# Patient Record
Sex: Female | Born: 1950
Health system: Southern US, Community
[De-identification: ages and names within clinical notes are randomized; demographics above are authoritative.]

## PROBLEM LIST (undated history)

## (undated) DIAGNOSIS — J309 Allergic rhinitis, unspecified: Secondary | ICD-10-CM

## (undated) DIAGNOSIS — J9600 Acute respiratory failure, unspecified whether with hypoxia or hypercapnia: Secondary | ICD-10-CM

## (undated) DIAGNOSIS — M545 Low back pain, unspecified: Secondary | ICD-10-CM

## (undated) DIAGNOSIS — C50919 Malignant neoplasm of unspecified site of unspecified female breast: Secondary | ICD-10-CM

## (undated) DIAGNOSIS — J189 Pneumonia, unspecified organism: Secondary | ICD-10-CM

## (undated) DIAGNOSIS — L409 Psoriasis, unspecified: Secondary | ICD-10-CM

## (undated) DIAGNOSIS — Z720 Tobacco use: Secondary | ICD-10-CM

## (undated) DIAGNOSIS — D649 Anemia, unspecified: Secondary | ICD-10-CM

## (undated) DIAGNOSIS — R600 Localized edema: Secondary | ICD-10-CM

## (undated) DIAGNOSIS — J449 Chronic obstructive pulmonary disease, unspecified: Secondary | ICD-10-CM

## (undated) DIAGNOSIS — H3530 Unspecified macular degeneration: Secondary | ICD-10-CM

## (undated) DIAGNOSIS — M1990 Unspecified osteoarthritis, unspecified site: Secondary | ICD-10-CM

## (undated) DIAGNOSIS — G8929 Other chronic pain: Secondary | ICD-10-CM

## (undated) DIAGNOSIS — R635 Abnormal weight gain: Secondary | ICD-10-CM

## (undated) DIAGNOSIS — I10 Essential (primary) hypertension: Secondary | ICD-10-CM

## (undated) DIAGNOSIS — R5383 Other fatigue: Secondary | ICD-10-CM

## (undated) DIAGNOSIS — I82409 Acute embolism and thrombosis of unspecified deep veins of unspecified lower extremity: Secondary | ICD-10-CM

## (undated) DIAGNOSIS — E8809 Other disorders of plasma-protein metabolism, not elsewhere classified: Secondary | ICD-10-CM

## (undated) HISTORY — PX: APPENDECTOMY: SHX54

## (undated) HISTORY — PX: TUBAL LIGATION: SHX77

## (undated) HISTORY — PX: JOINT REPLACEMENT: SHX530

## (undated) HISTORY — PX: KNEE ARTHROSCOPY: SHX127

## (undated) HISTORY — DX: Psoriasis, unspecified: L40.9

## (undated) HISTORY — PX: TONSILLECTOMY: SUR1361

## (undated) HISTORY — DX: Unspecified macular degeneration: H35.30

---

## 1993-09-13 HISTORY — PX: TOTAL KNEE ARTHROPLASTY: SHX125

## 1995-09-14 DIAGNOSIS — C50919 Malignant neoplasm of unspecified site of unspecified female breast: Secondary | ICD-10-CM

## 1995-09-14 HISTORY — DX: Malignant neoplasm of unspecified site of unspecified female breast: C50.919

## 1995-09-14 HISTORY — PX: BREAST LUMPECTOMY: SHX2

## 1998-07-07 ENCOUNTER — Encounter
Admission: RE | Admit: 1998-07-07 | Discharge: 1998-10-05 | Payer: Self-pay | Admitting: Physical Medicine & Rehabilitation

## 1998-08-13 ENCOUNTER — Encounter: Payer: Self-pay | Admitting: Family Medicine

## 1998-08-13 ENCOUNTER — Ambulatory Visit (HOSPITAL_COMMUNITY): Admission: RE | Admit: 1998-08-13 | Discharge: 1998-08-13 | Payer: Self-pay | Admitting: Family Medicine

## 1998-08-25 ENCOUNTER — Encounter
Admission: RE | Admit: 1998-08-25 | Discharge: 1998-11-23 | Payer: Self-pay | Admitting: Physical Medicine & Rehabilitation

## 1998-12-09 ENCOUNTER — Ambulatory Visit (HOSPITAL_COMMUNITY): Admission: RE | Admit: 1998-12-09 | Discharge: 1998-12-09 | Payer: Self-pay | Admitting: *Deleted

## 1998-12-09 ENCOUNTER — Encounter: Payer: Self-pay | Admitting: *Deleted

## 1999-08-07 ENCOUNTER — Ambulatory Visit (HOSPITAL_COMMUNITY): Admission: RE | Admit: 1999-08-07 | Discharge: 1999-08-07 | Payer: Self-pay | Admitting: *Deleted

## 1999-08-07 ENCOUNTER — Encounter: Payer: Self-pay | Admitting: *Deleted

## 2000-02-02 ENCOUNTER — Emergency Department (HOSPITAL_COMMUNITY): Admission: EM | Admit: 2000-02-02 | Discharge: 2000-02-03 | Payer: Self-pay | Admitting: *Deleted

## 2000-02-03 ENCOUNTER — Encounter: Payer: Self-pay | Admitting: Emergency Medicine

## 2000-02-24 ENCOUNTER — Ambulatory Visit (HOSPITAL_COMMUNITY): Admission: RE | Admit: 2000-02-24 | Discharge: 2000-02-24 | Payer: Self-pay | Admitting: *Deleted

## 2000-02-24 ENCOUNTER — Encounter: Payer: Self-pay | Admitting: *Deleted

## 2000-08-25 ENCOUNTER — Other Ambulatory Visit: Admission: RE | Admit: 2000-08-25 | Discharge: 2000-08-25 | Payer: Self-pay | Admitting: *Deleted

## 2001-03-31 ENCOUNTER — Encounter: Payer: Self-pay | Admitting: *Deleted

## 2001-03-31 ENCOUNTER — Ambulatory Visit (HOSPITAL_COMMUNITY): Admission: RE | Admit: 2001-03-31 | Discharge: 2001-03-31 | Payer: Self-pay | Admitting: *Deleted

## 2001-12-05 ENCOUNTER — Other Ambulatory Visit: Admission: RE | Admit: 2001-12-05 | Discharge: 2001-12-05 | Payer: Self-pay | Admitting: *Deleted

## 2002-01-23 ENCOUNTER — Ambulatory Visit (HOSPITAL_COMMUNITY): Admission: RE | Admit: 2002-01-23 | Discharge: 2002-01-23 | Payer: Self-pay | Admitting: Cardiology

## 2002-01-23 ENCOUNTER — Encounter: Payer: Self-pay | Admitting: Cardiology

## 2002-08-17 ENCOUNTER — Ambulatory Visit (HOSPITAL_COMMUNITY): Admission: RE | Admit: 2002-08-17 | Discharge: 2002-08-17 | Payer: Self-pay | Admitting: *Deleted

## 2002-08-17 ENCOUNTER — Encounter: Payer: Self-pay | Admitting: *Deleted

## 2002-11-26 ENCOUNTER — Encounter: Admission: RE | Admit: 2002-11-26 | Discharge: 2003-02-24 | Payer: Self-pay | Admitting: *Deleted

## 2002-11-29 ENCOUNTER — Encounter: Admission: RE | Admit: 2002-11-29 | Discharge: 2002-11-29 | Payer: Self-pay | Admitting: *Deleted

## 2002-12-04 ENCOUNTER — Encounter: Admission: RE | Admit: 2002-12-04 | Discharge: 2002-12-04 | Payer: Self-pay | Admitting: *Deleted

## 2003-01-12 HISTORY — PX: ROUX-EN-Y GASTRIC BYPASS: SHX1104

## 2003-01-30 ENCOUNTER — Ambulatory Visit: Admission: RE | Admit: 2003-01-30 | Discharge: 2003-01-30 | Payer: Self-pay | Admitting: *Deleted

## 2003-02-04 ENCOUNTER — Ambulatory Visit (HOSPITAL_COMMUNITY): Admission: RE | Admit: 2003-02-04 | Discharge: 2003-02-04 | Payer: Self-pay | Admitting: *Deleted

## 2003-02-05 ENCOUNTER — Inpatient Hospital Stay (HOSPITAL_COMMUNITY): Admission: RE | Admit: 2003-02-05 | Discharge: 2003-02-07 | Payer: Self-pay | Admitting: *Deleted

## 2003-02-19 ENCOUNTER — Ambulatory Visit (HOSPITAL_COMMUNITY): Admission: RE | Admit: 2003-02-19 | Discharge: 2003-02-19 | Payer: Self-pay | Admitting: *Deleted

## 2003-02-25 ENCOUNTER — Ambulatory Visit (HOSPITAL_COMMUNITY): Admission: RE | Admit: 2003-02-25 | Discharge: 2003-02-25 | Payer: Self-pay | Admitting: *Deleted

## 2003-05-28 ENCOUNTER — Encounter: Admission: RE | Admit: 2003-05-28 | Discharge: 2003-08-26 | Payer: Self-pay | Admitting: *Deleted

## 2003-07-15 ENCOUNTER — Other Ambulatory Visit: Admission: RE | Admit: 2003-07-15 | Discharge: 2003-07-15 | Payer: Self-pay | Admitting: *Deleted

## 2003-08-21 ENCOUNTER — Ambulatory Visit (HOSPITAL_COMMUNITY): Admission: RE | Admit: 2003-08-21 | Discharge: 2003-08-21 | Payer: Self-pay | Admitting: *Deleted

## 2004-05-04 ENCOUNTER — Ambulatory Visit (HOSPITAL_COMMUNITY): Admission: RE | Admit: 2004-05-04 | Discharge: 2004-05-04 | Payer: Self-pay | Admitting: Orthopaedic Surgery

## 2004-08-27 ENCOUNTER — Inpatient Hospital Stay (HOSPITAL_COMMUNITY): Admission: RE | Admit: 2004-08-27 | Discharge: 2004-08-31 | Payer: Self-pay | Admitting: Orthopaedic Surgery

## 2005-03-30 ENCOUNTER — Other Ambulatory Visit: Admission: RE | Admit: 2005-03-30 | Discharge: 2005-03-30 | Payer: Self-pay | Admitting: *Deleted

## 2005-06-17 ENCOUNTER — Ambulatory Visit (HOSPITAL_COMMUNITY): Admission: RE | Admit: 2005-06-17 | Discharge: 2005-06-17 | Payer: Self-pay | Admitting: *Deleted

## 2005-06-28 ENCOUNTER — Encounter: Admission: RE | Admit: 2005-06-28 | Discharge: 2005-06-28 | Payer: Self-pay | Admitting: *Deleted

## 2006-08-01 ENCOUNTER — Ambulatory Visit (HOSPITAL_COMMUNITY): Admission: RE | Admit: 2006-08-01 | Discharge: 2006-08-01 | Payer: Self-pay | Admitting: *Deleted

## 2006-10-21 ENCOUNTER — Emergency Department (HOSPITAL_COMMUNITY): Admission: EM | Admit: 2006-10-21 | Discharge: 2006-10-21 | Payer: Self-pay | Admitting: Emergency Medicine

## 2007-08-25 ENCOUNTER — Ambulatory Visit (HOSPITAL_COMMUNITY): Admission: RE | Admit: 2007-08-25 | Discharge: 2007-08-25 | Payer: Self-pay | Admitting: *Deleted

## 2008-05-07 ENCOUNTER — Other Ambulatory Visit: Admission: RE | Admit: 2008-05-07 | Discharge: 2008-05-07 | Payer: Self-pay | Admitting: Gynecology

## 2008-09-23 ENCOUNTER — Ambulatory Visit (HOSPITAL_COMMUNITY): Admission: RE | Admit: 2008-09-23 | Discharge: 2008-09-23 | Payer: Self-pay | Admitting: Gynecology

## 2008-12-06 ENCOUNTER — Ambulatory Visit: Payer: Self-pay | Admitting: Family Medicine

## 2008-12-06 DIAGNOSIS — B070 Plantar wart: Secondary | ICD-10-CM | POA: Insufficient documentation

## 2008-12-06 DIAGNOSIS — Z853 Personal history of malignant neoplasm of breast: Secondary | ICD-10-CM | POA: Insufficient documentation

## 2008-12-06 DIAGNOSIS — L408 Other psoriasis: Secondary | ICD-10-CM | POA: Insufficient documentation

## 2009-08-14 ENCOUNTER — Ambulatory Visit: Payer: Self-pay | Admitting: Family Medicine

## 2009-08-14 DIAGNOSIS — F17200 Nicotine dependence, unspecified, uncomplicated: Secondary | ICD-10-CM | POA: Insufficient documentation

## 2009-08-14 DIAGNOSIS — I10 Essential (primary) hypertension: Secondary | ICD-10-CM | POA: Insufficient documentation

## 2009-08-14 DIAGNOSIS — R635 Abnormal weight gain: Secondary | ICD-10-CM

## 2009-08-14 DIAGNOSIS — J309 Allergic rhinitis, unspecified: Secondary | ICD-10-CM | POA: Insufficient documentation

## 2009-08-14 HISTORY — DX: Abnormal weight gain: R63.5

## 2009-08-14 LAB — CONVERTED CEMR LAB
BUN: 13 mg/dL (ref 6–23)
CO2: 27 meq/L (ref 19–32)
Calcium: 8.9 mg/dL (ref 8.4–10.5)
Chloride: 103 meq/L (ref 96–112)
Cholesterol: 204 mg/dL — ABNORMAL HIGH (ref 0–200)
Creatinine, Ser: 0.66 mg/dL (ref 0.40–1.20)
Glucose, Bld: 86 mg/dL (ref 70–99)
HCT: 41.4 % (ref 36.0–46.0)
HDL: 55 mg/dL (ref >39)
Hemoglobin: 13 g/dL (ref 12.0–15.0)
LDL Cholesterol: 123 mg/dL — ABNORMAL HIGH (ref 0–99)
MCHC: 31.4 g/dL (ref 30.0–36.0)
MCV: 90 fL (ref 78.0–100.0)
Platelets: 241 10*3/uL (ref 150–400)
Potassium: 4.2 meq/L (ref 3.5–5.3)
RBC: 4.6 M/uL (ref 3.87–5.11)
RDW: 14.6 % (ref 11.5–15.5)
Sodium: 142 meq/L (ref 135–145)
TSH: 2.427 microintl units/mL (ref 0.350–4.500)
Total CHOL/HDL Ratio: 3.7
Triglycerides: 128 mg/dL (ref <150)
VLDL: 26 mg/dL (ref 0–40)
WBC: 8 10*3/uL (ref 4.0–10.5)

## 2009-08-15 ENCOUNTER — Encounter: Payer: Self-pay | Admitting: Family Medicine

## 2010-10-15 NOTE — Assessment & Plan Note (Signed)
Summary: new pt/AC   Vital Signs:  Patient profile:   60 year old female Height:      65 inches Weight:      225.8 pounds BMI:     37.71 Pulse rate:   86 / minute Pulse rhythm:   regular BP sitting:   154 / 90  (right arm)  Vitals Entered By: Modesta Messing LPN (December 06, 2008 4:30 PM) Is Patient Diabetic? No Pain Assessment Patient in pain? no        History of Present Illness: Patient new to our office, comes in with complaint of two to three months of Right heel pain.  Initially wondered if she had stepped on something, does not recall ever having noticing foreign body.  No recollection of stepping on glass.  Never suppurated, never developed erythema or warmth.  Does cause pain to weight-bear on the R heel, affects her gait and is causing her to develop some R knee pain when she walks.   Also with painless knot on the L palm, does not limit her use of L hand. She is Right-handed.  No pain associated with this.  Suffered a hand injury last year, unsure if this finding is associated with that injury.   Other history obtained by RN at intake is noted and reviewed, verified with patient.   Social Hx; Works at Technical brewer at [employer].   Habits & Providers     Tobacco Status: current     Cigarette Packs/Day: 0.5  Social History:    Packs/Day:  0.5  Physical Exam  General:  Well-developed,well-nourished,in no acute distress; alert,appropriate and cooperative throughout examination Extremities:  Feet with bilateral dorsalis pedis pulses, sensation grossly intact.  Anterior aspect of right heel with 1-2cm diameter area of thickened calloused skin with keratotic center that is tender to palpation.  No erythema, no visible foreign body. Full ROM active and passive of ankle, full inversion and eversion.    Left hand: palmar fascia along base of ring finger with BB-sized nontender indurated area, no erythema.  Does not move with digit flexion/extension. Brisk cap refill  and normal sensation in all digits.  Nonblanching patchy erythema over MCP/PIP joints of both hands.   Impression & Recommendations:  Problem # 1:  PLANTAR WART, RIGHT (AOZ-308.65) Patient with hyperkeratotic lesion on R heel that appears consistent with a plantar wart.  There is nothing by history of physical findings that make me think this is a foreign body.  I have applied cryo to the area for a total of about 2 minutes today; she is to file the area nightly and apply salicylic acid 17% to the lesion nightly, then cover up with tape. May use a heel cup and cushioned comfortable shoes in the meantime.  I suppose that an x-ray to look for radioopaque foreign body could be considered if no improvement with this treatment plan.   Complete Medication List: 1)  Vitamin C Cr 1000 Mg Cr-tabs (Ascorbic acid) .Marland Kitchen.. 1 tablet by mouth once daily 2)  Calcium 600 600 Mg Tabs (Calcium carbonate) .Marland Kitchen.. 1 tablet by mouth once daily 3)  Salicylic Acid 17 % Soln (Salicylic acid) .... Sig: apply to affected area of right heel once daily at bedtime; cover with tape and then remove in the morning disp: one bottle Prescriptions: SALICYLIC ACID 17 % SOLN (SALICYLIC ACID) SIG: Apply to affected area of right heel once daily at bedtime; cover with tape and then remove in the morning DISP: one bottle  #  1 x 1   Entered and Authorized by:   Paula Compton MD   Signed by:   Paula Compton MD on 12/06/2008   Method used:   Electronically to        [facility]* (retail)       [address]       [address] Shipping/mailing       [place], [place]  [address]       Ph: [phone]       Fax: [phone]   RxID:   [id number]

## 2010-10-15 NOTE — Assessment & Plan Note (Signed)
Summary: new pt/AC  Nurse Visit     Past History:  Past Medical History:    Blood clot - left leg 1997    Breast cancer, hx of-1997  Past Surgical History:    Lumpectomy- left breast - 1997    Appendectomy-1964    Gastric Bypass - 2004    Total knee replacement- left knee 2005    Tonsillectomy-1955    Tubal ligation-1982   Family History:    Family History Lung cancer- mother age 62    Family History of Prostate CA 1st degree relative- father age 5  Social History:    Pt is divorced, lives with her mother and brother.  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  Pt has 2 dogs 3 cats, enjoys swimming and reading.  Smokes cigarettes, alcohol 1 time or less per month, and no illicit drugs.    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 Use:  yes    Smoking Status:  current    Sex Orientation:  Heterosexual    HIV Risk:  no risk noted    Hepatitis Risk:  no risk noted    STD Risk:  no risk noted    Drug Use:  never    Sun Exposure-Excessive:  infrequent    Does Patient Exercise:  yes   Habits & Providers     Alcohol drinks/day: 0     Tobacco Status: current     Year Started: 1968     Does Patient Exercise: yes     Type of exercise: walk, swim, exericse machines     Times/week: <3     STD Risk: no risk noted     Drug Use: never     Seat Belt Use: yes     Sun Exposure: infrequent      Orders Added: 1)  No Charge Patient Arrived (NCPA0) [NCPA0]    ]

## 2010-10-15 NOTE — Letter (Signed)
Summary: Generic Letter  [facility]  [address]   [place], [place] [address]   Phone: [phone]  Fax: [phone]    08/14/2009  [name] [address] [place], [place]  [address]  To Whom It May Concern,  [name] was seen in the office today for medical reasons.  She does not pose an infectious risk to co-workers or family members, and she may continue to work without limitations.          Sincerely,   Paula Compton MD

## 2010-10-15 NOTE — Assessment & Plan Note (Signed)
Summary: sinus/cough,df   Vital Signs:  Patient profile:   60 year old female Height:      65 inches Weight:      232 pounds BMI:     38.75 Temp:     98 degrees F oral Pulse rate:   85 / minute BP sitting:   156 / 95  Vitals Entered By: Tessie Fass CMA (August 14, 2009 8:43 AM)  Serial Vital Signs/Assessments:  Time      Position  BP       Pulse  Resp  Temp     By                     144/82                         Paula Compton MD  CC: sinus drainage and cough x 2months Is Patient Diabetic? No Pain Assessment Patient in pain? no        CC:  sinus drainage and cough x 2months.  History of Present Illness: Patient with postnasal drip and cough since clearing a URI in end-Sept (2 months ago).  No fevers or chills, does feel some nasal congestion.  Does not feel ill.  Coworkers are concerned because they complain she is always coughing.  She denies any hoarseness, denies seeing blood in nasal secretions or with any sputum. OTC DayQuil and NyQuil not helpful.  Has not taken any antihistamines. Warm room temp makes worse, cold temp makes better.   She has psoriasis, which is usually worse this time of year. Actually has gotten better since her cat died 4 weeks ago (was 48 yrs old!)  Still smoking about 1/3 to 1/2 packs cigarettes daily.  Biggest impetus to quit would be the cost; admits that she finds smoking a good stress reliever for her.    Concerned about her recent weight gain of 8 lbs since last visit.  Remarks that she has been under a lot of stress, ate well at Thanksgiving last week.    Screening: Last PAP and mammo done about 10 months ago by Dr Ala Dach.  She has plans to be seen in his practice at the 1-yr anniversary of last visit.  Both studies are normal per her account.  Has never had colon cancer screening.  Discussed pros and cons of colonoscopy every 10 yrs versus annual stool cards, with high false-positive rate.  She would prefer to have the stool cards now,  understands that any positive would trigger recommendation to have colonoscopy.   Remarks that her plantar wart from this Spring completely resolved with the tape.   Habits & Providers  Alcohol-Tobacco-Diet     Tobacco Status: current     Cigarette Packs/Day: 0.5  Current Medications (verified): 1)  Vitamin C Cr 1000 Mg Cr-Tabs (Ascorbic Acid) .Marland Kitchen.. 1 Tablet By Mouth Once Daily 2)  Calcium 600 600 Mg Tabs (Calcium Carbonate) .Marland Kitchen.. 1 Tablet By Mouth Once Daily 3)  Flonase 50 Mcg/act Susp (Fluticasone Propionate) .... 2 Sprays/nostril Once Daily Disp 1 Unit 4)  Zyrtec Allergy 10 Mg Tabs (Cetirizine Hcl) .... Sig Take 1 Tab By Mouth Once Daily Generic Okay  Allergies (verified): No Known Drug Allergies  Physical Exam  General:  well appearing, no apparent distress Eyes:  Mild conjunctival injection bilat (L worse than R). Ears:  TMS clear bilaterally.  Nose:  Thin watery nasal discharge. No maxillary or frontal sinus tenderness.  Mouth:  Clear oropharynx, no exudates.  Neck:  No anterior cervical adenopathy.  Lungs:  Clear lung fields bilaterally.  Heart:  Regular S1S2, no extra sounds. Questionable 1/6 early systolic M heard at R base. Extremities:  Erythematous psoriatic plaques over dorsum of both hands, without scales.    Impression & Recommendations:  Problem # 1:  ALLERGIC RHINITIS CAUSE UNSPECIFIED (ICD-477.9) Patient with one yearly episode of what appears to be allergic rhinitis. Nothing by hx or exam to  suggest infectious sinusitis.  Will treat with nasal steroid, nonsedating antihistamine. She may stop and start these at will.  If no improvement, to contact our office.  Smoking cessation recommended regarding this problem.  Note for work at patient;s request. Her updated medication list for this problem includes:    Flonase 50 Mcg/act Susp (Fluticasone propionate) .Marland Kitchen... 2 sprays/nostril once daily disp 1 unit    Zyrtec Allergy 10 Mg Tabs (Cetirizine hcl) ..... Sig take 1  tab by mouth once daily generic okay  Orders: [facility]- Est  Level 4 ([address])  Problem # 2:  Preventive Health Care (ICD-V70.0) stool cards annually. Patient given option between annual stool cards with their high false-positive rate, versus colonoscopy.  No family hx of colorectal cancer. Negative GI ROS (no diarrhea, stool changes).   PAP and mammography done by Gyn Dr. Ala Dach, done last about 10 months ago, due to go again soon. I asked her to request copies of those studies to be sent to our office.   Problem # 3:  ELEVATED BP READING WITHOUT DX HYPERTENSION (ICD-796.2) Patient without prior diagnosis of hypertension.   Has had some mild weight gain (7 lb since last visit).  Discussed this in context of weight gain, chronic low back pain.  To follow BP at next visit. Orders: Basic Met-[facility] [phone]) Lipid-[facility] ([id number]) CBC-[facility] ([id number]) [facility]- Est  Level 4 ([address])  Problem # 4:  ABNORMAL WEIGHT GAIN (ICD-783.1) Labs for screening as indicated.  Discussed weight loss.  Orders: Basic Met-[facility] [id number]) Lipid-[facility] [id number]) CBC-[facility] ([address]) TSH-[facility] [phone]) [facility]- Est  Level 4 ([address])  Problem # 5:  TOBACCO ABUSE (ICD-305.1) Patient currently smokes about 5-10 cigarettes daily.  Appears to be pre-contemplative regarding quitting ("I like smoking; it helps me relax").  Major downside for her is the increasing cost of cigarettes.  Discussed impact on her rhinitis, as risk factor for other chronic illness.  Offered free smoking cessation program here in [facility].  She is aware of it.  Will readdress.  Complete Medication List: 1)  Vitamin C Cr 1000 Mg Cr-tabs (Ascorbic acid) .Marland Kitchen.. 1 tablet by mouth once daily 2)  Calcium 600 600 Mg Tabs (Calcium carbonate) .Marland Kitchen.. 1 tablet by mouth once daily 3)  Flonase 50 Mcg/act Susp (Fluticasone propionate) .... 2 sprays/nostril once daily disp 1 unit 4)  Zyrtec Allergy 10 Mg Tabs (Cetirizine hcl) .... Sig take 1 tab by mouth once daily generic okay  Prescriptions: ZYRTEC ALLERGY 10 MG TABS (CETIRIZINE HCL) SIG Take 1 tab by mouth once daily Generic okay  #30 x 12   Entered and Authorized by:   Paula Compton MD   Signed by:   Paula Compton MD on 08/14/2009   Method used:   Electronically to        [facility]* (retail)       [address]       [address] Shipping/mailing       [place], [place]  [address]  Ph: [phone]       Fax: [phone]   RxID:   [id number] FLONASE 50 MCG/ACT SUSP (FLUTICASONE PROPIONATE) 2 sprays/nostril once daily DISP 1 unit  #1 x 6   Entered and Authorized by:   Paula Compton MD   Signed by:   Paula Compton MD on 08/14/2009   Method used:   Electronically to        [facility]* (retail)       [address]       [address] Shipping/mailing       [place], [place]  [address]       Ph: [phone]       Fax: [phone]   RxID:   [id number]

## 2010-10-15 NOTE — Letter (Signed)
Summary: Generic Letter  [facility]  [address]   [place], [place] [address]   Phone: [phone]  Fax: [phone]    08/15/2009  SHOSHANA JOHAL [address] [place], [place]  [address]  Dear Ms. Atkin,   It was a pleasure to see you yesterday in the office.  I have the pleasure of informing you that your laboratory tests done yesterday are all within normal limits (thyroid testing, blood count, kidney function, and cholesterol panel).  I am enclosing copies of the results with this letter.   As for the cholesterol panel, your "good" HDL cholesterol is high (55); this is GOOD.  The triglycerides and LDL ("bad") cholesterol are both within acceptable limits for patients with your profile (an LDL less than 130 is acceptable, as you do not have a diagnosis of high blood pressure or diabetes).  For weight loss, I recommend increasing the amount of fiber in your diet, decreasing fats and free sugars (sweets, sodas), and maintaining an active lifestyle (recommendation of 150 minutes of physical activity per week, or 30 minutes five times a week).    Sincerely,   Paula Compton MD  Appended Document: Generic Letter mailed.

## 2010-10-29 ENCOUNTER — Encounter: Payer: Self-pay | Admitting: *Deleted

## 2011-01-04 ENCOUNTER — Encounter: Payer: Self-pay | Admitting: Family Medicine

## 2011-01-04 ENCOUNTER — Ambulatory Visit (INDEPENDENT_AMBULATORY_CARE_PROVIDER_SITE_OTHER): Payer: PRIVATE HEALTH INSURANCE | Admitting: Family Medicine

## 2011-01-04 VITALS — BP 171/79 | HR 94 | Temp 98.1°F | Ht 65.0 in | Wt 271.0 lb

## 2011-01-04 DIAGNOSIS — F17200 Nicotine dependence, unspecified, uncomplicated: Secondary | ICD-10-CM

## 2011-01-04 DIAGNOSIS — M1990 Unspecified osteoarthritis, unspecified site: Secondary | ICD-10-CM

## 2011-01-04 DIAGNOSIS — R030 Elevated blood-pressure reading, without diagnosis of hypertension: Secondary | ICD-10-CM

## 2011-01-04 DIAGNOSIS — R635 Abnormal weight gain: Secondary | ICD-10-CM

## 2011-01-04 LAB — COMPREHENSIVE METABOLIC PANEL
ALT: 9 U/L (ref 0–35)
AST: 15 U/L (ref 0–37)
Albumin: 3.6 g/dL (ref 3.5–5.2)
Alkaline Phosphatase: 109 U/L (ref 39–117)
BUN: 13 mg/dL (ref 6–23)
CO2: 33 mEq/L — ABNORMAL HIGH (ref 19–32)
Calcium: 8.2 mg/dL — ABNORMAL LOW (ref 8.4–10.5)
Chloride: 101 mEq/L (ref 96–112)
Creat: 0.49 mg/dL (ref 0.40–1.20)
Glucose, Bld: 86 mg/dL (ref 70–99)
Potassium: 3.8 mEq/L (ref 3.5–5.3)
Sodium: 142 mEq/L (ref 135–145)
Total Bilirubin: 0.6 mg/dL (ref 0.3–1.2)
Total Protein: 6.1 g/dL (ref 6.0–8.3)

## 2011-01-04 LAB — LIPID PANEL
Cholesterol: 144 mg/dL (ref 0–200)
HDL: 31 mg/dL — ABNORMAL LOW (ref >39)
LDL Cholesterol: 96 mg/dL (ref 0–99)
Total CHOL/HDL Ratio: 4.6 Ratio
Triglycerides: 87 mg/dL (ref <150)
VLDL: 17 mg/dL (ref 0–40)

## 2011-01-04 LAB — TSH: TSH: 3.086 u[IU]/mL (ref 0.350–4.500)

## 2011-01-04 MED ORDER — HYDROCHLOROTHIAZIDE 12.5 MG PO TABS: 12.5000 mg | ORAL_TABLET | Freq: Every day | ORAL | Status: DC

## 2011-01-04 NOTE — Assessment & Plan Note (Signed)
Patient comes across as this is a new problem. However, it was listed as a problem with her visit in 2010, she has gastric bypass surgery in 2004.  Seems to lack insight into her poor health.

## 2011-01-04 NOTE — Assessment & Plan Note (Signed)
Begin HCTZ at 12.5 mg daily, follow up with primary MD

## 2011-01-04 NOTE — Assessment & Plan Note (Signed)
Counseled to quit, not pre-contemplative regarding such.

## 2011-01-04 NOTE — Progress Notes (Signed)
  Subjective:    Patient ID: [name], female    DOB: [birth year], 60 y.o.   MRN: [id number]  HPIPatient describes abnormal weight gain, this was also a complaint one year ago.  PMH significant for gastric bypass surgery in 2004 for weight loss.  She denies eating bad food but does endorse eating a lot of food.  She was established as a new patient with Dr. Mauricio Po two years ago, was only seen twice in that year.  When asked why she does not come in, she stated I was fine and did not needs anything.   She has been checking her blood pressure and it has been elevated in the 170 range,  On routine eye exam [facility]) two years ago found to have macular degeneration on the right, at her last visit she was started on Rifampin 300 mg daily (apparenlty a new treatment).  Today she has a script from her opthalmologic to have liver function tests checked.  She is limited in her mobility because of back pain and osteoarthritis of her knees. She had a TKR in the late 90s on the left.  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   Review of Systems  Constitutional: Positive for fatigue and unexpected weight change.  HENT:       Sound of a pulse in ears  Respiratory: Positive for shortness of breath.        With exertion   Cardiovascular: Positive for palpitations and leg swelling. Negative for chest pain.  Genitourinary:       Stress incontinence       Objective:   Physical Exam  Constitutional:       Alert, obese          Assessment & Plan:

## 2011-01-04 NOTE — Assessment & Plan Note (Signed)
Right TKR in late 90's , back DJD per her history, walking with a cane, in very poor physical condition.  Has significant psoriasis.  It will take some time and regular visits to sort this out.

## 2011-01-04 NOTE — Patient Instructions (Signed)
Make apt with Dr. Mauricio Po in the next week, she is assigned to him and has not been in for 2 years I would like to write down everything you eat and drink Begin the low dose water pill, HCTZ for BP control Drink a lot of fresh water Please consider stop smoking

## 2011-01-05 ENCOUNTER — Encounter: Payer: Self-pay | Admitting: Family Medicine

## 2011-01-12 ENCOUNTER — Ambulatory Visit
Admission: RE | Admit: 2011-01-12 | Discharge: 2011-01-12 | Disposition: A | Discharge status: Home / Self Care | Payer: PRIVATE HEALTH INSURANCE | Source: Ambulatory Visit | Attending: Family Medicine | Admitting: Family Medicine

## 2011-01-12 ENCOUNTER — Ambulatory Visit (INDEPENDENT_AMBULATORY_CARE_PROVIDER_SITE_OTHER): Payer: PRIVATE HEALTH INSURANCE | Admitting: Family Medicine

## 2011-01-12 ENCOUNTER — Encounter: Payer: Self-pay | Admitting: Family Medicine

## 2011-01-12 VITALS — BP 145/73 | HR 106 | Temp 98.3°F | Ht 65.0 in | Wt 263.0 lb

## 2011-01-12 DIAGNOSIS — R0602 Shortness of breath: Secondary | ICD-10-CM | POA: Insufficient documentation

## 2011-01-12 DIAGNOSIS — R0600 Dyspnea, unspecified: Secondary | ICD-10-CM

## 2011-01-12 DIAGNOSIS — R0989 Other specified symptoms and signs involving the circulatory and respiratory systems: Secondary | ICD-10-CM

## 2011-01-12 MED ORDER — FUROSEMIDE 40 MG PO TABS: 40.0000 mg | ORAL_TABLET | Freq: Every day | ORAL | Status: DC

## 2011-01-12 NOTE — Patient Instructions (Addendum)
It was a pleasure to see you today.   I am ordering a chest xray and an echocardiogram (sonogram of your heart) to assess your lungs and your heart function.   Your lab work from last week does not explain your tiredness or cough/shortness of breath.   I would like to see you back in the coming 7 to 10 days.  I sent a new prescription for Lasix 40mg  one time daily, to your pharmacy.  Please stop the HCTZ for now until we see each other next week.   Daily weights at the same time each day!

## 2011-01-12 NOTE — Assessment & Plan Note (Addendum)
Patient with hypertension and smoking history (current smoker), presents with months' of dyspnea, cough with scant sputum, weight gain and LE edema.  Concern for possible CHF with pulmonary edema.  For CXR, ECHO. To start on Lasix 40mg  daily today, for daily weights. For ECG at presentation in the coming 7 to 10 days, also recheck BMet in light of diuresis. Patient is up to date for PAP and bimanual exam; would consider other causes of edema and possible ascites in 60yoF if initial w/u does not reveal CHF.  Discussed smoking cessation and asked patient to consider what she finds enjoyable about smoking.  She is amenable to considering this.

## 2011-01-12 NOTE — Progress Notes (Signed)
  Subjective:    Patient ID: [name], female    DOB: 1951-05-20, 60 y.o.   MRN: [id number]  HPI Patient with history of hypertension, here for complaint of worsening dyspnea and fatigue over the past 4 to 6 months.  Describes onset as coinciding with respiratory illness (had a cold at the same time as granddaughter in Nov 2011), the cough has persisted with scant nonbloody sputum since then.  Does not feel that the cough is from her upper airway.  Has had worsening dyspnea.  Tries to limit salt intake.  Has felt very stressed recently.  Describes feeling tiredness in chest when she exerts herself, does not describe as tightness or pain.  No fevers or chills. Clothes do not fit well over the past several months of weight gain.  Feels she has diuresed a bit since starting on HCTZ 12.5mg  daily last visit with Luretha Murphy.  Labs ordered at that visit are reviewed today with the patient.   PMHx patient s/p surgery and XRT for breast cancer in 1997; has regular mammograms which have been unremarkable per her report. Also with psoriasis in hands and legs.  Relatively quiescent at this time.   Family Hx: Mother living with OA, a lung CA survivor.  No family history of MI or ovarian/cervical cancer.  Has several long-lived relatives (grandfather lived past 100).    Review of Systems  See HPI.      Objective:   Physical Exam  Constitutional:       Alert, speaking in complete sentences.  Appears in mild distress with walking/changing positions in room.   Eyes:       No conjunctival pallor.  PERRL. Clear sclerae  Neck: Neck supple. No JVD present. No thyromegaly present.  Cardiovascular: Normal rate, regular rhythm and normal heart sounds.  Exam reveals no friction rub.   No murmur heard. Pulmonary/Chest: Effort normal. She has rales.       Bibasilar crackles heard on exam; no wheezing or stridor.   Abdominal:       Abdomen obese; no organomegaly or fluid wave.   Musculoskeletal:       2+  bilateral pitting edema which is symmetric.  No open lesions on skin of lower extremities.   Lymphadenopathy:    She has no cervical adenopathy.  Skin:       Psoriatic plaques over knuckles of both hands.  No active synovitis noted.  Full ROM actively in hands, handgrip.           Assessment & Plan:

## 2011-01-13 ENCOUNTER — Telehealth: Payer: Self-pay | Admitting: Family Medicine

## 2011-01-13 NOTE — Telephone Encounter (Signed)
Pt checking status of appt for ECHO

## 2011-01-13 NOTE — Telephone Encounter (Signed)
Called patient to report normal chest x-ray findings.  Left voice message.

## 2011-01-13 NOTE — Telephone Encounter (Signed)
Called patient and informed of appointment for 2D Echo at [facility] on 01/26/11 at 1:00. Patient states she would like an earlier appointment before her follow up visit with Dr Mauricio Po. I explained that was the next available appointment they had but I would try and reschedule and give her a call back this evening.Busick, Rodena Medin

## 2011-01-15 NOTE — Telephone Encounter (Signed)
Left message for patient to return call. Please inform patient that her appointment for the 2 D echo has been changed to 01/20/11 at 9 am.[name], [name]

## 2011-01-20 ENCOUNTER — Encounter: Payer: Self-pay | Admitting: Family Medicine

## 2011-01-20 ENCOUNTER — Ambulatory Visit (HOSPITAL_COMMUNITY)
Admission: RE | Admit: 2011-01-20 | Discharge: 2011-01-20 | Disposition: A | Discharge status: Home / Self Care | Payer: PRIVATE HEALTH INSURANCE | Source: Ambulatory Visit | Attending: Family Medicine | Admitting: Family Medicine

## 2011-01-20 ENCOUNTER — Ambulatory Visit: Payer: PRIVATE HEALTH INSURANCE | Admitting: Family Medicine

## 2011-01-20 ENCOUNTER — Other Ambulatory Visit: Payer: Self-pay

## 2011-01-20 ENCOUNTER — Ambulatory Visit (HOSPITAL_COMMUNITY): Payer: No Typology Code available for payment source | Attending: Family Medicine

## 2011-01-20 ENCOUNTER — Ambulatory Visit (INDEPENDENT_AMBULATORY_CARE_PROVIDER_SITE_OTHER): Payer: PRIVATE HEALTH INSURANCE | Admitting: Family Medicine

## 2011-01-20 VITALS — BP 124/77 | HR 101 | Ht 65.0 in | Wt 252.0 lb

## 2011-01-20 DIAGNOSIS — F17200 Nicotine dependence, unspecified, uncomplicated: Secondary | ICD-10-CM | POA: Insufficient documentation

## 2011-01-20 DIAGNOSIS — I10 Essential (primary) hypertension: Secondary | ICD-10-CM | POA: Insufficient documentation

## 2011-01-20 DIAGNOSIS — I369 Nonrheumatic tricuspid valve disorder, unspecified: Secondary | ICD-10-CM

## 2011-01-20 DIAGNOSIS — R0600 Dyspnea, unspecified: Secondary | ICD-10-CM

## 2011-01-20 DIAGNOSIS — R011 Cardiac murmur, unspecified: Secondary | ICD-10-CM | POA: Insufficient documentation

## 2011-01-20 DIAGNOSIS — R0989 Other specified symptoms and signs involving the circulatory and respiratory systems: Secondary | ICD-10-CM | POA: Insufficient documentation

## 2011-01-20 DIAGNOSIS — I079 Rheumatic tricuspid valve disease, unspecified: Secondary | ICD-10-CM | POA: Insufficient documentation

## 2011-01-20 LAB — BASIC METABOLIC PANEL
BUN: 18 mg/dL (ref 6–23)
CO2: 31 mEq/L (ref 19–32)
Calcium: 8.1 mg/dL — ABNORMAL LOW (ref 8.4–10.5)
Chloride: 101 mEq/L (ref 96–112)
Creat: 0.64 mg/dL (ref 0.40–1.20)
Glucose, Bld: 122 mg/dL — ABNORMAL HIGH (ref 70–99)
Potassium: 4.5 mEq/L (ref 3.5–5.3)
Sodium: 144 mEq/L (ref 135–145)

## 2011-01-20 MED ORDER — FUROSEMIDE 40 MG PO TABS: 20.0000 mg | ORAL_TABLET | Freq: Every day | ORAL | Status: DC

## 2011-01-20 NOTE — Patient Instructions (Signed)
It was a pleasure to see you today.  I am glad you are feeling so much better!  Keep up with the bathroom scale at home.   As we discussed, I recommend scaling back to lasix 20mg  (ie, 1/2 tablet of your 40mg  tabs) one time daily for now.  I am checking your labs today, and I will call you [phone] when I get the labs and today's ECHO results back.   I recommend that you take a baby aspirin (81mg ) one time daily to help prevent heart attack and stroke.   I recommend a low-salt diet (less than 2g of added salt a day).  Remember that frozen foods and restaurant-prepared foods contain more salt than home-cooked meals.   I would like to see you back in the coming 2 months, but will be in touch once all other labs and ECHO are back.   2 Gram Low Sodium Diet A 2 gram sodium diet restricts the amount of sodium in the diet to no more than 2 grams (g) or 2000 milligrams (mg) daily. Limiting the amount of sodium is often used to help lower blood pressure. It is important if you have heart, liver, or kidney problems. Many foods contain sodium for flavor and sometimes as a preservative. When the amount of sodium in a diet needs to be low, it is important to know what to look for when choosing foods and drinks. The following includes some information and guidelines to help make it easier for you to adapt to a low sodium diet. QUICK TIPS  Do not add salt to food.   Avoid convenience items and fast food.   Choose unsalted snack foods.   Buy lower sodium products, often labeled as "lower sodium" or "no salt added."   Check food labels to learn how much sodium is in 1 serving.   When eating at a restaurant, ask that your food be prepared with less salt or none, if possible.  READING FOOD LABELS FOR SODIUM INFORMATION The nutrition facts label is a good place to find how much sodium is in foods. Look for products with no more than 500 to 600 mg of sodium per meal and no more than 150 mg per  serving. Remember that 2 g = 2000 mg. The food label may also list foods as:  Sodium-free: Less than 5 mg in a serving.   Very low sodium: 35 mg or less in a serving.   Low-sodium: 140 mg or less in a serving.   Light in sodium: 50% less sodium in a serving. For example, if a food that usually has 300 mg of sodium is changed to become light in sodium, it will have 150 mg of sodium.   Reduced sodium: 25% less sodium in a serving. For example, if a food that usually has 400 mg of sodium is changed to reduced sodium, it will have 300 mg of sodium.  CHOOSING FOODS AVOID  CHOOSE   Grains:  Salted crackers and snack items. Some cereals, including instant hot cereals. Bread stuffing and biscuit mixes. Seasoned rice or pasta mixes.  Grains:  Unsalted snack items. Low-sodium cereals, oats, puffed wheat and rice, shredded wheat. English muffins and bread. Pasta.   Meats:  Salted, canned, smoked, spiced, or pickled meats, including fish and poultry. Bacon, ham, sausage, cold cuts, hot dogs, anchovies.  Meats:  Low-sodium canned tuna and salmon. Fresh or frozen meat, poultry, and fish.   Dairy:  Processed cheese and spreads. Cottage cheese.  Buttermilk and condensed milk. Regular cheese.  Dairy:  Milk. Low-sodium cottage cheese. Yogurt. Sour cream. Low-sodium cheese.   Fruits and Vegetables:  Regular canned vegetables. Regular canned tomato sauce and paste. Frozen vegetables in sauces. Olives. Rosita Fire. Relishes. Sauerkraut.  Fruits and Vegetables:  Low-sodium canned vegetables. Low-sodium tomato sauce and paste. Fresh and frozen vegetables. Fresh and frozen fruit.   Condiments:  Canned and packaged gravies. Worcestershire sauce. Tartar sauce. Barbecue sauce. Soy sauce. Steak sauce. Ketchup. Onion, garlic, and table salt. Meat flavorings and tenderizers.  Condiments:  Fresh and dried herbs and spices. Low-sodium varieties of mustard and ketchup. Lemon juice. Tabasco  sauce. Horseradish.   SAMPLE 2 GRAM SODIUM MEAL PLAN Meal  Foods  Sodium (mg)   Breakfast  1 cup low-fat milk  2 slices whole-wheat toast 1 tablespoon heart-healthy margarine 1 hard-boiled egg 1 small orange  143  [phone] 0   Lunch  1 cup raw carrots   cup hummus 1 cup low-fat milk  cup red grapes 1 whole-wheat pita bread  76  298 143 2 356   Dinner  1 cup whole-wheat pasta  1 cup low-sodium tomato sauce 3 ounces lean ground beef 1 small side salad (1 cup raw spinach leaves,  cup cucumber,  cup yellow bell pepper) with 1 teaspoon olive oil and 1 teaspoon red wine vinegar  2  73 57 25   Snack  1 container low-fat vanilla yogurt  3 graham cracker squares  107  127   Nutrient Analysis Calories: 2033 Protein: 77 g Carbohydrate: 282 g Fat: 72 g Sodium: 1971 mg Document Released: 08/30/2005 Document Re-Released: 02/17/2010 [facility] Patient Information 2011 [place], [place].

## 2011-01-20 NOTE — Assessment & Plan Note (Signed)
Discussed smoking cessation briefly.  She appears pre contemplative.

## 2011-01-20 NOTE — Assessment & Plan Note (Signed)
Patient with clinical improvement with lasix, also with notable (11 lb) weight loss since taking the daily lasix.  My exam reveals no rales today, which were clearly present on my previous exam.  Reviewed CXR and ECG findings today, will recheck BMet today to evaluate renal function and lytes in light of the lasix.  To reduce lasix dose to 20mg  daily.  Ms. Deleo is concerned about the possibility of taking multiple medications.  I have told her that while my clinical suspicion is that she may have some mild heart failure, I want to be careful not to put the cart before the horse.  Therefore, we will await results of the ECHO which she had performed a few hours ago, and at that time I will call her to discuss results by phone.  Her biggest risk factors for CHF are her smoking and hypertension, also overweight/obesity.  Appears precontemplative on the smoking cessation. She denies any family history of heart disease, seems doubtful of CHF as explanation.  Will discuss need for ACEI, B blocker, and possibly K sparing diuretic after results of ECHO are back.  Her cell (only) phone is [phone].

## 2011-01-20 NOTE — Progress Notes (Signed)
  Subjective:    Patient ID: [name], female    DOB: 1951-06-06, 60 y.o.   MRN: [id number]  HPI Ms. Marcantonio comes in today for follow up.  She notes marked improvements in her dyspnea and lower extremity edema since taking Lasix 40mg  one time daily.  "I can actually see my ankle bones!".  No cough or shortness of breath, no chest tightness or pain.  Went for her ECHO this morning, no results available at this time.   She continues to smoke, a pack of cigarettes last her a day to a day-and-a-half.  Reports she used to smoke in excess of a pack a day.   She states she had spirometry done in 2000 when she worked for [employer], concern for exposure to harmful fumes there.  Was told in 2000 that "lungs were better than many people who never smoked!".   Family Hx: No family history of heart disease.    Review of Systems  Denies fevers or chills, no chest pressure or pain, no cough.  Improved energy since last visit.  Reduced edema.  Lost around 20 lbs on her home scale. Reports chronic and worsening low back pain, bilateral knee pain.  Considering applying for disability.     Objective:   Physical Exam Well appearing, alert and pleasant. No apparent distress.  COR RRR, no extra sounds.  PULM Clear bilaterally, no rales or wheezes.  LE: trace ankle edema, marked reduction when compared to last visit's exam.   CXR from Jan 12, 2011 viewed in room with patient; mildly enlarged cardiac silhouette. Question mild fibrotic changes per radiologist reading.       Assessment & Plan:

## 2011-01-21 ENCOUNTER — Telehealth: Payer: Self-pay | Admitting: Family Medicine

## 2011-01-21 NOTE — Telephone Encounter (Signed)
Patient called back to discuss ECHO and lab results.  I informed her of the ECHO and BMet results, and the recommendation for SPirometry testing to look for explanation for her dyspnea.  She is not interested in Spirometry because of cost concerns.  In discussing the cause of her edema, we reviewed her April labs as well, with normal transaminases, albumin and protein, as well as normal Alkaline phosphatase.  Will plan to see her back in about 4-6 weeks and continue with lasix 20mg  daily, daily weight checks at home.  To consider gyn history and menstrual history,

## 2011-01-21 NOTE — Telephone Encounter (Signed)
Called patient to report satisfactory lab results and normal systolic function on ECHO from 5/08.  Left this information on her cell phone voice recorder.  I would like her to schedule for Spirometry with Pharmacy clinic.   Admin Team, please schedule for Spirometry with [facility].  Thanks.

## 2011-01-26 ENCOUNTER — Ambulatory Visit (HOSPITAL_COMMUNITY): Payer: PRIVATE HEALTH INSURANCE

## 2011-01-29 NOTE — Cardiovascular Report (Signed)
NAME:  [name], [name]                          ACCOUNT NO.:  [id number]   MEDICAL RECORD NO.:  [id number]                   PATIENT TYPE:  OIB   LOCATION:  2852                                 FACILITY:  [facility]   PHYSICIAN:  Balinda Quails, M.D.                 DATE OF BIRTH:  Nov 02, 1950   DATE OF PROCEDURE:  02/24/2003  DATE OF DISCHARGE:                              CARDIAC CATHETERIZATION   PHYSICIAN:  Balinda Quails, M.D.   DIAGNOSIS:  History of venous thromboembolism.   PROCEDURES:  1. Inferior vena cavagram.  2. Retrieval of Optease inferior vena cava filter.   ACCESS:  Right common femoral vein, 8 French sheath.   CONTRAST:  70 mL Visipaque.   COMPLICATIONS:  None.   CLINICAL NOTE:  [name] is a 60 year old female with a history of morbid  obesity and venous thromboembolic disease.  She had placement of an Optease  inferior vena cava filter prior to undergoing bariatric surgery.  She has  completed her bariatric surgery and is now ambulatory.  She is brought back  to the catheterization lab at this time for elective removal of her  retrievable filter.   PROCEDURE NOTE:  Patient brought to the catheterization lab in stable  condition.  Placed in the supine position.  Administered 2 mg of Versed  intravenously.  Skin and subcutaneous tissue in the right groin were prepped  and draped and anesthetized with 1% Xylocaine.  A needle was easily  introduced into the right common femoral vein.  A 0.035 J-wire passed  through the needle into the inferior vena cava.  A 5 French sheath was then  advanced over the guidewire.  The dilator removed and the sheath flushed  with heparin-saline solution.  A pigtail catheter was then advanced over the  guidewire to the inferior vena cava.   An inferior vena cavagram was obtained with injection of 40 mL of contrast  solution at 20 mL/sec.  This outlined the position of the retrievable filter  with no evidence of retained clot.   There was no clot evident in the  inferior vena cava.   The pigtail catheter was then removed over the guidewire.  The 5 French  sheath removed and a long 8 [place] sheath advanced over the guidewire.  The  8 French positioned at the inferior margin of the retrievable filter.  The  guidewire was then removed.  A snare then advanced through the sheath.  The  snare was circled on the filter and brought down to engage the hook.  The  sheath was advanced over the filter.  The filter was then removed through  the sheath.   The 8 French sheath was then drawn back into the terminal vena cava.  A  repeat vena cavagram was obtained.  This revealed no evidence of retained  clot or injury to the cava.   The patient tolerated  the procedure well.  The right femoral venous sheath  was removed.  No apparent complications.  The patient was transferred to the  holding area in stable condition.   FINAL IMPRESSION:  1. Successful removal of Optease inferior vena cava filter.  2. No evidence of inferior vena cava injury or thrombus.                                               Balinda Quails, M.D.    PGH/MEDQ  D:  02/25/2003  T:  02/25/2003  Job:  [id number]   cc:   Vikki Ports, M.D.  [address], [address]  [place]  [place] [address]  Fax: [phone]

## 2011-01-29 NOTE — H&P (Signed)
NAME:  [name], [name]              ACCOUNT NO.:  [id number]   MEDICAL RECORD NO.:  [id number]          PATIENT TYPE:  INP   LOCATION:  NA                           FACILITY:  [facility]   PHYSICIAN:  Claude Manges. Whitfield, M.D.DATE OF BIRTH:  Apr 04, 1951   DATE OF ADMISSION:  08/27/2004  DATE OF DISCHARGE:                                HISTORY & PHYSICAL   CHIEF COMPLAINT:  Left knee pain on and off for the last 20 years.   HISTORY OF PRESENT ILLNESS:  This 60 year old white female presented to Dr.  Claude Manges. Whitfield with an intermittent history of knee pain over the last  20 years.  It came on gradually and has been getting progressively worse.  She has had 2 knee arthroscopies in the past, 1 in 1987 and 1 in 1997.  Now  for the last year or so, she has been having gradually increasing left knee  pain.   At this point, the pain is a constant dull-to-very-sharp sensation diffuse  about the knee, but especially over the superolateral aspect of the knee.  The pain also radiates all the way proximally into her hip and then distally  down to her foot.  The pain increases if she sits for a long period of time,  walks for a long period of time or has to do stairs.  Pain decreases with  rest, elevation and heat.  She has been taking Naprosyn as needed for pain  and that provides some relief, in addition to some Vicodin p.r.n.  The knee  does grind at times and locks.  It is always swollen.  It occasionally keeps  her up at night and at one point, she was able to pop the knee in order to  get some relief of the pain; she cannot do that anymore.  She is currently  wearing an open patellar brace to help with the pain.  She has received  cortisone injections in the past and that only helped for about a month.  She has not tired viscous supplementation and she is not ambulating with any  assistive devices.   ALLERGIES:  No known drug allergies.   CURRENT MEDICATIONS:  1.  Multivitamin 1 tablet  p.o. q.a.m.  2.  Vitamin B12 1 tablet p.o. q.a.m.  3.  Vitamin C 1 tablet p.o. q.a.m.   PAST MEDICAL HISTORY:  1.  History of breast cancer, left breast, status post lumpectomy with lymph      node dissection and radiation.  2.  Psoriasis on her left hand and elbows.   She denies any history of diabetes mellitus, hypertension, thyroid disease,  hiatal hernia, reflux, peptic ulcer disease, heart disease, asthma or any  other chronic medical conditions other than noted previously.   PAST SURGICAL HISTORY:  1.  Tonsillectomy in 1954.  2.  Appendectomy in 1964.  3.  Removal of ovarian cyst and adhesions in 1981.  4.  Left knee arthroscopy in 1987.  5.  Left breast lumpectomy and lymph node dissection in 1997.  6.  Left knee arthroscopy, 1997.  7.  Gastric bypass, 2004.  She denies any complications from the above-mentioned procedures.   SOCIAL HISTORY:  She has a 37-pack-year history of cigarette smoking.  She  has currently been smoking only a half a pack a day for the last year and  she is planning on quitting after her surgery.  She does not drink any  alcohol nor use any drugs.  She is divorced and has 3 children.  She lives  by herself in a 1-story house with 2 steps into the main entrance.  Her  medical doctor is Dr. Viviann Spare R. Fore, her OB/GYN.  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   FAMILY HISTORY:  Her mother is alive at age 33 with hypertension and a  history of lung cancer.  Her father is alive at age 23, but she does not  know anything about his medical history.  She has 1 brother, age 20, who is  alive and well.  Her daughters are age 27, 66 and 3, and they are all alive  and well.   REVIEW OF SYSTEMS:  She does complain of occasional sinus congestion.  After  her gastric bypass, she has lost 70 pounds in the last year.  She does have  partial plate dentures on her upper jaw line and she does wear glasses.  She  has a  living will and her power of attorney is Benita Stabile and Phillips Hay.  All other systems are negative and noncontributory.   PHYSICAL EXAM:  GENERAL:  Well-developed, well-nourished, mildly overweight  white female in no acute distress.  Talks easily with the examiner.  Mood  and affect are appropriate.  Walks without a limp.  Height 5 feet 5 inches,  weight 185 pounds, BMI is 30.  VITAL SIGNS:  Temperature 97.4 degrees Fahrenheit, pulse 64, respirations  14; BP initially was 170/84 in the right arm and then taken at the end of  the exam, was 142/80.  HEENT:  Normocephalic, atraumatic, without frontal or maxillary sinus  tenderness to palpation.  Conjunctivae are pink.  Sclerae are anicteric.  PERRLA.  EOMs intact.  No visible external ear deformities.  Hearing grossly  intact.  Tympanic membranes are pearly gray bilaterally with good light  reflex.  Nose and nasal septum midline.  Nasal mucosa pink and moist without  exudates or polyps noted.  Buccal mucosa pink and moist.  Good dentition.  Pharynx without erythema or exudates.  Tongue and uvula midline.  Tongue  without fasciculations and uvula rises equally with phonation.  NECK:  No visible masses or lesions noted.  Trachea midline.  No palpable  lymphadenopathy nor thyromegaly.  Carotids +2 bilaterally without bruits.  Full range of motion and nontender to palpation along the cervical spine.  CARDIOVASCULAR:  Heart rate and rhythm regular.  S1 and S2 present without  rubs, clicks or murmurs noted.  RESPIRATORY:  Respirations even and unlabored.  Clear to auscultation  bilaterally without rales or wheezes noted.  ABDOMEN:  Rounded abdominal contour with well-healed laparoscopy portal  sites.  Bowel sounds present x4 quadrants.  Soft and nontender to palpation  without hepatosplenomegaly nor CVA tenderness.  Femoral pulses +2  bilaterally. BACK:  Nontender to palpation along the vertebral column.  BREASTS/GU/RECTAL/PELVIC:   These exams deferred at this time.  MUSCULOSKELETAL:  She does have a well-healed left axilla incision line.  No  erythema or ecchymosis.  She has red psoriatic plaque noted on the dorsum of  her  left hand that goes a little bit to the base of the thumb and then  across the MCP joints of the index, long and ring finger.  No other  deformities noted over upper extremity with full range of motion of these  extremities without pain.  Radial pulses +2 bilaterally.  She has full range  of motion of both hips, ankles and toes bilaterally.  DP and PT pulses are  +2.  She does have mild +2 pitting edema of both lower extremities.  No calf  pain with palpitation and negative Homan's sign bilaterally.  Right knee:  Skin is intact without erythema or ecchymosis.  She has full extension and  flexion to 120 degrees with minimal crepitus.  No pain with palpitation  along the joint line.  Stable to varus and valgus stress, and negative  anterior drawer.  Left knee:  Skin is intact without erythema or ecchymosis.  She has full extension, but flexion only to 75 degrees with a moderate  amount of crepitus.  She is acutely tender to palpation along the lateral  joint line, none medially.  Stable and varus and valgus stress, negative  anterior drawer.  NEUROLOGIC:  Alert and oriented x3.  Cranial nerves II-XII are grossly  intact.  Strength is 5/5, bilateral left upper extremities.  Rapid  alternating movements intact.  Deep tendon reflexes are 2+ in bilateral  upper and lower extremities.  Sensation intact to light touch.   RADIOLOGIC FINDINGS:  X-rays taken of her left knee in August of 2005 show  bone-on-bone contact in the medial compartment of the left knee.  There is  some spurring laterally.   IMPRESSION:  1.  End-stage osteoarthritis, medial compartment of left knee.  2.  History of breast cancer, left breast, with lymph node dissection.  3.  Psoriasis.   PLAN:  Ms. Sarchet will be admitted to  [facility] on August 27, 2004, where she will undergo a left total knee arthroplasty by Dr. Claude Manges.  Whitfield.  She is not to have any blood pressures or sticks on her left  arm.  She will undergo all the routine preoperative laboratory tests and  studies prior to this procedure.  If she has any medical issues while she is  hospitalized, we will contact one of the hospitalists.      Sherrilyn Rist   KED/MEDQ  D:  08/19/2004  T:  08/19/2004  Job:  [id number]

## 2011-01-29 NOTE — Cardiovascular Report (Signed)
[name], [name]                          ACCOUNT NO.:  [id number]   MEDICAL RECORD NO.:  [id number]                   PATIENT TYPE:  OIB   LOCATION:  2899                                 FACILITY:  [facility]   PHYSICIAN:  Balinda Quails, M.D.                 DATE OF BIRTH:  1950/12/26   DATE OF PROCEDURE:  02/04/2003  DATE OF DISCHARGE:                              CARDIAC CATHETERIZATION   PHYSICIAN:  Balinda Quails, M.D.   DIAGNOSES:  1. Morbid obesity.  2. History of deep venous thrombosis.   PROCEDURE:  1. Inferior vena cavagram.  2. Deployment of Optease retrievable inferior vena cava filter.   ACCESS:  Right common femoral vein 6-French sheath.   CONTRAST:  40 ml of Visipaque.   COMPLICATIONS:  None apparent.   CLINICAL NOTE:  The patient is a 60 year old female with a history of morbid  obesity scheduled to undergo bariatric surgery.  She has a history of deep  venous thrombosis.  She was referred for preoperative evaluation and  placement of a retrievable inferior vena cava filter.   The patient was seen in the office in consultation and it was recommended  she undergo temporary placement of an inferior vena cava filter for  prophylaxis against pulmonary embolus in the perioperative period.   The patient consented and the procedure is scheduled for today.   PROCEDURE NOTE:  The patient brought to the catheterization lab in stable  condition.  Placed in the supine position.   Right groin prepped and draped in the sterile fashion.  Administered 4 mg of  Nubain, 2 mg Versed intravenously.  Skin and subcutaneous tissue of the  right groin instilled with 1% Xylocaine.  A needle was easily induced into  the right common femoral vein.  A 0.035 J-wire passed through the needle  into the inferior vena cava under fluoroscopy.  A long 6-French delivery  sheath was then advanced over the guidewire.  Power injector attached to the  dilator and an inferior vena cavagram  carried out.  This clearly identified  the origin of the left renal vein.   The dilator was then removed and an Optease retrievable inferior vena cava  filter was advanced through the sheath and positioned in the immediate  infrarenal position in the inferior vena cava.  This was deployed  appropriately  Sheath then removed.  There were no apparent complications.  The patient transferred to holding area in stable condition.    FINAL IMPRESSION:  1. Successful deployment of Optease retrievable inferior vena cava filter.  2. The patient scheduled for retrieval in three weeks.                                               Balinda Quails, M.D.  PGH/MEDQ  D:  02/04/2003  T:  02/04/2003  Job:  [id number]   cc:   Vikki Ports, M.D.  [address], [address]  [place]  [place] [address]  Fax: [phone]   [facility]

## 2011-01-29 NOTE — Op Note (Signed)
[name], [name]                          ACCOUNT NO.:  [id number]   MEDICAL RECORD NO.:  [id number]                   PATIENT TYPE:  INP   LOCATION:  0481                                 FACILITY:  [facility]   PHYSICIAN:  Vikki Ports, M.D.         DATE OF BIRTH:  [birth year]   DATE OF PROCEDURE:  02/05/2003  DATE OF DISCHARGE:                                 OPERATIVE REPORT   PREOPERATIVE DIAGNOSES:  1. Morbid obesity.  2. End-stage osteoarthritis of bilateral knees.   POSTOPERATIVE DIAGNOSES:  1. Morbid obesity.  2. End-stage osteoarthritis of bilateral knees.   PROCEDURE:  Laparoscopic Roux-en-Y gastric bypass, antecolic, antegastric.   SURGEON:  Vikki Ports, M.D.   ASSISTANT:  Sharlet Salina T. Hoxworth, M.D.   ANESTHESIA:  General.   DESCRIPTION OF PROCEDURE:  The patient was taken to the operating room and  placed in supine position.  After adequate anesthesia was induced using  endotracheal tube, the abdomen was prepped and draped in normal sterile  fashion.  Using a 12 mm left upper quadrant subcostal incision, I dissected  down to the anterior fascia.  This was grasped with a tracheal hook, and the  Veress needle was introduced into the abdomen.  Pneumoperitoneum was  obtained.  Using the 0 degree scope through a 12 mm Optiview port, access  was obtained through the same incision.  Under direct visualization two 12  mm ports were placed in the right upper quadrant.  An additional 5 mm port  was placed in the left lateral position, and an additional port just left of  the midline supraumbilical.  This gave a total of four 12 mm ports and a 5  mm port.  There were a number of adhesions to the anterior abdominal wall  with the omentum.  These were taken down with the Harmonic scalpel.  There  were also dense adhesions involving the inferior omentum to the small bowel.  This was also taken down using blunt and sharp dissection using the Harmonic  scalpel.  After this had been completely mobilized, the transverse colon was  retracted superiorly and the ligament of Treitz was identified.  Counting 40  cm distal to the ligament of Treitz, the small bowel was divided using a 60  mm Autosuture linear cutter device.  The mesentery was divided approximately  an additional 2 cm using the Harmonic scalpel.  This gave [place] good  mobilization of the Roux limb.  Counting an additional 100 cm distal to this  same section, a jejunojejunostomy was performed using a 60 mm Autosuture GIA  stapling device.  The defect was closed with running 2-0 Vicryl, taking  great care not to narrow the distal lumen.  Tisseel was placed over the  anastomosis.  The Roux limb was then easily movable up to the proximal  stomach.  A Nathanson retractor was placed through the subxiphoid region and  the liver was retracted anteriorly.  The angle of His was dissected using  the Harmonic scalpel until the lesser sac could be visualized.  The lesser  omentum was then opened between the first and second vessels on the lesser  curve, and again the lesser sac was opened.  Using a transverse 60 mm linear  cutter, the stomach was divided.  Again all tubes were cleared of the  stomach and two additional firings of the 60 mm Autosuture linear cutter  were performed.  The third and final transection was accomplished with an  Ewald tube in place.  With the pouch completely divided, the Roux limb was  mobilized and a posterior running Vicryl in a seromuscular fashion  approximated the stomach pouch to the Roux limb.  The proximal gastric  staple line was covered with Tisseel.  Enterotomy was made in the stomach  pouch as well as the Roux limb.  The mesentery was identified and laid  nicely.  The mesenteric defect inferiorly was closed with a running 2-0 silk  suture.  The gastrojejunostomy was accomplished again with a 60 mm linear  cutter to about 30 mm.  The defect was closed again  with running 2-0 Vicryl  sutures and tying in the middle.  An anterior seromuscular layer was then  accomplished over the Ewald tube at the anastomosis.  The upper abdomen was  then irrigated and the Roux limb was clamped.  Upper endoscopy was then  performed by Dr. Glenna Fellows, showing good filling of the gastric  pouch and free flow into the Roux limb.  There was no identification of any  leak.  The Nathanson retractor was then removed.  The anastomosis was sealed  with Tisseel, the pneumoperitoneum was released, all trocars were removed,  skin incisions were closed with staples.  The patient tolerated the  procedure well.  Also of note, a Blake drain was placed near the  gastrojejunostomy and brought out through the left 5 mm incision.  Sterile  dressings were applied.  The patient tolerated the procedure well and went  to PACU in good condition.                                               Vikki Ports, M.D.    KRH/MEDQ  D:  02/05/2003  T:  02/05/2003  Job:  [id number]   cc:   Claude Manges. Cleophas Dunker, M.D.  [address] [place]  [place] [address]  Fax: [phone]   Almedia Balls. Fore, M.D.  [phone] [address] [place]  [place] [address]  Fax: [phone]

## 2011-01-29 NOTE — Op Note (Signed)
[name], [name] NO.:  [id number]   MEDICAL RECORD NO.:  [id number]          PATIENT TYPE:  INP   LOCATION:  2899                         FACILITY:  [facility]   PHYSICIAN:  Claude Manges. Whitfield, M.D.DATE OF BIRTH:  07/22/1951   DATE OF PROCEDURE:  08/27/2004  DATE OF DISCHARGE:                                 OPERATIVE REPORT   PREOPERATIVE DIAGNOSIS:  End stage osteoarthritis of the left knee.   POSTOPERATIVE DIAGNOSIS:  End stage osteoarthritis of the left knee.   OPERATION PERFORMED:  Left total knee replacement.   SURGEON:  Claude Manges. Cleophas Dunker, M.D.   ASSISTANT:  Richardean Canal, P.A.   ANESTHESIA:  General orotracheal anesthesia with supplemental femoral nerve  block.   COMPLICATIONS:  None.   COMPONENTS:  Depuy LCS complete standard femoral component, a #3 keeled  tibial tray with a 10 mm bridging bearing and a metal backed rotating  tripegged patella.  All were secured with polymethyl methacrylate.   DESCRIPTION OF PROCEDURE:  With the patient comfortable on the operating  table and under general orotracheal anesthesia, tourniquet was applied to  the left lower extremity.  The nursing staff then inserted a Foley catheter.  The left lower extremity was then prepped with Betadine scrub and then  DuraPrep from the tourniquet to the midfoot.  Sterile draping was performed.  With the extremity still elevated, it was Esmarch exsanguinated with a  proximal tourniquet at 350 mmHg.  Midline longitudinal incision was made  centered about the patella and via sharp dissection carried down to  subcutaneous tissue, first layer of capsule was incised in the midline.  A  medial parapatellar incision was made in the capsule with the Bovie.  There  was a clear yellow joint effusion approximately 15 mL.   The patella was then everted 180 degrees laterally.  The knee was flexed to  90 degrees.  There was mild to moderate synovitis.  There were large  osteophytes on the  medial more so than the lateral condyle.  The complete  absence of articular cartilage on the medial femoral condyle and tibial  plateau.  There was a fixed varus position, so I performed a medial release.  There were large osteophytes about the patella which were also released.  Preoperatively, we had measured a standard femoral component which we  confirmed intraoperatively.  We also confirmed a #3 tibial tray.  Initial  tibial cut was made transversely with the external jig.  Subsequent femoral  cuts were then made with the femoral jigs.  A 10 mm flexion extension gap  were perfectly symmetrical.  MCL and LCL remained intact throughout the  procedure.  The ACL and PCL and both menisci were sacrificed.  The patient  had had a subtotal medial meniscectomy preoperatively.  A 4 degree distal  femoral valgus cut was utilized and a 7 degree angle of declination in the  tibia.  The final tibial cuts were made to taper the femur.  The lamina  spreaders were inserted to remove remnants of medial and lateral menisci and  any osteophytes along the posterior femoral condyle  medially and laterally.  Osteophytes were also removed from the medial tibial plateau.   The tibial trial tray was then impacted with a keel, and a 10 mm bridging  bearing was applied followed by the trial femur.  Construct had full  extension and excellent flexion with no malrotation of the tibial  components.  There was no opening with either varus or valgus stress.  Patella was then prepared by removing 10 mm of bone leaving a 14 mm  thickness, tripegged jig was then applied and the trial patella was applied.  Through a full range of motion it was perfectly stable.   The trial components were then removed.  The joint was then copiously  irrigated with jet saline.  The final components were then secured with  polymethyl methacrylate.  Initially, the tibia was applied.  Extraneous  methacrylate was removed.  10 mm bridging  bearing was inserted followed by  the cemented femur.  Again, extraneous methacrylate was removed.  Patella  was applied with methacrylate and a patellar clamp.  After complete  maturation, the joint was then inspected and any hardened extraneous  _________ removed with an osteotome.  Joint was again copiously irrigated  with jet saline.   Tourniquet was then deflated.  Gross bleeders were Bovie coagulated.  We had  nice dry field without need for a Hemovac.  Again, components appeared to be  in excellent position and it was without instability.  The deep capsule was  closed with interrupted #1 Ethibond, superficial capsule closed with a  running 0 Vicryl, subcu with a 2-0 Vicryl, skin closed with skin clips.  Sterile bulky dressing was applied followed by Ace bandage.  A femoral nerve  block was to be performed by Dr. Katrinka Blazing for postoperative pain control.  The  patient tolerated the procedure without complications.      Cindee Lame   PWW/MEDQ  D:  08/27/2004  T:  08/27/2004  Job:  [id number]

## 2011-01-29 NOTE — Op Note (Signed)
   NAME:  DAINE, CROKER NO.:  [id number]   MEDICAL RECORD NO.:  [id number]                   PATIENT TYPE:  INP   LOCATION:  0005                                 FACILITY:  [facility]   PHYSICIAN:  Sharlet Salina T. Hoxworth, M.D.          DATE OF BIRTH:  September 05, 1951   DATE OF PROCEDURE:  02/05/2003  DATE OF DISCHARGE:                                 OPERATIVE REPORT   PREOPERATIVE DIAGNOSIS:  Status post laparoscopy Roux-en-Y gastric bypass.   POSTOPERATIVE DIAGNOSIS:  Status post laparoscopy Roux-en-Y gastric bypass.   PROCEDURE:  Upper endoscopy.   BRIEF HISTORY:  Upper endoscopy is performed at the end of Roux-en-Y gastric  bypass by Vikki Ports, M.D., to check for patency and leaks and  bleeding.   DESCRIPTION OF PROCEDURE:  Endoscope was passed into the esophagus and  esophagus was normal to the EG junction at 40 cm.  The gastric pouch was  entered and was distended with air to check for leaks.  The pouch measured  approximately 5 cm.  There was no bleeding from the staple lines.  The  anastomosis was visualized and appeared patent, and the scope was able to be  passed through the anastomosis into the jejunal limb.  Following this the  air was suctioned and the scope withdrawn.                                               Lorne Skeens. Hoxworth, M.D.    Tory Emerald  D:  02/05/2003  T:  02/05/2003  Job:  [id number]

## 2011-01-29 NOTE — Discharge Summary (Signed)
[name], [name] NO.:  [id number]   MEDICAL RECORD NO.:  [id number]          PATIENT TYPE:  INP   LOCATION:  5025                         FACILITY:  [facility]   PHYSICIAN:  [name], M.D.DATE OF BIRTH:  August 08, 1951   DATE OF ADMISSION:  08/27/2004  DATE OF DISCHARGE:  08/31/2004                                 DISCHARGE SUMMARY   ADMISSION DIAGNOSES:  1.  End stage osteoarthritis left knee.  2.  History of breast cancer.   DISCHARGE DIAGNOSES:  1.  Left total knee arthroplasty.  2.  History of breast cancer.   HISTORY OF PRESENT ILLNESS:  The patient is a 60 year old white female who  presents for about a 20 year history of progressively worsening left knee  pain.  Is now a constant, achy sharp pain over the superior lateral aspect  of the joint with radiation up and down the entire leg.  It worsens with any  sitting, transitioning from the sitting to standing position, up and down  stairs.  She does have popping, grinding, locking and swelling.  She does  have night pain.   ALLERGIES:  No known drug allergies.   CURRENT MEDICATIONS:  1.  Multivitamins.  2.  Vitamin B12.  3.  Vitamin C.   SURGICAL PROCEDURE:  On August 27, 2004, the patient was taken to the OR  by [name], assisted by [name], P.A.-C.  Under general  anesthesia, the patient underwent a left total knee arthroplasty with the  following components:  A size 3 keyhole tray, a size standard left femoral  component, standard patella, a size 10 mm polyethylene bearing.  Components  were implanted with polymethyl methacrylate.  The patient tolerated the  procedure well.  There were no complications.  The patient was transferred  to the recovery room and then to the orthopedic floor for routine total knee  protocol.   CONSULTATIONS:  Following are routine consults:  Physical therapy,  occupational therapy, case management.   Powers COURSE:  On August 27, 2004, the patient was admitted to [facility] under the care of [name].  The patient was taken  to the OR where a left total knee arthroplasty was performed.  The patient  tolerated the procedure well, and there were no complications.  The patient  was transferred to the recovery room and then to the orthopedic floor for  routine total knee protocol with her knee in a CPM.  Routine Coumadin and  Heparin DVT prophylaxis and a PCA.   On postop day #1, the patient had significant amount of calf pain.  Doppler  was performed, and this was negative for any signs of DVT or superficial  thrombus.  The patient's vital signs remained stable.  She remained  afebrile.  The patient had no other medical issues during her  hospitalization.  She was able to transition over from IV pain medications  to p.o. pain medications well.  Her wound remained benign for any signs of  infection.  Her leg remained neuromotor vascularly intact.  The patient  worked  very well with physical therapy.  Whereon postop day #4, the patient  was felt to be orthopedically and medically stable and ready for discharge  home.  So, she was discharged home for routine outpatient total knee  protocol with physical therapy and R. N. for Coumadin blood draws.   LABORATORY DATA:  Routine chemistries on December 17:  Sodium 137, potassium  3.4, drop in potassium and sodium were felt to be due to her hydration and  were allowed to normalize without any further treatment.  Glucose 105, BUN  7, creatinine 0.7.   Routine CBC on December 18:  WBC 8.1, hemoglobin 10.5, hematocrit 30.9,  platelets 149,000.  INR on December 18 was 1.5.  Chest x-ray on admission  was no active lung disease.   DISCHARGE MEDICATIONS:  1.  Colace 100 mg p.o. b.i.d.  2.  Trinsicon one tablet p.o. t.i.d.  3.  Laxative enema of choice p.r.n.  4.  Coumadin 5 mg p.o. daily.  5.  Tylenol 650 mg p.o. q.6h. p.r.n.  6.  Robaxin 500 mg p.o.  q.8h. p.r.n.  7.  Percocet 1-2 tablets q.4-6h. p.r.n.   DISCHARGE INSTRUCTIONS:  1.  Coumadin 5 mg takes one and one half tablet 7.5 mg daily until changed      by [name] pharmacy.  2.  Percocet 1-2 tablets q.4-6h. p.r.n. pain.  3.  Activity:  As tolerated with limited at 50% weightbearing on left leg      with the use of a walker.  4.  Diet:  No restrictions.  5.  Wound Care:  The patient is to keep wound clean and dry.  6.  Follow up:  The patient is to call [phone] for a follow up appointment      with [name] in the office in 10 days.   CONDITION ON DISCHARGE:  Improved and Tammy.      Robe   RWK/MEDQ  D:  08/31/2004  T:  09/01/2004  Job:  [id number]

## 2011-10-07 ENCOUNTER — Ambulatory Visit: Payer: PRIVATE HEALTH INSURANCE | Admitting: Emergency Medicine

## 2011-10-19 ENCOUNTER — Ambulatory Visit: Payer: PRIVATE HEALTH INSURANCE | Admitting: Family Medicine

## 2011-10-19 ENCOUNTER — Ambulatory Visit (INDEPENDENT_AMBULATORY_CARE_PROVIDER_SITE_OTHER): Payer: PRIVATE HEALTH INSURANCE | Admitting: Family Medicine

## 2011-10-19 ENCOUNTER — Encounter: Payer: Self-pay | Admitting: Family Medicine

## 2011-10-19 VITALS — BP 150/83 | HR 102 | Temp 98.7°F | Ht 65.0 in | Wt 287.9 lb

## 2011-10-19 DIAGNOSIS — R600 Localized edema: Secondary | ICD-10-CM

## 2011-10-19 DIAGNOSIS — I10 Essential (primary) hypertension: Secondary | ICD-10-CM

## 2011-10-19 DIAGNOSIS — R0600 Dyspnea, unspecified: Secondary | ICD-10-CM

## 2011-10-19 DIAGNOSIS — R0989 Other specified symptoms and signs involving the circulatory and respiratory systems: Secondary | ICD-10-CM

## 2011-10-19 DIAGNOSIS — F17200 Nicotine dependence, unspecified, uncomplicated: Secondary | ICD-10-CM

## 2011-10-19 DIAGNOSIS — R609 Edema, unspecified: Secondary | ICD-10-CM

## 2011-10-19 DIAGNOSIS — R0602 Shortness of breath: Secondary | ICD-10-CM

## 2011-10-19 DIAGNOSIS — R0609 Other forms of dyspnea: Secondary | ICD-10-CM

## 2011-10-19 LAB — BASIC METABOLIC PANEL
BUN: 17 mg/dL (ref 6–23)
CO2: 34 mEq/L — ABNORMAL HIGH (ref 19–32)
Calcium: 8.3 mg/dL — ABNORMAL LOW (ref 8.4–10.5)
Chloride: 104 mEq/L (ref 96–112)
Creat: 0.56 mg/dL (ref 0.50–1.10)
Glucose, Bld: 100 mg/dL — ABNORMAL HIGH (ref 70–99)
Potassium: 3.8 mEq/L (ref 3.5–5.3)
Sodium: 147 mEq/L — ABNORMAL HIGH (ref 135–145)

## 2011-10-19 LAB — CBC
HCT: 37.7 % (ref 36.0–46.0)
Hemoglobin: 9.6 g/dL — ABNORMAL LOW (ref 12.0–15.0)
MCH: 22.5 pg — ABNORMAL LOW (ref 26.0–34.0)
MCHC: 25.5 g/dL — ABNORMAL LOW (ref 30.0–36.0)
MCV: 88.3 fL (ref 78.0–100.0)
Platelets: 141 10*3/uL — ABNORMAL LOW (ref 150–400)
RBC: 4.27 MIL/uL (ref 3.87–5.11)
RDW: 19.9 % — ABNORMAL HIGH (ref 11.5–15.5)
WBC: 6.9 10*3/uL (ref 4.0–10.5)

## 2011-10-19 NOTE — Patient Instructions (Signed)
Return on

## 2011-10-20 ENCOUNTER — Telehealth: Payer: Self-pay | Admitting: Family Medicine

## 2011-10-20 DIAGNOSIS — R0989 Other specified symptoms and signs involving the circulatory and respiratory systems: Secondary | ICD-10-CM

## 2011-10-20 DIAGNOSIS — R600 Localized edema: Secondary | ICD-10-CM | POA: Insufficient documentation

## 2011-10-20 LAB — BRAIN NATRIURETIC PEPTIDE: Brain Natriuretic Peptide: 361 pg/mL — ABNORMAL HIGH (ref 0.0–100.0)

## 2011-10-20 MED ORDER — FUROSEMIDE 40 MG PO TABS: ORAL_TABLET | ORAL | Status: DC

## 2011-10-20 NOTE — Assessment & Plan Note (Signed)
Encouraged smoking cessation. Pt not ready to quit 

## 2011-10-20 NOTE — Telephone Encounter (Signed)
Paged Dr. Edmonia James and she advises to send in Lasix 40 mg with directions to take two daily until next appointment. Patient notified. She has appointment next week. Information given about PFT, but advised do not know cost. Gave her # to call.

## 2011-10-20 NOTE — Assessment & Plan Note (Signed)
Similar to episode that occurred in 01/2011.  Pt did not return for full work up.  Will treat with lasix 80mg  daily.  Pt to check daily weights.  Pt to return for recheck in 2-3 days with myself or Dr. Mauricio Po.  Pt to also schedule PFT to further evaluate SOB in the setting of chronic tobacco abuse.  Echo normal in 01/2101 except for mild LVH- normal ef.  Will obtain bnp and if elevated will consider recheck of echo.  Also will obtain bmet since we will be doing aggressive diuresis.

## 2011-10-20 NOTE — Telephone Encounter (Signed)
Pt states that Dr Edmonia James was supposed to call in her Lasix and pharmacy has not rec'd it yet.  Also wants to know what a PFT is and what do they do.  Wants to know the cost is.

## 2011-10-20 NOTE — Progress Notes (Signed)
  Subjective:    Patient ID: [name], female    DOB: 12-26-1950, 61 y.o.   MRN: [id number]  HPI SOB: Pt reports increased sob x 1 month.  States that sob is worse with activity.  Improves with rest.  Is limiting the activities that she can do comfortably.  + overall fatigue.  No chest pain.  + chronic cough.  Had a cold for a few days approx 1 month ago- had increased cough and increased sputum at that time.  The cough and mucous production is now back to her baseline.  Had similar episode that improved with lasix in 01/2011.  Improved with lasix.  Did not return for follow up or further workup after resolution of symptoms.  Pt smokes 1/2 ppd x 40 years.   Lower ext edema: Pt states that she has gained a lot of weight over the past month.  Lower ext edema began about the time that the sob did.  Also has swelling/edema in pannus, and upper legs at times.  Taking lasix 40mg  daily and has some urine output.  But continues to gain weight.   Does not do daily weights but does weigh time to time.  States that she has gained about 50lbs in the past month.    Hypertension: States she doesn't have a history of hypertension.  BP  150/83 today.  In chart has record of bp elevations chronically.  Not on any bp medication. No dizziness. No syncope.  No headache.   Review of Systems As per abov.e No CP.  No n/v/d.  No diaphoresis.  No  Increase cough.  No increase mucous production.  No fever.      Objective:   Physical Exam  Constitutional: She appears well-developed and well-nourished.       obese  Neck: Normal range of motion. No thyromegaly present.  Cardiovascular: Normal rate, regular rhythm and normal heart sounds.   No murmur heard. Pulmonary/Chest: She has wheezes (scattered wheezing bilateral, good air movement to bases. ). She exhibits no tenderness.       Increased work of breathing.   Abdominal: Soft. She exhibits no distension. There is no tenderness. There is no rebound.       + edema  in dependent areas of pannus.   Musculoskeletal: She exhibits edema (1-2 + lower ext edema).  Skin: No rash noted.  Psychiatric: She has a normal mood and affect. Her behavior is normal.         Assessment & Plan:

## 2011-10-20 NOTE — Assessment & Plan Note (Signed)
This appears to be a chronic problem- pt almost out of lasix so I believe that this most likely prompted pt to make today's appt.  Gave refill of lasix.  There is concern for heart failure- see above problem listed dyspnea for detailed plan.

## 2011-10-20 NOTE — Assessment & Plan Note (Signed)
Need to follow closely.  Encouraged pt to monitor at home.  Will most likely need a blood pressure medication.  Pt not ready to start bp medication today- states that this is a new problem for her.  Feels that the bp elevation is due to her worsening sob.

## 2011-10-20 NOTE — Telephone Encounter (Signed)
Addended by: Orvil Feil L on: 10/20/2011 04:14 PM   Modules accepted: Orders

## 2011-10-20 NOTE — Telephone Encounter (Signed)
Closed encounter accidently

## 2011-10-21 ENCOUNTER — Encounter: Payer: Self-pay | Admitting: Family Medicine

## 2011-10-25 ENCOUNTER — Ambulatory Visit: Payer: PRIVATE HEALTH INSURANCE | Admitting: Pharmacist

## 2011-10-26 ENCOUNTER — Ambulatory Visit (INDEPENDENT_AMBULATORY_CARE_PROVIDER_SITE_OTHER): Payer: PRIVATE HEALTH INSURANCE | Admitting: Family Medicine

## 2011-10-26 ENCOUNTER — Encounter: Payer: Self-pay | Admitting: Family Medicine

## 2011-10-26 DIAGNOSIS — Z853 Personal history of malignant neoplasm of breast: Secondary | ICD-10-CM

## 2011-10-26 DIAGNOSIS — R7301 Impaired fasting glucose: Secondary | ICD-10-CM | POA: Insufficient documentation

## 2011-10-26 DIAGNOSIS — D649 Anemia, unspecified: Secondary | ICD-10-CM

## 2011-10-26 DIAGNOSIS — R5383 Other fatigue: Secondary | ICD-10-CM | POA: Insufficient documentation

## 2011-10-26 DIAGNOSIS — I10 Essential (primary) hypertension: Secondary | ICD-10-CM

## 2011-10-26 DIAGNOSIS — R5381 Other malaise: Secondary | ICD-10-CM

## 2011-10-26 DIAGNOSIS — F17200 Nicotine dependence, unspecified, uncomplicated: Secondary | ICD-10-CM

## 2011-10-26 DIAGNOSIS — D509 Iron deficiency anemia, unspecified: Secondary | ICD-10-CM | POA: Insufficient documentation

## 2011-10-26 LAB — POCT GLYCOSYLATED HEMOGLOBIN (HGB A1C): Hemoglobin A1C: 5.5

## 2011-10-26 MED ORDER — HYDROCHLOROTHIAZIDE 12.5 MG PO CAPS: 12.5000 mg | ORAL_CAPSULE | Freq: Every day | ORAL | Status: DC

## 2011-10-26 NOTE — Progress Notes (Signed)
  Subjective:    Patient ID: [name], female    DOB: [birth year], 61 y.o.   MRN: [id number]  HPI Patient here for follow up of several issues:   1. Seen here last week for cough and shortness of breath, edema of hands and legs.  Was given Lasix 80mg  daily and noticed vast improvement in swelling and breathing.  She has lost 8lbs since by our measurement.  Denies chest pressure or discomfort.  Continues with some leg swelling albeit better than before.  Of note, ECHO May 2012 with some LVH but no impairment of LVEF; no pulm hypertension findings on echo.  2. Found to have anemia normocytic (Hgb 9's) at last CBC; previous Hgb 2 yrs ago was 12.  She denies vaginal bleeding (menopause 1999), no melena or gross hematochezia, no GERD symptoms or hematemesis, no hemoptysis.  No hematuria that is visible to her.  Has not had colonoscopy for colon cancer screening, this is recommended to her today as part of her workup.  3. Feels tired all the time; nods off to sleep easily.  Says she sleeps well at home, no snoring. Not fitful sleep.   4. Elevated BP on multiple readings.  She attributes to pain.  Is quite resistant to the idea that she might have HTN as a diagnosis.  Recheck today with manual cuff is 124/60 at rest.   5.  History breast cancer L breast on lumpectomy, with axillary dissection which were all negative (1997) had XRT but no chemotx.  Does not follow with oncology for this.  Reports last mammogram around 2 yrs ago.   6. Continues to smoke, around 1 pack every 3 days.  Is ambivalent about quitting. Is resistant to the idea of office spirometry; says she had full lung function testing several years ago and was 'normal'.    7. Psoriasis: stable per patient.  Does not see Rheum for this. No treatment at the present time.   Review of Systems  See HPI     Objective:   Physical Exam  Well appearing, breathing comfortably, no apparent distress HEENT Neck supple, moist mucus membranes. No  cervical adenopathy.  COR regular S1S2, no extra sounds PULM Clear bilaterally, no rales or wheezes LEs; 1+ bilateral pitting edema to midway up ankles.  SKIN Psoriatic plaques across hands; abdomen, legs.       Assessment & Plan:

## 2011-10-26 NOTE — Assessment & Plan Note (Signed)
IFG on last two lab checks.  To check A1C today. Follow up in 4 weeks on this.

## 2011-10-26 NOTE — Patient Instructions (Signed)
It was a pleasure to see you today.  We discussed several issues which I would like for you to consider:  1. Anemia, new on last blood draw.  I am checking labs including iron studies for this.  I  STRONGLY RECOMMEND SEEING A GASTROENTEROLOGIST FOR ENDOSCOPIC EVALUATION RELATED TO THIS PROBLEM.  2. Fluid retention.  I am glad you have had improvement on the Lasix.  I am changing you to a different water pill, HCTZ 12.5mg  daily.  Stop the Lasix for now.   3. I am checking your calcium level again, as well as other tests associated with this.  4. I recommend breathing testing (Spirometry) available in this office, by the Pharmacy [facility].   5. I strongly recommend smoking cessation.   6. I would like you to come back in 4 to 6 weeks to see me.

## 2011-10-26 NOTE — Assessment & Plan Note (Signed)
Serial BP that are elevated. Manual cuff today is not.  May start with mild HCTZ to see if helps with fluid retention as well as blood pressure.

## 2011-10-26 NOTE — Assessment & Plan Note (Signed)
To repeat calcium, albumin, PTH and TSH today.

## 2011-10-26 NOTE — Assessment & Plan Note (Signed)
Patient with normocytic anemia newly identified on the most recent CBC.  Certainly could be associated with inflammatory process (psoriasis), but not present on prior CBC.  In setting of longtime cigarette use and dyspnea, one might expect a polycythemia rather than an anemia.  Will check iron studies and reticulocyte count, ferritin. I have counseled and strongly recommended endoscopic evaluation as part of this workup, discussed the fact taht GI sources of bleeding do not always manifest as frank hematochezia or hematemesis.  Patient is very resistant to this idea, is not inclined to accept this recommendation. Does acquiesce to starting with lab workup.

## 2011-10-26 NOTE — Assessment & Plan Note (Signed)
Counseled to quit smoking.  Discussed possibility that lung disease could be contributing to shortness of breath, recommended office spirometry in [facility] for diagnosis.  She is resistant to this recommendation.

## 2011-10-27 LAB — CBC WITH DIFFERENTIAL/PLATELET
Basophils Absolute: 0.1 10*3/uL (ref 0.0–0.1)
Basophils Relative: 1 % (ref 0–1)
Eosinophils Absolute: 0.1 10*3/uL (ref 0.0–0.7)
Eosinophils Relative: 1 % (ref 0–5)
HCT: 43.7 % (ref 36.0–46.0)
Hemoglobin: 10.7 g/dL — ABNORMAL LOW (ref 12.0–15.0)
Lymphocytes Relative: 17 % (ref 12–46)
Lymphs Abs: 1.3 10*3/uL (ref 0.7–4.0)
MCH: 22.2 pg — ABNORMAL LOW (ref 26.0–34.0)
MCHC: 24.5 g/dL — ABNORMAL LOW (ref 30.0–36.0)
MCV: 90.9 fL (ref 78.0–100.0)
Monocytes Absolute: 0.6 10*3/uL (ref 0.1–1.0)
Monocytes Relative: 8 % (ref 3–12)
Neutro Abs: 5.4 10*3/uL (ref 1.7–7.7)
Neutrophils Relative %: 72 % (ref 43–77)
Platelets: 180 10*3/uL (ref 150–400)
RBC: 4.81 MIL/uL (ref 3.87–5.11)
RDW: 20.8 % — ABNORMAL HIGH (ref 11.5–15.5)
WBC: 7.4 10*3/uL (ref 4.0–10.5)

## 2011-10-27 LAB — COMPREHENSIVE METABOLIC PANEL
ALT: <8 U/L (ref 0–35)
AST: 14 U/L (ref 0–37)
Albumin: 3.2 g/dL — ABNORMAL LOW (ref 3.5–5.2)
Alkaline Phosphatase: 74 U/L (ref 39–117)
BUN: 12 mg/dL (ref 6–23)
CO2: 38 mEq/L — ABNORMAL HIGH (ref 19–32)
Calcium: 8.2 mg/dL — ABNORMAL LOW (ref 8.4–10.5)
Chloride: 99 mEq/L (ref 96–112)
Creat: 0.58 mg/dL (ref 0.50–1.10)
Glucose, Bld: 95 mg/dL (ref 70–99)
Potassium: 3.3 mEq/L — ABNORMAL LOW (ref 3.5–5.3)
Sodium: 144 mEq/L (ref 135–145)
Total Bilirubin: 0.6 mg/dL (ref 0.3–1.2)
Total Protein: 6.2 g/dL (ref 6.0–8.3)

## 2011-10-27 LAB — FERRITIN: Ferritin: 8 ng/mL — ABNORMAL LOW (ref 10–291)

## 2011-10-27 LAB — PTH, INTACT AND CALCIUM
Calcium, Total (PTH): 8.2 mg/dL — ABNORMAL LOW (ref 8.4–10.5)
PTH: 217.2 pg/mL — ABNORMAL HIGH (ref 14.0–72.0)

## 2011-10-27 LAB — IRON AND TIBC
%SAT: 3 % — ABNORMAL LOW (ref 20–55)
Iron: 16 ug/dL — ABNORMAL LOW (ref 42–145)
TIBC: 521 ug/dL — ABNORMAL HIGH (ref 250–470)
UIBC: 505 ug/dL — ABNORMAL HIGH (ref 125–400)

## 2011-10-27 LAB — RETICULOCYTES
ABS Retic: 158.7 10*3/uL (ref 19.0–186.0)
RBC.: 4.81 MIL/uL (ref 3.87–5.11)
Retic Ct Pct: 3.3 % — ABNORMAL HIGH (ref 0.4–2.3)

## 2011-10-27 LAB — TSH: TSH: 3.469 u[IU]/mL (ref 0.350–4.500)

## 2011-10-28 ENCOUNTER — Telehealth: Payer: Self-pay | Admitting: Family Medicine

## 2011-10-28 ENCOUNTER — Encounter: Payer: Self-pay | Admitting: Family Medicine

## 2011-10-28 DIAGNOSIS — D509 Iron deficiency anemia, unspecified: Secondary | ICD-10-CM

## 2011-10-28 MED ORDER — FERROUS SULFATE 325 (65 FE) MG PO TABS: 325.0000 mg | ORAL_TABLET | Freq: Every day | ORAL | Status: DC

## 2011-10-28 NOTE — Telephone Encounter (Signed)
Attempted to call patient at home and mobile numbers, left 2 voice messages.  I wish to discuss lab results with patient.  Of note, she has findings of iron-deficiency anemia with wide RDW and normal MCV @90 .  Low albumin and her corrected calcium is 8.8 (normal range).  Concern for processes that might be associated with malnutrition and relative correcting for the usual finding of iron deficiency (usually microcytosis). As for the elevated BNP and clinical findings consistent with CHF in setting of normal EF on ECHO 01/2011, consider other causes of elevated BNP, such as pulmonary hypertension, ischemic cardiac disease. The patient had an ECG in May 2012 which did not show LBBB.  I believe she would be a good candidate for exercise treadmill test.  I will address this plan with her when I speak with her by phone.  Also, the need to address the iron deficiency anemia with endoscopic evaluation as well as iron supplementation.  To check B12 level at next visit as well.   Paula Compton, M.D.

## 2011-10-28 NOTE — Telephone Encounter (Signed)
Called patient back and discussed lab results, namely the iron-deficiency anemia and the low albumin/Ca++ (which corrects within normal range).  She reports poor diet since Dec when she had dental work done.  Reports very sporadic alcohol use ("once in a blue moon").  Also discussed the elevated PTH as likely secondary to the low Ca++.   Advised iron supplementation and GI evaluation, which she is open to.  She plans to call back to give the name of her mother's GI, where she would like to be referred.   Also I discussed my concern that she could have some ischemic heart disease that could be responsible for her intermittent clinical presentation of CHF with mildly elevated BNP. She reports having a nuclear stress test in 2003, which she says was normal (I cannot access the notes in Epic, but I see a visit with Dr. Peter [place]and in May 2003).  She states several times that "I know there's nothing wrong with my heart-- I know my body."  Is concerned about the cost of testing.  I have asked that she make a follow up appointment with me in the coming month, or after GI evaluation, to continue to evaluate.  May wish to check 25OHD in addition to repeatCMet/PTH at that time.  Paula Compton, MD

## 2011-11-01 ENCOUNTER — Telehealth: Payer: Self-pay | Admitting: *Deleted

## 2011-11-01 NOTE — Telephone Encounter (Signed)
Left message for patient to return call. Please ask her if she has ever seen a GI specialist before, if so who was it? I need this info for GI referral.Md Smola, Rodena Medin

## 2011-11-03 NOTE — Telephone Encounter (Signed)
Patient returned Robert's call

## 2011-11-03 NOTE — Telephone Encounter (Signed)
Called and informed patient of appointment with Dr Loreta Ave on 11/11/11 at 2:30.Nolon Yellin, Rodena Medin

## 2011-11-03 NOTE — Telephone Encounter (Signed)
Called patient back, she has not seen GI specialist before, will schedule with Dr Loreta Ave who is on call this month.Melik Blancett, Rodena Medin

## 2011-11-05 ENCOUNTER — Telehealth: Payer: Self-pay | Admitting: Family Medicine

## 2011-11-05 NOTE — Telephone Encounter (Signed)
Left message for patient to return call.[name]  

## 2011-11-05 NOTE — Telephone Encounter (Signed)
Pt is nauseated and pain in side - thinks it could be coming from gall bladder.  She wants to have an [place] of this and he states she has a colonoscopy next week, & could they do that at the same time.  But in talking to her about this, it seems that it's the preliminary appt to go over the procedure and then set the time up for it.  She was upset about this, stating that she can't keep taking time off of work.  Wants to talk to Dr Mauricio Po

## 2011-11-08 ENCOUNTER — Telehealth: Payer: Self-pay | Admitting: *Deleted

## 2011-11-08 NOTE — Telephone Encounter (Signed)
Pt called back. Re: request for [place]. Advised pt that in order to have [place], Dr.Breen needs to see her. Pt also has upcoming appt with Dr.Mann. Pt will wait and talk with Dr.Mann about the issue. Declined to be seen as a work-in or OV with Dr.Breen. Lorenda Hatchet, Renato Battles

## 2011-11-08 NOTE — Telephone Encounter (Signed)
Pt has appt with Dr.Mann on Thursday and never called back. Lorenda Hatchet, Renato Battles fyi

## 2011-11-11 ENCOUNTER — Other Ambulatory Visit: Payer: Self-pay | Admitting: Gastroenterology

## 2011-11-11 DIAGNOSIS — R109 Unspecified abdominal pain: Secondary | ICD-10-CM

## 2011-11-16 ENCOUNTER — Ambulatory Visit
Admission: RE | Admit: 2011-11-16 | Discharge: 2011-11-16 | Disposition: A | Discharge status: Home / Self Care | Payer: No Typology Code available for payment source | Source: Ambulatory Visit | Attending: Gastroenterology | Admitting: Gastroenterology

## 2011-11-16 ENCOUNTER — Other Ambulatory Visit: Payer: PRIVATE HEALTH INSURANCE

## 2011-11-16 DIAGNOSIS — R109 Unspecified abdominal pain: Secondary | ICD-10-CM

## 2011-11-16 MED ORDER — IOHEXOL 300 MG/ML  SOLN
125.0000 mL | Freq: Once | INTRAMUSCULAR | Status: AC | PRN
  Administered 2011-11-16: 125 mL via INTRAVENOUS

## 2011-11-16 MED ORDER — IOHEXOL 300 MG/ML  SOLN
30.0000 mL | Freq: Once | INTRAMUSCULAR | Status: AC | PRN
  Administered 2011-11-16: 30 mL via ORAL

## 2011-11-17 ENCOUNTER — Telehealth: Payer: Self-pay | Admitting: *Deleted

## 2011-11-17 NOTE — Telephone Encounter (Signed)
Message left on our office voice mail from [place].  Patient was referred to Dr. Mauricio Po for abdominal pain.  Had CT scan done and it showed kidney & lung abnormalities.  Wants to make sure Dr. Mauricio Po received report.  Called back and informed that office note was in Dr. Marinell Blight box and CT scan report is in Epic.  Will route note to Dr. Mauricio Po.  Gaylene Brooks, RN

## 2011-11-23 ENCOUNTER — Telehealth: Payer: Self-pay | Admitting: Family Medicine

## 2011-11-23 DIAGNOSIS — E8809 Other disorders of plasma-protein metabolism, not elsewhere classified: Secondary | ICD-10-CM

## 2011-11-23 NOTE — Telephone Encounter (Signed)
24 HR URINE PROTEIN IS $25

## 2011-11-23 NOTE — Telephone Encounter (Signed)
Called patient to inquire about her welfare. She continues with swelling.  CT abdomen with generalized body wall edema, also small fluid collections in pleural and pericardial spaces.  Concern for nephrotic syndrome given prior hypoalbuminemia (previously attributed to poor nutrition with recent dental work Dec 2012).  For 24hr urine protein.  Patient concerned about cost of study; I asked her to direct this question to our lab personnel who may be able to look up the cost.  She has many questions about causes/treatments of nephrotic syndrome, will address more explicitly if she exhibits proteinuria to the level of nephrotic syndrome.  Paula Compton, MD

## 2011-12-07 ENCOUNTER — Ambulatory Visit: Payer: PRIVATE HEALTH INSURANCE | Admitting: Family Medicine

## 2011-12-31 ENCOUNTER — Ambulatory Visit (INDEPENDENT_AMBULATORY_CARE_PROVIDER_SITE_OTHER): Payer: Self-pay | Admitting: Family Medicine

## 2011-12-31 VITALS — BP 133/84 | HR 91 | Ht 65.0 in | Wt 268.3 lb

## 2011-12-31 DIAGNOSIS — R609 Edema, unspecified: Secondary | ICD-10-CM

## 2011-12-31 DIAGNOSIS — E8809 Other disorders of plasma-protein metabolism, not elsewhere classified: Secondary | ICD-10-CM

## 2011-12-31 DIAGNOSIS — R600 Localized edema: Secondary | ICD-10-CM

## 2011-12-31 DIAGNOSIS — W108XXA Fall (on) (from) other stairs and steps, initial encounter: Secondary | ICD-10-CM | POA: Insufficient documentation

## 2011-12-31 DIAGNOSIS — F17200 Nicotine dependence, unspecified, uncomplicated: Secondary | ICD-10-CM

## 2011-12-31 DIAGNOSIS — D509 Iron deficiency anemia, unspecified: Secondary | ICD-10-CM

## 2011-12-31 DIAGNOSIS — IMO0002 Reserved for concepts with insufficient information to code with codable children: Secondary | ICD-10-CM

## 2011-12-31 MED ORDER — FUROSEMIDE 20 MG PO TABS: 20.0000 mg | ORAL_TABLET | Freq: Every day | ORAL | Status: DC

## 2011-12-31 MED ORDER — OXYCODONE-ACETAMINOPHEN 5-325 MG PO TABS: 1.0000 | ORAL_TABLET | Freq: Three times a day (TID) | ORAL | Status: AC | PRN

## 2011-12-31 NOTE — Assessment & Plan Note (Addendum)
Patient with iron deficiency anemia.  Was seen by Dr Loreta Ave (GI).  Records from that visit are not available to me at the time of this visit.  Patient reports that she was not recommended to have endoscopic evaluation.  Was sent for CT abdomen, which was reviewed by me in Epic.  I have concern for possible GI blood loss in this 62 yoF with iron deficiency. Will request notes from GI visit.  FeSO4 325mg  two tabs daily, patient prefers otc iron supplementation.

## 2011-12-31 NOTE — Assessment & Plan Note (Signed)
Patient with recent fall while climbing two steps into house.  States she simply didn't clear the step.  No LOC or presyncope, nothing to suggest cardiogenic cause.  States she still has some pain over right humerus, but not exquisitely tender to palpate.  Full active ROM as per my physical exam.   Given the concern for possible nephrotic syndrome (see "edema" discussion), I prefer she not take NSAIDs scheduled.  She asks for refill of Percocet, which she agrees is only to be used for this acute pain complaint and not as a chronic management strategy for her chronic pain.  She is to use 1/2 tablet at bedtime to help with pain that disrupts her sleep.

## 2011-12-31 NOTE — Assessment & Plan Note (Signed)
Continues with lower extremity edema, somewhat better (and 12 lbs lost) since Feb visit.  Of note, she had mildly low albumin on CMet in February, which raises concern for nephrotic syndrome as a cause for her edema.   I have explained this to her at length, including my reasoning for wanting to follow up on her chemistry panel (and K+, which was 3.0 at that time), in light of diuretic use.  She is very concerned about the cost of outpatient labs, stating that her insurance has stopped paying.  She states that she would be willing to consider the 24 hour urine that was ordered, but is not inclined to do the serum labs today.  Furthermore, she has concerns about the cost of the 24 hour urine protein collection and is being given the phone number for the lab to call and verify the cost of this study.  WIll continue on lasix 20mg  daily for now to help with swelling.

## 2011-12-31 NOTE — Assessment & Plan Note (Signed)
Patient counseled to quit smoking.  She is adamant about her lack of interest in quitting.

## 2011-12-31 NOTE — Patient Instructions (Signed)
I am glad to see you today.    For the pain associated with your recent fall, I am giving you a limited supply of Percocet.  This is not a regular medication for your chronic back pain.  For the swelling, I am ordering blood work today, as well as a 24 hour urine collection to measure protein in your urine.  This will be necessary to determine if your kidneys are the reason for the swelling.   For the anemia, I recommend you take Ferrous Sulfate (iron supplements) 325mg  tablets, one tablet twice daily.   I would like to get your signature on a release of records form from Dr. Loreta Ave, Gastroenterology.  I would like to see you back in 4-6 weeks.

## 2011-12-31 NOTE — Progress Notes (Signed)
  Subjective:    Patient ID: [name], female    DOB: 03-28-1951, 61 y.o.   MRN: [id number]  HPI Patient here for follow up.  She states she fell while going up 2 steps on Thursday, March 28th and fell onto her Right shoulder, side, and hip.  Was walking up front steps to someone's home, was using her cane as usual, and had trouble lifting her right leg up to clear the step.  Did not have syncope or presyncope, no palpitations or chest pain, no LOC or head trauma. Fell onto right humerus and side, rolled onto her right hip. Developed extensive bruising over the right arm afterward but has been able to use shoulder/arm/leg since.  Used tylenol, ice/heat, and occasional Aleve (2 to 3 tablets a week) and 1/2 tab Percocet 5/325 three times a week since.  She has with her today a bottle of Percocet #15, prescribed by  PA named Su Hoff, whom she says works with Dr Ethelene Hal, orthopedics.  She was seen there last August (date of Rx) for back pain, received injections and was mildly better for a short while.   Another issue we are following today has to do with lower extremity swelling.  She has been using Lasix 40mg  three times a week, as well as HCTZ 12.5 two to three times weekly.  She notes mild improvement in leg edema, and she is down 12 lbs from last visit 2 months ago. Denies recent shortness of breath, chest pain or pressure, dysuria.    Discussed her anemia, which appears to be iron-deficiency based on iron studies/ferritin.   Social Hx: She continues to smoke, states a pack lasts 2 days.  States she does not want to quit ("I don't know why everyone insists that I quit smoking.")  Review of SystemsSee above.  Denies history of SLE.     Objective:   Physical Exam Alert, mild pallor with pale conjunctivae.  Walks with cane.  Able to get up from chair with cane and without assistance.  No acute distress HEENT Neck supple. Moist mucus membranes.  No cervical adenopathy. No point tenderness  over neck, full active ROM neck in all planes.  COR S1S2, no extra sounds or murmurs.  PULM Clear bilaterally, no dullness noted. No rales or wheezes LEs: 2+ pitting edema bilaterally. Palpable dp pulses bilaterally.  MSK: ecchymosis right upper arm.  Full active ROM right shoulder; internal rotation without limitation, negative Hawkins/Neer tests.  Handgrip and UE flexion/extension full and symmetric bilaterally.  Hip flexion full and symmetric.        Assessment & Plan:

## 2012-05-03 DIAGNOSIS — IMO0002 Reserved for concepts with insufficient information to code with codable children: Secondary | ICD-10-CM | POA: Insufficient documentation

## 2013-03-14 ENCOUNTER — Other Ambulatory Visit (HOSPITAL_COMMUNITY): Payer: Self-pay | Admitting: Gynecology

## 2013-03-14 DIAGNOSIS — Z1231 Encounter for screening mammogram for malignant neoplasm of breast: Secondary | ICD-10-CM

## 2013-03-29 ENCOUNTER — Ambulatory Visit (HOSPITAL_COMMUNITY)
Admission: RE | Admit: 2013-03-29 | Discharge: 2013-03-29 | Disposition: A | Discharge status: Home / Self Care | Payer: BC Managed Care – PPO | Source: Ambulatory Visit | Attending: Gynecology | Admitting: Gynecology

## 2013-03-29 DIAGNOSIS — Z1231 Encounter for screening mammogram for malignant neoplasm of breast: Secondary | ICD-10-CM | POA: Insufficient documentation

## 2013-12-12 DIAGNOSIS — J189 Pneumonia, unspecified organism: Secondary | ICD-10-CM

## 2013-12-12 HISTORY — DX: Pneumonia, unspecified organism: J18.9

## 2013-12-23 ENCOUNTER — Inpatient Hospital Stay (HOSPITAL_BASED_OUTPATIENT_CLINIC_OR_DEPARTMENT_OTHER)
Admission: EM | Admit: 2013-12-23 | Discharge: 2013-12-31 | DRG: 208 | Disposition: A | Discharge status: Home / Self Care | Payer: Medicare Other | Attending: Internal Medicine | Admitting: Internal Medicine

## 2013-12-23 ENCOUNTER — Emergency Department (HOSPITAL_BASED_OUTPATIENT_CLINIC_OR_DEPARTMENT_OTHER): Payer: Medicare Other

## 2013-12-23 ENCOUNTER — Encounter (HOSPITAL_BASED_OUTPATIENT_CLINIC_OR_DEPARTMENT_OTHER): Payer: Self-pay | Admitting: Emergency Medicine

## 2013-12-23 DIAGNOSIS — N189 Chronic kidney disease, unspecified: Secondary | ICD-10-CM | POA: Diagnosis present

## 2013-12-23 DIAGNOSIS — D638 Anemia in other chronic diseases classified elsewhere: Secondary | ICD-10-CM | POA: Diagnosis present

## 2013-12-23 DIAGNOSIS — J189 Pneumonia, unspecified organism: Secondary | ICD-10-CM | POA: Diagnosis present

## 2013-12-23 DIAGNOSIS — I369 Nonrheumatic tricuspid valve disorder, unspecified: Secondary | ICD-10-CM | POA: Diagnosis not present

## 2013-12-23 DIAGNOSIS — E872 Acidosis, unspecified: Secondary | ICD-10-CM | POA: Diagnosis present

## 2013-12-23 DIAGNOSIS — H3530 Unspecified macular degeneration: Secondary | ICD-10-CM | POA: Diagnosis present

## 2013-12-23 DIAGNOSIS — B070 Plantar wart: Secondary | ICD-10-CM

## 2013-12-23 DIAGNOSIS — J13 Pneumonia due to Streptococcus pneumoniae: Principal | ICD-10-CM | POA: Diagnosis present

## 2013-12-23 DIAGNOSIS — E669 Obesity, unspecified: Secondary | ICD-10-CM | POA: Diagnosis present

## 2013-12-23 DIAGNOSIS — F17200 Nicotine dependence, unspecified, uncomplicated: Secondary | ICD-10-CM

## 2013-12-23 DIAGNOSIS — G9340 Encephalopathy, unspecified: Secondary | ICD-10-CM | POA: Diagnosis present

## 2013-12-23 DIAGNOSIS — E876 Hypokalemia: Secondary | ICD-10-CM | POA: Diagnosis not present

## 2013-12-23 DIAGNOSIS — Z9884 Bariatric surgery status: Secondary | ICD-10-CM | POA: Diagnosis not present

## 2013-12-23 DIAGNOSIS — N289 Disorder of kidney and ureter, unspecified: Secondary | ICD-10-CM | POA: Diagnosis not present

## 2013-12-23 DIAGNOSIS — Z79899 Other long term (current) drug therapy: Secondary | ICD-10-CM

## 2013-12-23 DIAGNOSIS — R918 Other nonspecific abnormal finding of lung field: Secondary | ICD-10-CM | POA: Diagnosis not present

## 2013-12-23 DIAGNOSIS — R935 Abnormal findings on diagnostic imaging of other abdominal regions, including retroperitoneum: Secondary | ICD-10-CM | POA: Diagnosis not present

## 2013-12-23 DIAGNOSIS — E870 Hyperosmolality and hypernatremia: Secondary | ICD-10-CM | POA: Diagnosis not present

## 2013-12-23 DIAGNOSIS — R059 Cough, unspecified: Secondary | ICD-10-CM

## 2013-12-23 DIAGNOSIS — N179 Acute kidney failure, unspecified: Secondary | ICD-10-CM | POA: Diagnosis present

## 2013-12-23 DIAGNOSIS — R0602 Shortness of breath: Secondary | ICD-10-CM

## 2013-12-23 DIAGNOSIS — L408 Other psoriasis: Secondary | ICD-10-CM | POA: Diagnosis present

## 2013-12-23 DIAGNOSIS — I509 Heart failure, unspecified: Secondary | ICD-10-CM | POA: Diagnosis present

## 2013-12-23 DIAGNOSIS — R0902 Hypoxemia: Secondary | ICD-10-CM | POA: Diagnosis not present

## 2013-12-23 DIAGNOSIS — I129 Hypertensive chronic kidney disease with stage 1 through stage 4 chronic kidney disease, or unspecified chronic kidney disease: Secondary | ICD-10-CM | POA: Diagnosis present

## 2013-12-23 DIAGNOSIS — Z96659 Presence of unspecified artificial knee joint: Secondary | ICD-10-CM | POA: Diagnosis not present

## 2013-12-23 DIAGNOSIS — D72829 Elevated white blood cell count, unspecified: Secondary | ICD-10-CM

## 2013-12-23 DIAGNOSIS — J9819 Other pulmonary collapse: Secondary | ICD-10-CM | POA: Diagnosis not present

## 2013-12-23 DIAGNOSIS — I959 Hypotension, unspecified: Secondary | ICD-10-CM | POA: Diagnosis not present

## 2013-12-23 DIAGNOSIS — Z4682 Encounter for fitting and adjustment of non-vascular catheter: Secondary | ICD-10-CM | POA: Diagnosis not present

## 2013-12-23 DIAGNOSIS — J441 Chronic obstructive pulmonary disease with (acute) exacerbation: Secondary | ICD-10-CM | POA: Diagnosis present

## 2013-12-23 DIAGNOSIS — D509 Iron deficiency anemia, unspecified: Secondary | ICD-10-CM | POA: Diagnosis not present

## 2013-12-23 DIAGNOSIS — I10 Essential (primary) hypertension: Secondary | ICD-10-CM

## 2013-12-23 DIAGNOSIS — R6521 Severe sepsis with septic shock: Secondary | ICD-10-CM

## 2013-12-23 DIAGNOSIS — R0600 Dyspnea, unspecified: Secondary | ICD-10-CM

## 2013-12-23 DIAGNOSIS — A419 Sepsis, unspecified organism: Secondary | ICD-10-CM

## 2013-12-23 DIAGNOSIS — Z23 Encounter for immunization: Secondary | ICD-10-CM | POA: Diagnosis not present

## 2013-12-23 DIAGNOSIS — Z853 Personal history of malignant neoplasm of breast: Secondary | ICD-10-CM | POA: Diagnosis not present

## 2013-12-23 DIAGNOSIS — R05 Cough: Secondary | ICD-10-CM

## 2013-12-23 DIAGNOSIS — Z6841 Body Mass Index (BMI) 40.0 and over, adult: Secondary | ICD-10-CM

## 2013-12-23 DIAGNOSIS — J9600 Acute respiratory failure, unspecified whether with hypoxia or hypercapnia: Secondary | ICD-10-CM | POA: Diagnosis not present

## 2013-12-23 DIAGNOSIS — J9620 Acute and chronic respiratory failure, unspecified whether with hypoxia or hypercapnia: Secondary | ICD-10-CM | POA: Diagnosis not present

## 2013-12-23 DIAGNOSIS — I5033 Acute on chronic diastolic (congestive) heart failure: Secondary | ICD-10-CM | POA: Diagnosis present

## 2013-12-23 DIAGNOSIS — J438 Other emphysema: Secondary | ICD-10-CM | POA: Diagnosis not present

## 2013-12-23 LAB — TROPONIN I: Troponin I: <0.3 ng/mL (ref <0.30)

## 2013-12-23 LAB — COMPREHENSIVE METABOLIC PANEL
ALT: 59 U/L — ABNORMAL HIGH (ref 0–35)
AST: 16 U/L (ref 0–37)
Albumin: 3.1 g/dL — ABNORMAL LOW (ref 3.5–5.2)
Alkaline Phosphatase: 98 U/L (ref 39–117)
BUN: 65 mg/dL — ABNORMAL HIGH (ref 6–23)
CO2: 32 mEq/L (ref 19–32)
Calcium: 8.5 mg/dL (ref 8.4–10.5)
Chloride: 100 mEq/L (ref 96–112)
Creatinine, Ser: 1.5 mg/dL — ABNORMAL HIGH (ref 0.50–1.10)
GFR calc Af Amer: 41 mL/min — ABNORMAL LOW (ref >90)
GFR calc non Af Amer: 35 mL/min — ABNORMAL LOW (ref >90)
Glucose, Bld: 126 mg/dL — ABNORMAL HIGH (ref 70–99)
Potassium: 4.1 mEq/L (ref 3.7–5.3)
Sodium: 145 mEq/L (ref 137–147)
Total Bilirubin: 0.7 mg/dL (ref 0.3–1.2)
Total Protein: 6.9 g/dL (ref 6.0–8.3)

## 2013-12-23 LAB — CBC
HCT: 40.2 % (ref 36.0–46.0)
Hemoglobin: 11.7 g/dL — ABNORMAL LOW (ref 12.0–15.0)
MCH: 26.5 pg (ref 26.0–34.0)
MCHC: 29.1 g/dL — ABNORMAL LOW (ref 30.0–36.0)
MCV: 91.2 fL (ref 78.0–100.0)
Platelets: 221 10*3/uL (ref 150–400)
RBC: 4.41 MIL/uL (ref 3.87–5.11)
RDW: 19.7 % — ABNORMAL HIGH (ref 11.5–15.5)
WBC: 19 10*3/uL — ABNORMAL HIGH (ref 4.0–10.5)

## 2013-12-23 LAB — I-STAT CG4 LACTIC ACID, ED: Lactic Acid, Venous: 0.86 mmol/L (ref 0.5–2.2)

## 2013-12-23 LAB — PRO B NATRIURETIC PEPTIDE: Pro B Natriuretic peptide (BNP): 10387 pg/mL — ABNORMAL HIGH (ref 0–125)

## 2013-12-23 MED ORDER — DEXTROSE 5 % IV SOLN
500.0000 mg | Freq: Once | INTRAVENOUS | Status: AC
  Administered 2013-12-23: 500 mg via INTRAVENOUS

## 2013-12-23 MED ORDER — CEFTRIAXONE SODIUM 1 G IJ SOLR
INTRAMUSCULAR | Status: AC
  Filled 2013-12-23: qty 10

## 2013-12-23 MED ORDER — ALBUTEROL SULFATE (2.5 MG/3ML) 0.083% IN NEBU
5.0000 mg | INHALATION_SOLUTION | Freq: Once | RESPIRATORY_TRACT | Status: AC
  Administered 2013-12-23: 5 mg via RESPIRATORY_TRACT
  Filled 2013-12-23: qty 6

## 2013-12-23 MED ORDER — DEXTROSE 5 % IV SOLN
1.0000 g | Freq: Once | INTRAVENOUS | Status: AC
  Administered 2013-12-23: 1 g via INTRAVENOUS

## 2013-12-23 NOTE — ED Notes (Signed)
Pt states she does not take any prescription medications at home.

## 2013-12-23 NOTE — ED Notes (Signed)
Daughter states the patient has been SOB and coughing for a few weeks. She states that the patient has refused to go to the doctor and that she has not eaten much or gotten out of bed in about a week.

## 2013-12-23 NOTE — Progress Notes (Signed)
Patient'ws SPO2 measured bettween 47% and 57 % in triage.  Placed patient on 100% NRB and Patients SPO2 rose to 92 within 2 minutes and 100% within 3 minutes.  Patient weaned to partial non rebreater.  Patients SPO2 remains at 100%..  Placed Patient on 40% ventimask after her return from X-Ray.  SPO2 is now 97%,.

## 2013-12-23 NOTE — Progress Notes (Signed)
Patient's SPO2 is less than 88% no a 50% ventimask.  Placed patient on a partial non rebreather with both side flaps removed to maintain a SPO2 >= 92 %.  SPO2 is at 100% at this time.

## 2013-12-23 NOTE — ED Notes (Signed)
Increased SOB for 10 days. Pt productive cough of clear mucus

## 2013-12-23 NOTE — ED Provider Notes (Signed)
CSN: [id number]     Arrival date & time 12/23/13  2112 History  This chart was scribed for Tammy Beards, MD by Marcha Dutton, ED Scribe. This patient was seen in room  and the patient's care was started at 9:40 PM.    Chief Complaint  Patient presents with  . Shortness of Breath      Patient is a 63 y.o. female presenting with shortness of breath. The history is provided by the patient. No language interpreter was used.  Shortness of Breath Severity:  Moderate Onset quality:  Gradual Duration:  10 days Timing:  Constant Progression:  Worsening Relieved by:  Oxygen (sinus decongestants) Worsened by:  Exertion, deep breathing and movement Ineffective treatments:  Lying down and rest Associated symptoms: cough (that began months ago), fever and sputum production (gradually increasing)   Associated symptoms: no vomiting   Associated symptoms comment:  Decreased appetite, increasingly high blood pressure, low oxygen saturation levels Pt presented with  SpO2 47% on RA. Pt immediately placed on NRB by respiratory therapist pulling sats up to 100%. Relative reports she hasn't been out of bed in a week. Pt states she had a similar set of symptoms a year ago with associated bilateral lower extremity edema. Pt denies h/o COPD.  Past Medical History  Diagnosis Date  . Psoriasis   . Macular degeneration    Past Surgical History  Procedure Laterality Date  . Left knee replacement  1995  . Gastric bypass  May 2004   Family History  Problem Relation Age of Onset  . Osteoarthritis Mother   . Hypertension Mother   . Thyroid disease Mother    History  Substance Use Topics  . Smoking status: Current Every Day Smoker -- 0.50 packs/day    Types: Cigarettes  . Smokeless tobacco: Never Used     Comment: has decreased smoking  . Alcohol Use: No   OB History   Grav Para Term Preterm Abortions TAB SAB Ect Mult Living                 Review of Systems  Constitutional: Positive for  fever.  Respiratory: Positive for cough (that began months ago), sputum production (gradually increasing) and shortness of breath.   Gastrointestinal: Negative for nausea and vomiting.  All other systems reviewed and are negative.    Allergies  Review of patient's allergies indicates no known allergies.  Home Medications   Current Outpatient Rx  Name  Route  Sig  Dispense  Refill  . Ascorbic Acid (VITAMIN C CR) 1000 MG TBCR   Oral   Take 1 tablet by mouth daily.           . cetirizine (ZYRTEC ALLERGY) 10 MG tablet   Oral   Take 10 mg by mouth daily. Generic okay          . EXPIRED: ferrous sulfate 325 (65 FE) MG tablet   Oral   Take 1 tablet (325 mg total) by mouth daily with breakfast.   30 tablet   11   . fluticasone (FLONASE) 50 MCG/ACT nasal spray      2 sprays daily. In each nostril. DISP 1 unit          . EXPIRED: furosemide (LASIX) 20 MG tablet   Oral   Take 1 tablet (20 mg total) by mouth daily.   30 tablet   1   . EXPIRED: hydrochlorothiazide (MICROZIDE) 12.5 MG capsule   Oral   Take 1 capsule (  12.5 mg total) by mouth daily.   30 capsule   6    Triage Vitals: BP 128/107  Pulse 93  Temp(Src) 98 F (36.7 C) (Axillary)  Resp 24  Wt 268 lb (121.564 kg)  SpO2 47%  Physical Exam  Nursing note and vitals reviewed. Constitutional: No distress.  Ill appearing  HENT:  Head: Normocephalic and atraumatic.  Right Ear: External ear normal.  Left Ear: External ear normal.  Eyes: Conjunctivae are normal. Right eye exhibits no discharge. Left eye exhibits no discharge. No scleral icterus.  Neck: Neck supple. No tracheal deviation present.  Cardiovascular: Normal rate, regular rhythm and intact distal pulses.   Pulmonary/Chest: Effort normal. No stridor. No respiratory distress. She has wheezes (mild). She has no rales.  Dec air movement, mild expiratory wheezing in lower lung fields bilaterally  Abdominal: Soft. Bowel sounds are normal. She exhibits no  distension. There is no tenderness. There is no rebound and no guarding.  Musculoskeletal: She exhibits no edema and no tenderness.  1+ pitting edema bilaterally   Neurological: She is alert. She has normal strength. No cranial nerve deficit (no facial droop, extraocular movements intact, no slurred speech) or sensory deficit. She exhibits normal muscle tone. She displays no seizure activity. Coordination normal.  Skin: Skin is warm and dry. No rash noted.  Psychiatric: She has a normal mood and affect.    ED Course  Procedures (including critical care time)  DIAGNOSTIC STUDIES: Oxygen Saturation is 100% on NRB, normal by my interpretation.    COORDINATION OF CARE: 9:49 PM- Pt advised of plan for treatment and pt agrees.  11:15 PM d/w family practice resident, Caryl Bis, pt to be admitted to step down, attending Dr. Wendy Poet.   CRITICAL CARE Performed by: [name] Total critical care time: 45 Critical care time was exclusive of separately billable procedures and treating other patients. Critical care was necessary to treat or prevent imminent or life-threatening deterioration. Critical care was time spent personally by me on the following activities: development of treatment plan with patient and/or surrogate as well as nursing, discussions with consultants, evaluation of patient's response to treatment, examination of patient, obtaining history from patient or surrogate, ordering and performing treatments and interventions, ordering and review of laboratory studies, ordering and review of radiographic studies, pulse oximetry and re-evaluation of patient's condition.    Labs Review Labs Reviewed  CBC - Abnormal; Notable for the following:    WBC 19.0 (*)    Hemoglobin 11.7 (*)    MCHC 29.1 (*)    RDW 19.7 (*)    All other components within normal limits  PRO B NATRIURETIC PEPTIDE - Abnormal; Notable for the following:    Pro B Natriuretic peptide (BNP) 10387.0 (*)     All other components within normal limits  COMPREHENSIVE METABOLIC PANEL - Abnormal; Notable for the following:    Glucose, Bld 126 (*)    BUN 65 (*)    Creatinine, Ser 1.50 (*)    Albumin 3.1 (*)    ALT 59 (*)    GFR calc non Af Amer 35 (*)    GFR calc Af Amer 41 (*)    All other components within normal limits  CULTURE, BLOOD (ROUTINE X 2)  CULTURE, BLOOD (ROUTINE X 2)  TROPONIN I  I-STAT CG4 LACTIC ACID, ED   Imaging Review Dg Chest 2 View  12/23/2013   CLINICAL DATA:  Low oxygen saturation, shortness of breath for 10 days, productive cough  EXAM: CHEST  2 VIEW  COMPARISON:  DG CHEST 2 VIEW dated 01/12/2011  FINDINGS: Mild cardiac enlargement stable. Mild vascular congestion. No evidence of pulmonary edema. There is opacity medially in the right middle lobe, new from prior study.  IMPRESSION: Right middle lobe pneumonia.   Electronically Signed   By: Skipper Cliche M.D.   On: 12/23/2013 21:58     EKG Interpretation   Date/Time:  Sunday December 23 2013 22:06:15 EDT Ventricular Rate:  92 PR Interval:  154 QRS Duration: 80 QT Interval:  374 QTC Calculation: 462 R Axis:   44 Text Interpretation:  Normal sinus rhythm Low voltage QRS Cannot rule out  Anterior infarct , age undetermined T wave abnormality, consider  inferolateral ischemia Abnormal ECG t wave inversions in lateral leads are  new since prior EKG from 2012 Confirmed by [facility]  MD, Sehili [phone]) on  12/23/2013 11:30:55 PM      MDM   Final diagnoses:  Shortness of breath  Community acquired pneumonia  Renal insufficiency  Cough  Leukocytosis  Hypoxia    Pt presenting with shortness of breath and cough, symptoms have been present and worsening for several weeks.  Pt initially hypoxic, 100% O2 sat on nonrebreather.  CXR shows RML pneumonia,  Xray images reviewed and interpreted by me as well.  EKG reassuring, troponin negative, BNP elevated.  Pt has hx of LE edema which is not worse than baseline now.  Blood  cultures obtained, lactate normal.  Pt started on zithromax and rocephin.  Blood pressure is stable.  Will not give lasix at this time but will likely need fluid management inpatient once resuscitated from infection standpoint.  Pt and daughter updated at the bedside about plan.    I personally performed the services described in this documentation, which was scribed in my presence. The recorded information has been reviewed and is accurate.    Tammy Beards, MD 12/24/13 [phone]

## 2013-12-24 ENCOUNTER — Inpatient Hospital Stay (HOSPITAL_COMMUNITY): Payer: Medicare Other

## 2013-12-24 DIAGNOSIS — J9620 Acute and chronic respiratory failure, unspecified whether with hypoxia or hypercapnia: Secondary | ICD-10-CM

## 2013-12-24 DIAGNOSIS — J441 Chronic obstructive pulmonary disease with (acute) exacerbation: Secondary | ICD-10-CM | POA: Diagnosis present

## 2013-12-24 DIAGNOSIS — I369 Nonrheumatic tricuspid valve disorder, unspecified: Secondary | ICD-10-CM

## 2013-12-24 DIAGNOSIS — J189 Pneumonia, unspecified organism: Secondary | ICD-10-CM | POA: Diagnosis present

## 2013-12-24 LAB — BLOOD GAS, ARTERIAL
Acid-Base Excess: 3.5 mmol/L — ABNORMAL HIGH (ref 0.0–2.0)
Acid-Base Excess: 3.9 mmol/L — ABNORMAL HIGH (ref 0.0–2.0)
Acid-Base Excess: 4.5 mmol/L — ABNORMAL HIGH (ref 0.0–2.0)
Bicarbonate: 31.3 mEq/L — ABNORMAL HIGH (ref 20.0–24.0)
Bicarbonate: 31.6 mEq/L — ABNORMAL HIGH (ref 20.0–24.0)
Bicarbonate: 31.6 mEq/L — ABNORMAL HIGH (ref 20.0–24.0)
Delivery systems: POSITIVE
Delivery systems: POSITIVE
Drawn by: 24487
Drawn by: 24487
Drawn by: 234041
Expiratory PAP: 6
Expiratory PAP: 6
FIO2: 1 %
FIO2: 100 %
FIO2: 100 %
Inspiratory PAP: 12
Inspiratory PAP: 12
Mode: POSITIVE
Mode: POSITIVE
O2 Saturation: 97.5 %
O2 Saturation: 98.7 %
O2 Saturation: 99.1 %
Patient temperature: 98.6
Patient temperature: 98.4
Patient temperature: 98.4
TCO2: 33.9 mmol/L (ref 0–100)
TCO2: 34.3 mmol/L (ref 0–100)
TCO2: 34 mmol/L (ref 0–100)
pCO2 arterial: 86.4 mmHg (ref 35.0–45.0)
pCO2 arterial: 85.9 mmHg (ref 35.0–45.0)
pCO2 arterial: 77.8 mmHg (ref 35.0–45.0)
pH, Arterial: 7.184 — CL (ref 7.350–7.450)
pH, Arterial: 7.191 — CL (ref 7.350–7.450)
pH, Arterial: 7.232 — ABNORMAL LOW (ref 7.350–7.450)
pO2, Arterial: 135 mmHg — ABNORMAL HIGH (ref 80.0–100.0)
pO2, Arterial: 188 mmHg — ABNORMAL HIGH (ref 80.0–100.0)
pO2, Arterial: 250 mmHg — ABNORMAL HIGH (ref 80.0–100.0)

## 2013-12-24 LAB — CBC WITH DIFFERENTIAL/PLATELET
Basophils Absolute: 0.1 10*3/uL (ref 0.0–0.1)
Basophils Relative: 1 % (ref 0–1)
Eosinophils Absolute: 0.1 10*3/uL (ref 0.0–0.7)
Eosinophils Relative: 0 % (ref 0–5)
HCT: 38.7 % (ref 36.0–46.0)
Hemoglobin: 10.9 g/dL — ABNORMAL LOW (ref 12.0–15.0)
Lymphocytes Relative: 8 % — ABNORMAL LOW (ref 12–46)
Lymphs Abs: 1.5 10*3/uL (ref 0.7–4.0)
MCH: 25.4 pg — ABNORMAL LOW (ref 26.0–34.0)
MCHC: 28.2 g/dL — ABNORMAL LOW (ref 30.0–36.0)
MCV: 90.2 fL (ref 78.0–100.0)
Monocytes Absolute: 1.9 10*3/uL — ABNORMAL HIGH (ref 0.1–1.0)
Monocytes Relative: 10 % (ref 3–12)
Neutro Abs: 14.7 10*3/uL — ABNORMAL HIGH (ref 1.7–7.7)
Neutrophils Relative %: 81 % — ABNORMAL HIGH (ref 43–77)
Platelets: 184 10*3/uL (ref 150–400)
RBC: 4.29 MIL/uL (ref 3.87–5.11)
RDW: 17.1 % — ABNORMAL HIGH (ref 11.5–15.5)
WBC: 18.2 10*3/uL — ABNORMAL HIGH (ref 4.0–10.5)

## 2013-12-24 LAB — URINE MICROSCOPIC-ADD ON

## 2013-12-24 LAB — POCT I-STAT 3, ART BLOOD GAS (G3+)
Acid-Base Excess: 7 mmol/L — ABNORMAL HIGH (ref 0.0–2.0)
Acid-Base Excess: 8 mmol/L — ABNORMAL HIGH (ref 0.0–2.0)
Acid-Base Excess: 5 mmol/L — ABNORMAL HIGH (ref 0.0–2.0)
Bicarbonate: 36.6 mEq/L — ABNORMAL HIGH (ref 20.0–24.0)
Bicarbonate: 38.5 mEq/L — ABNORMAL HIGH (ref 20.0–24.0)
Bicarbonate: 34.8 mEq/L — ABNORMAL HIGH (ref 20.0–24.0)
O2 Saturation: 94 %
O2 Saturation: 100 %
O2 Saturation: 90 %
Patient temperature: 98.4
TCO2: 39 mmol/L (ref 0–100)
TCO2: 41 mmol/L (ref 0–100)
TCO2: 37 mmol/L (ref 0–100)
pCO2 arterial: 82.6 mmHg (ref 35.0–45.0)
pCO2 arterial: 89.1 mmHg (ref 35.0–45.0)
pCO2 arterial: 74.4 mmHg (ref 35.0–45.0)
pH, Arterial: 7.254 — ABNORMAL LOW (ref 7.350–7.450)
pH, Arterial: 7.243 — ABNORMAL LOW (ref 7.350–7.450)
pH, Arterial: 7.278 — ABNORMAL LOW (ref 7.350–7.450)
pO2, Arterial: 84 mmHg (ref 80.0–100.0)
pO2, Arterial: 335 mmHg — ABNORMAL HIGH (ref 80.0–100.0)
pO2, Arterial: 70 mmHg — ABNORMAL LOW (ref 80.0–100.0)

## 2013-12-24 LAB — PROCALCITONIN
Procalcitonin: 0.32 ng/mL
Procalcitonin: 0.33 ng/mL

## 2013-12-24 LAB — INFLUENZA PANEL BY PCR (TYPE A & B)
H1N1 flu by pcr: NOT DETECTED
Influenza A By PCR: NEGATIVE
Influenza B By PCR: NEGATIVE

## 2013-12-24 LAB — POCT I-STAT 3, VENOUS BLOOD GAS (G3P V)
Acid-Base Excess: 9 mmol/L — ABNORMAL HIGH (ref 0.0–2.0)
Bicarbonate: 38.8 mEq/L — ABNORMAL HIGH (ref 20.0–24.0)
O2 Saturation: 100 %
Patient temperature: 98.4
TCO2: 41 mmol/L (ref 0–100)
pCO2, Ven: 86.9 mmHg (ref 45.0–50.0)
pH, Ven: 7.257 (ref 7.250–7.300)
pO2, Ven: 208 mmHg — ABNORMAL HIGH (ref 30.0–45.0)

## 2013-12-24 LAB — GLUCOSE, CAPILLARY
Glucose-Capillary: 92 mg/dL (ref 70–99)
Glucose-Capillary: 103 mg/dL — ABNORMAL HIGH (ref 70–99)
Glucose-Capillary: 139 mg/dL — ABNORMAL HIGH (ref 70–99)
Glucose-Capillary: 158 mg/dL — ABNORMAL HIGH (ref 70–99)
Glucose-Capillary: 183 mg/dL — ABNORMAL HIGH (ref 70–99)

## 2013-12-24 LAB — BASIC METABOLIC PANEL
BUN: 63 mg/dL — ABNORMAL HIGH (ref 6–23)
CO2: 31 mEq/L (ref 19–32)
Calcium: 8.3 mg/dL — ABNORMAL LOW (ref 8.4–10.5)
Chloride: 101 mEq/L (ref 96–112)
Creatinine, Ser: 1.37 mg/dL — ABNORMAL HIGH (ref 0.50–1.10)
GFR calc Af Amer: 46 mL/min — ABNORMAL LOW (ref >90)
GFR calc non Af Amer: 40 mL/min — ABNORMAL LOW (ref >90)
Glucose, Bld: 99 mg/dL (ref 70–99)
Potassium: 4.8 mEq/L (ref 3.7–5.3)
Sodium: 144 mEq/L (ref 137–147)

## 2013-12-24 LAB — URINALYSIS, ROUTINE W REFLEX MICROSCOPIC
Bilirubin Urine: NEGATIVE
Glucose, UA: NEGATIVE mg/dL
Ketones, ur: NEGATIVE mg/dL
Leukocytes, UA: NEGATIVE
Nitrite: NEGATIVE
Protein, ur: NEGATIVE mg/dL
Specific Gravity, Urine: 1.014 (ref 1.005–1.030)
Urobilinogen, UA: 1 mg/dL (ref 0.0–1.0)
pH: 5 (ref 5.0–8.0)

## 2013-12-24 LAB — TROPONIN I
Troponin I: <0.3 ng/mL (ref <0.30)
Troponin I: <0.3 ng/mL (ref <0.30)
Troponin I: <0.3 ng/mL (ref <0.30)

## 2013-12-24 LAB — LEGIONELLA ANTIGEN, URINE: Legionella Antigen, Urine: NEGATIVE

## 2013-12-24 LAB — HEMOGLOBIN A1C
Hgb A1c MFr Bld: 6.1 % — ABNORMAL HIGH (ref <5.7)
Mean Plasma Glucose: 128 mg/dL — ABNORMAL HIGH (ref <117)

## 2013-12-24 LAB — MRSA PCR SCREENING: MRSA by PCR: NEGATIVE

## 2013-12-24 LAB — STREP PNEUMONIAE URINARY ANTIGEN: Strep Pneumo Urinary Antigen: POSITIVE — AB

## 2013-12-24 LAB — TSH: TSH: 1.06 u[IU]/mL (ref 0.350–4.500)

## 2013-12-24 MED ORDER — LIDOCAINE HCL (PF) 1 % IJ SOLN
INTRAMUSCULAR | Status: AC
Start: 2013-12-24 — End: 2013-12-24
  Administered 2013-12-24: 15:00:00
  Filled 2013-12-24: qty 5

## 2013-12-24 MED ORDER — ALBUTEROL SULFATE (2.5 MG/3ML) 0.083% IN NEBU: 2.5000 mg | INHALATION_SOLUTION | RESPIRATORY_TRACT | Status: DC | PRN

## 2013-12-24 MED ORDER — CHLORHEXIDINE GLUCONATE 0.12 % MT SOLN
15.0000 mL | Freq: Two times a day (BID) | OROMUCOSAL | Status: DC
  Administered 2013-12-24 – 2013-12-26 (×5): 15 mL via OROMUCOSAL
  Filled 2013-12-24 (×5): qty 15

## 2013-12-24 MED ORDER — NALOXONE HCL 0.4 MG/ML IJ SOLN: 0.4000 mg | Freq: Once | INTRAMUSCULAR | Status: DC

## 2013-12-24 MED ORDER — FENTANYL BOLUS VIA INFUSION
25.0000 ug | INTRAVENOUS | Status: DC | PRN
  Filled 2013-12-24: qty 50

## 2013-12-24 MED ORDER — MIDAZOLAM HCL 2 MG/2ML IJ SOLN
INTRAMUSCULAR | Status: AC
  Administered 2013-12-24: 2 mg
  Filled 2013-12-24: qty 4

## 2013-12-24 MED ORDER — PANTOPRAZOLE SODIUM 40 MG IV SOLR
40.0000 mg | INTRAVENOUS | Status: DC
  Administered 2013-12-24 – 2013-12-26 (×3): 40 mg via INTRAVENOUS
  Filled 2013-12-24 (×5): qty 40

## 2013-12-24 MED ORDER — NOREPINEPHRINE BITARTRATE 1 MG/ML IJ SOLN
2.0000 ug/min | INTRAMUSCULAR | Status: DC
  Administered 2013-12-24: 15 ug/min via INTRAVENOUS
  Filled 2013-12-24 (×3): qty 16

## 2013-12-24 MED ORDER — IPRATROPIUM-ALBUTEROL 0.5-2.5 (3) MG/3ML IN SOLN
3.0000 mL | RESPIRATORY_TRACT | Status: DC
  Administered 2013-12-24 – 2013-12-26 (×15): 3 mL via RESPIRATORY_TRACT
  Filled 2013-12-24 (×15): qty 3

## 2013-12-24 MED ORDER — FENTANYL CITRATE 0.05 MG/ML IJ SOLN: 50.0000 ug | INTRAMUSCULAR | Status: DC | PRN

## 2013-12-24 MED ORDER — FENTANYL CITRATE 0.05 MG/ML IJ SOLN
INTRAMUSCULAR | Status: AC
  Administered 2013-12-24: 12.5 ug via INTRAVENOUS
  Filled 2013-12-24: qty 2

## 2013-12-24 MED ORDER — BIOTENE DRY MOUTH MT LIQD
15.0000 mL | Freq: Two times a day (BID) | OROMUCOSAL | Status: DC
  Administered 2013-12-24: 15 mL via OROMUCOSAL

## 2013-12-24 MED ORDER — METHYLPREDNISOLONE SODIUM SUCC 125 MG IJ SOLR
125.0000 mg | Freq: Once | INTRAMUSCULAR | Status: AC
  Administered 2013-12-24: 125 mg via INTRAVENOUS
  Filled 2013-12-24: qty 2

## 2013-12-24 MED ORDER — DEXTROSE 5 % IV SOLN
1.0000 g | INTRAVENOUS | Status: DC
  Administered 2013-12-24 – 2013-12-27 (×4): 1 g via INTRAVENOUS
  Filled 2013-12-24 (×7): qty 10

## 2013-12-24 MED ORDER — METHYLPREDNISOLONE SODIUM SUCC 125 MG IJ SOLR
80.0000 mg | Freq: Three times a day (TID) | INTRAMUSCULAR | Status: DC
  Administered 2013-12-24 – 2013-12-26 (×6): 80 mg via INTRAVENOUS
  Filled 2013-12-24 (×9): qty 1.28

## 2013-12-24 MED ORDER — FUROSEMIDE 10 MG/ML IJ SOLN: 40.0000 mg | Freq: Once | INTRAMUSCULAR | Status: DC

## 2013-12-24 MED ORDER — FUROSEMIDE 10 MG/ML IJ SOLN
20.0000 mg | Freq: Once | INTRAMUSCULAR | Status: AC
  Administered 2013-12-24: 20 mg via INTRAVENOUS

## 2013-12-24 MED ORDER — CHLORHEXIDINE GLUCONATE 0.12 % MT SOLN: 15.0000 mL | Freq: Two times a day (BID) | OROMUCOSAL | Status: DC

## 2013-12-24 MED ORDER — FUROSEMIDE 10 MG/ML IJ SOLN
INTRAMUSCULAR | Status: AC
  Filled 2013-12-24: qty 4

## 2013-12-24 MED ORDER — METHYLPREDNISOLONE SODIUM SUCC 125 MG IJ SOLR
60.0000 mg | Freq: Four times a day (QID) | INTRAMUSCULAR | Status: DC
  Filled 2013-12-24 (×4): qty 0.96

## 2013-12-24 MED ORDER — ETOMIDATE 2 MG/ML IV SOLN
20.0000 mg | INTRAVENOUS | Status: AC
  Administered 2013-12-24: 20 mg via INTRAVENOUS

## 2013-12-24 MED ORDER — BIOTENE DRY MOUTH MT LIQD
15.0000 mL | Freq: Four times a day (QID) | OROMUCOSAL | Status: DC
  Administered 2013-12-25 – 2013-12-26 (×8): 15 mL via OROMUCOSAL

## 2013-12-24 MED ORDER — FUROSEMIDE 10 MG/ML IJ SOLN
INTRAMUSCULAR | Status: AC
  Administered 2013-12-24: 40 mg
  Filled 2013-12-24: qty 4

## 2013-12-24 MED ORDER — MIDAZOLAM HCL 2 MG/2ML IJ SOLN
2.0000 mg | Freq: Once | INTRAMUSCULAR | Status: AC
  Administered 2013-12-24: 2 mg via INTRAVENOUS

## 2013-12-24 MED ORDER — HEPARIN SODIUM (PORCINE) 5000 UNIT/ML IJ SOLN
5000.0000 [IU] | Freq: Three times a day (TID) | INTRAMUSCULAR | Status: DC
  Administered 2013-12-24 – 2013-12-31 (×21): 5000 [IU] via SUBCUTANEOUS
  Filled 2013-12-24 (×30): qty 1

## 2013-12-24 MED ORDER — FENTANYL CITRATE 0.05 MG/ML IJ SOLN
100.0000 ug | Freq: Once | INTRAMUSCULAR | Status: DC
Start: 2013-12-25 — End: 2013-12-27

## 2013-12-24 MED ORDER — NOREPINEPHRINE BITARTRATE 1 MG/ML IJ SOLN: 2.0000 ug/min | INTRAVENOUS | Status: DC

## 2013-12-24 MED ORDER — SODIUM CHLORIDE 0.9 % IV SOLN
INTRAVENOUS | Status: DC
  Administered 2013-12-24: 18:00:00 via INTRAVENOUS

## 2013-12-24 MED ORDER — BIOTENE DRY MOUTH MT LIQD
15.0000 mL | Freq: Four times a day (QID) | OROMUCOSAL | Status: DC
  Administered 2013-12-24 – 2013-12-25 (×2): 15 mL via OROMUCOSAL

## 2013-12-24 MED ORDER — FENTANYL CITRATE 0.05 MG/ML IJ SOLN
0.0000 ug/h | INTRAMUSCULAR | Status: DC
  Administered 2013-12-24 – 2013-12-25 (×2): 100 ug/h via INTRAVENOUS
  Filled 2013-12-24 (×2): qty 50

## 2013-12-24 MED ORDER — FENTANYL CITRATE 0.05 MG/ML IJ SOLN: 50.0000 ug | Freq: Once | INTRAMUSCULAR | Status: AC

## 2013-12-24 MED ORDER — CHLORHEXIDINE GLUCONATE 0.12 % MT SOLN
OROMUCOSAL | Status: AC
  Administered 2013-12-24: 15 mL
  Filled 2013-12-24: qty 15

## 2013-12-24 MED ORDER — FUROSEMIDE 10 MG/ML IJ SOLN
40.0000 mg | Freq: Once | INTRAMUSCULAR | Status: AC
  Administered 2013-12-24: 40 mg via INTRAVENOUS
  Filled 2013-12-24: qty 4

## 2013-12-24 MED ORDER — AZITHROMYCIN 500 MG PO TABS
500.0000 mg | ORAL_TABLET | Freq: Every day | ORAL | Status: DC
  Administered 2013-12-24 – 2013-12-25 (×2): 500 mg via ORAL
  Filled 2013-12-24 (×4): qty 1

## 2013-12-24 MED ORDER — FENTANYL CITRATE 0.05 MG/ML IJ SOLN
INTRAMUSCULAR | Status: AC
  Administered 2013-12-24: 100 ug
  Filled 2013-12-24: qty 4

## 2013-12-24 MED ORDER — SODIUM CHLORIDE 0.9 % IV SOLN
750.0000 mL | INTRAVENOUS | Status: DC | PRN
  Administered 2013-12-24: 750 mL via INTRAVENOUS

## 2013-12-24 MED ORDER — SODIUM CHLORIDE 0.9 % IJ SOLN
3.0000 mL | Freq: Two times a day (BID) | INTRAMUSCULAR | Status: DC
  Administered 2013-12-24 – 2013-12-26 (×4): 3 mL via INTRAVENOUS

## 2013-12-24 MED ORDER — MIDAZOLAM HCL 2 MG/2ML IJ SOLN: 2.0000 mg | Freq: Once | INTRAMUSCULAR | Status: AC

## 2013-12-24 MED ORDER — INSULIN ASPART 100 UNIT/ML ~~LOC~~ SOLN
0.0000 [IU] | SUBCUTANEOUS | Status: DC
  Administered 2013-12-24 – 2013-12-25 (×3): 3 [IU] via SUBCUTANEOUS
  Administered 2013-12-25 – 2013-12-26 (×5): 2 [IU] via SUBCUTANEOUS
  Administered 2013-12-26: 3 [IU] via SUBCUTANEOUS
  Administered 2013-12-26: 2 [IU] via SUBCUTANEOUS
  Administered 2013-12-26 (×2): 3 [IU] via SUBCUTANEOUS

## 2013-12-24 MED ORDER — PNEUMOCOCCAL VAC POLYVALENT 25 MCG/0.5ML IJ INJ
0.5000 mL | INJECTION | INTRAMUSCULAR | Status: AC
  Administered 2013-12-25: 0.5 mL via INTRAMUSCULAR
  Filled 2013-12-24: qty 0.5

## 2013-12-24 NOTE — Progress Notes (Signed)
Family Practice Teaching Service Interval Progress Note  Went back to evaluate the patient following being placed on BiPAP. She is more alert than prior to the BiPAP and more quickly answers questions. Per her family they see an improvement in her mental status and state that she has been joking around with them and stating she is hungry. She continues to not be oriented to the year. Will await the results of the repeat ABG and determine next course of action based on those results.  Tommi Rumps, MD Family Medicine PGY-2 Service Pager [phone]

## 2013-12-24 NOTE — Progress Notes (Signed)
PT Cancellation Note  Patient Details Name: [name] MRN: [id number] DOB: 04/26/1951   Cancelled Treatment:    Reason Eval Not Completed: Patient not medically ready. Now on BiPAP with FiO2 60% with sats 89%. Discussed with RN and will check back 4/14.   Jeanie Cooks Dakota Vanwart 12/24/2013, 11:21 AM Pager [phone]

## 2013-12-24 NOTE — Progress Notes (Signed)
Interim Progress Note:   Stopped by to check on [name]'s as Dr Lindell Noe with [facility] is her PCP. She is current being treated for raspatory failure due to Strep Pneumo + PNA ,and likely AECOPD and/or AECHF with Abx, Diuresis and BiPAP.  [facility] will be happy to resume care when appropriate for transferring out of ICU. We appreciate the efforts of the entire nursing and medical team involved in her care.   Olam Idler, MD, PGY-1  Pager: [phone]

## 2013-12-24 NOTE — H&P (Addendum)
PULMONARY / CRITICAL CARE MEDICINE  Name: [name] MRN: [id number] DOB: 03/14/1951    ADMISSION DATE:  12/23/2013 CONSULTATION DATE:  12/23/2013  REFERRING MD :  FMTS PRIMARY SERVICE: PCCM  CHIEF COMPLAINT:  Worsening dyspnea, cough,and lethargy  BRIEF PATIENT DESCRIPTION: 63-yo-female with poor medical follow-up with 2-week history of worsening dyspnea, cough, and lethargy found to have right middle lobe pneumonia with acute hypercarbic respiratory failure requiring BiPAP support. PCCM consulted due to continued hypercarbia on ABG following BiPAP.    SIGNIFICANT EVENTS / STUDIES:  4/13  TTE >>>  LINES / TUBES:  CULTURES: 4/13  Blood >>> 4/13  Strep Ag  >>> POSITIVE 4/13  Legionella Ag >>> 4/13  MRSA PCR >>> neg  ANTIBIOTICS: Ceftriaxone 4/13 >>> Azithromycin 4/13 >>>  INTERVAL HISTORY:  Remains of BiPAP.  Desaturates easily if mask is off.   VITAL SIGNS: Temp:  [98 F (36.7 C)-98.9 F (37.2 C)] 98.4 F (36.9 C) (04/13 1210) Pulse Rate:  [69-93] 69 (04/13 1120) Resp:  [13-24] 19 (04/13 1120) BP: (98-128)/(61-107) 102/61 mmHg (04/13 1120) SpO2:  [47 %-100 %] 90 % (04/13 1120) FiO2 (%):  [40 %-100 %] 40 % (04/13 1120) Weight:  [103.2 kg (227 lb 8.2 oz)-121.564 kg (268 lb)] 103.2 kg (227 lb 8.2 oz) (04/13 0500)  HEMODYNAMICS:   VENTILATOR SETTINGS: Vent Mode:  [-] BIPAP FiO2 (%):  [40 %-100 %] 40 % Set Rate:  [15 bmp-20 bmp] 15 bmp INTAKE / OUTPUT: Intake/Output     04/12 0701 - 04/13 0700 04/13 0701 - 04/14 0700   I.V. (mL/kg) 20 (0.2)    Total Intake(mL/kg) 20 (0.2)    Urine (mL/kg/hr) 300    Total Output 300     Net -280           PHYSICAL EXAMINATION: General: No distress Neuro:  Sleepy, but arouses to stimulation HEENT: PERRL, BiPAP mask on Cardiovascular: Regular, no murmurs Lungs: Bilateral air entry, rales Abdomen: Obese soft Musculoskeletal:  Trace edema Skin: Psoriasis on b/l LE   LABS:  CBC  Recent Labs Lab 12/23/13 2145  12/24/13 0325  WBC 19.0* 18.2*  HGB 11.7* 10.9*  HCT 40.2 38.7  PLT 221 184   BMET  Recent Labs Lab 12/23/13 2145 12/24/13 0325  NA 145 144  K 4.1 4.8  CL 100 101  CO2 32 31  BUN 65* 63*  CREATININE 1.50* 1.37*  GLUCOSE 126* 99   Electrolytes  Recent Labs Lab 12/23/13 2145 12/24/13 0325  CALCIUM 8.5 8.3*   Sepsis Markers  Recent Labs Lab 12/23/13 2302 12/24/13 0325  LATICACIDVEN 0.86  --   PROCALCITON  --  0.32   ABG  Recent Labs Lab 12/24/13 0330 12/24/13 0530 12/24/13 0717  PHART 7.191* 7.232* 7.254*  PCO2ART 85.9* 77.8* 82.6*  PO2ART 188.0* 250.0* 84.0   Liver Enzymes  Recent Labs Lab 12/23/13 2145  AST 16  ALT 59*  ALKPHOS 98  BILITOT 0.7  ALBUMIN 3.1*   Cardiac Enzymes  Recent Labs Lab 12/23/13 2145 12/24/13 0325 12/24/13 0910  TROPONINI <0.30 <0.30 <0.30  PROBNP 10387.0*  --   --    IMAGING:   Dg Chest 2 View  12/23/2013   CLINICAL DATA:  Low oxygen saturation, shortness of breath for 10 days, productive cough  EXAM: CHEST  2 VIEW  COMPARISON:  DG CHEST 2 VIEW dated 01/12/2011  FINDINGS: Mild cardiac enlargement stable. Mild vascular congestion. No evidence of pulmonary edema. There is opacity medially in the right middle lobe,  new from prior study.  IMPRESSION: Right middle lobe pneumonia.   Electronically Signed   By: Skipper Cliche M.D.   On: 12/23/2013 21:58    ASSESSMENT / PLAN:  PULMONARY A:  Acute on chronic respiratory failure Pneumococcal pneumonia ( CXR underwhelming ) L hemidiaphragm elevation, paralysis? Probable COPD with exacerbation Tobacco use disorder  P:   Goal pH>7.28 and SpO2>92 BiPAP ABG now May need intubation Duoneb scehduled and Albuterol PRN Solu-Medrol 80 q8h Sniff test when stable  CARDIOVASCULAR A:  Possible congestive heart failure P:  Goal MAP>65 TTE Trend troponin A-line CVL  RENAL A:   Acute on chronic renal failure Clinically euvolemic P:   Trend BMP CVP after CVL is  placed Defer further diuresis  GASTROINTESTINAL A:  Mildly elevated ALT  History of gastric bypass Nutrition GIPx is not indicated P:   NPO   HEMATOLOGIC A:  Normocytic Anemia  History of breast cancer VTE Px P:  Trend CBC Heparin   INFECTIOUS A:   Pneumococcal pneumonia  P:   Abx / cx as above PCT  ENDOCRINE A:  Hyperglycemia  P:   SSI  NEUROLOGIC A:  Acute encephalopathy Of note family reports patient having several empty bottles of prescription opioids at her residence  P: Narcan x 1 Drug screen  I have personally obtained history, examined patient, evaluated and interpreted laboratory and imaging results, reviewed medical records, formulated assessment / plan and placed orders.  CRITICAL CARE:  The patient is critically ill with multiple organ systems failure and requires high complexity decision making for assessment and support, frequent evaluation and titration of therapies, application of advanced monitoring technologies and extensive interpretation of multiple databases. Critical Care Time devoted to patient care services described in this note is 30 minutes.   Doree Fudge, MD Pulmonary and [place] Pager: [phone]  12/24/2013, 2:29 PM

## 2013-12-24 NOTE — Procedures (Addendum)
Arterial Catheter Insertion Procedure Note Ashleen Demma [id number] 03/03/1951  Procedure: Insertion of Arterial Catheter  Indications: Blood pressure monitoring  Procedure Details Consent: Risks of procedure as well as the alternatives and risks of each were explained to the (patient/caregiver).  Consent for procedure obtained. Time Out: Verified patient identification, verified procedure, site/side was marked, verified correct patient position, special equipment/implants available, medications/allergies/relevent history reviewed, required imaging and test results available.  Performed  Maximum sterile technique was used including antiseptics, cap, gloves, gown, hand hygiene, mask and sheet. Skin prep: Chlorhexidine; local anesthetic administered 20 gauge catheter was inserted into left radial artery using the Seldinger technique.  Evaluation Blood flow good; BP tracing good. Complications: No apparent complications.   Erick Colace 12/24/2013  Supervised and present for the entire procedure.  Doree Fudge, MD Pulmonary and [place] Pager: [phone]

## 2013-12-24 NOTE — Procedures (Addendum)
Central Venous Catheter Insertion Procedure Note Mindy Gali [id number] Feb 14, 1951  Procedure: Insertion of Central Venous Catheter Indications: Assessment of intravascular volume, Drug and/or fluid administration and Frequent blood sampling  Procedure Details Consent: Risks of procedure as well as the alternatives and risks of each were explained to the (patient/caregiver).  Consent for procedure obtained. Time Out: Verified patient identification, verified procedure, site/side was marked, verified correct patient position, special equipment/implants available, medications/allergies/relevent history reviewed, required imaging and test results available.  Performed Real time [place] used to eval and cannulate the vessel  Maximum sterile technique was used including antiseptics, cap, gloves, gown, hand hygiene, mask and sheet. Skin prep: Chlorhexidine; local anesthetic administered A antimicrobial bonded/coated triple lumen catheter was placed in the right internal jugular vein using the Seldinger technique.  Evaluation Blood flow good Complications: No apparent complications Patient did tolerate procedure well. Chest X-ray ordered to verify placement.  CXR: normal.  Erick Colace 12/24/2013, 3:56 PM  Supervised and present through the entire procedure.  Doree Fudge, MD Pulmonary and [place] Pager: [phone]

## 2013-12-24 NOTE — H&P (Signed)
[facility] Admission History and Physical Service Pager: [phone]  Patient name: [name] Medical record number: [id number] Date of birth: Jun 17, 1951 Age: 63 y.o. Gender: female  Primary Care Provider: Willeen Niece, MD Consultants: none Code Status: full  Chief Complaint: shortness of breath  Assessment and Plan: [name] is a 63 y.o. female presenting with shortness of breath. PMH is significant for HTN, tobacco abuse, and breast cancer.  # Shortness of Breath: patient with chronic dyspnea worsening over the past couple of weeks. Likely multifactorial in nature. Contributors include PNA seen on CXR, likely CHF with elevated BNP, and potential COPD with wheezes heard on ED evaluation and long smoking history. ABG revealed hypercarbic respiratory failure somewhat compensated with elevated bicarb. Unlikely to be MI related at this time as troponin was negative. -will admit to step-down, Attending Dr McDiarmid -will start on BiPAP at this time for hypercarbic respiratory failure in likely COPD patient -low threshold for contact CCM for evaluation -will recheck ABG 1 hr post BiPAP -rocephin and azithro for CAP coverage -will dose lasix 20 mg IV for diuresis -check strep and legionella urine antigens -strict I/O's -BCx drawn in ED -will cycle troponins given new changes on EKG -consider addition of steroid if not improving for potential COPD exacerbation component  # AKI: patient with elevation in Cr to 1.5, previous values wnl. Potentially this is related to volume status vs a new baseline for a patient with no medical f/u in the past 2 years. -will give dose of lasix 20 mg now and monitor I/O's -will likely need to redose lasix later in the morning pending her response  # Anemia: patient with hgb 11.7 now. Previously was 10.7 2 years ago. Is normocytic. Likely anemia of chronic disease. -will trend hgb  -consider anemia panel if it declines or  does not improve  # Leukocytosis: likely related to infectious process of PNA. -will trend this and monitor for response while on antibiotics  FEN/GI: NPO, KVO Prophylaxis: heparin SQ  Disposition: admit to step down, discharge pending improvement in respiratory status  History of Present Illness: [name] is a 63 y.o. female presenting with shortness of breath. Patient gives some of the history, though most of the history is provided by her daughters.  Over the past 2 weeks the patient has become more short of breath. They note over the past several months she has been wheezing and coughing more, but this has worsened over the past couple of weeks. They note she has not gotten out of bed during this time due tot he shortness of breath. She has been getting short of breath walking to the bathroom. She notes 3 pillow orthopnea and PND. She notes a several year history of LE edema. Per her daughter's report the patient has had a low-grade fever at home over the past week and has had an O2 sat of 75%. They also note her blood pressure has been elevated. They note that she has been taking OTC medications in an attempt to treat this. She does not endorse any medical problems and has not seen a doctor in 2 years. Her family notes that she has smoked cigarettes for 40+ years, they can not say how much.  Note the patient had an echo in 2012 that revealed LVH and an EF 55-60% with normal wall function.  In the ED she had a CXR that revealed a RML PNA. Her BNP was 10387. She had a negative troponin. She had a  WBC to 19.  Review Of Systems: Per HPI with the following additions: none Otherwise 12 point review of systems was performed and was unremarkable.  Patient Active Problem List   Diagnosis Date Noted  . CAP (community acquired pneumonia) 12/24/2013  . Pneumonia 12/23/2013  . Shoulder and upper arm, abrasion or friction burn, without mention of infection 05/03/2012  . Fall down stairs  12/31/2011  . Hypoalbuminemia 11/23/2011  . Anemia, iron deficiency 10/26/2011  . Hypocalcemia 10/26/2011  . Impaired fasting glucose 10/26/2011  . Fatigue 10/26/2011  . Lower extremity edema 10/20/2011  . Dyspnea 01/12/2011  . Bibasilar crackles 01/12/2011  . Osteoarthritis 01/04/2011  . TOBACCO ABUSE 08/14/2009  . ALLERGIC RHINITIS CAUSE UNSPECIFIED 08/14/2009  . ABNORMAL WEIGHT GAIN 08/14/2009  . Hypertension 08/14/2009  . PLANTAR WART, RIGHT 12/06/2008  . PSORIASIS 12/06/2008  . BREAST CANCER, HX OF 12/06/2008   Past Medical History: Past Medical History  Diagnosis Date  . Psoriasis   . Macular degeneration    Past Surgical History: Past Surgical History  Procedure Laterality Date  . Left knee replacement  1995  . Gastric bypass  May 2004   Social History: History  Substance Use Topics  . Smoking status: Current Every Day Smoker -- 0.50 packs/day    Types: Cigarettes  . Smokeless tobacco: Never Used     Comment: has decreased smoking  . Alcohol Use: No   Additional social history: none  Please also refer to relevant sections of EMR.  Family History: Family History  Problem Relation Age of Onset  . Osteoarthritis Mother   . Hypertension Mother   . Thyroid disease Mother    Allergies and Medications: No Known Allergies No current facility-administered medications on file prior to encounter.   Current Outpatient Prescriptions on File Prior to Encounter  Medication Sig Dispense Refill  . Ascorbic Acid (VITAMIN C CR) 1000 MG TBCR Take 1 tablet by mouth daily.        . cetirizine (ZYRTEC ALLERGY) 10 MG tablet Take 10 mg by mouth daily. Generic okay       . ferrous sulfate 325 (65 FE) MG tablet Take 1 tablet (325 mg total) by mouth daily with breakfast.  30 tablet  11  . fluticasone (FLONASE) 50 MCG/ACT nasal spray 2 sprays daily. In each nostril. DISP 1 unit       . furosemide (LASIX) 20 MG tablet Take 1 tablet (20 mg total) by mouth daily.  30 tablet  1  .  hydrochlorothiazide (MICROZIDE) 12.5 MG capsule Take 1 capsule (12.5 mg total) by mouth daily.  30 capsule  6    Objective: BP 117/62  Pulse 87  Temp(Src) 98.4 F (36.9 C) (Axillary)  Resp 19  Ht 5\' 5"  (1.651 m)  Wt 268 lb (121.564 kg)  BMI 44.60 kg/m2  SpO2 99% Exam: General: NAD, resting in bed, slow to answer questions HEENT: NCAT, PERRL, MMM Cardiovascular: rrr, no murmurs appreciated Respiratory: increased respiratory rate, bilateral crackles R>L and greater in bilateral bases, minimal scattered wheezes, decreased air movement bibasilar  Abdomen: s, NT, ND Extremities: no edema, chronic appearing skin changes in bilateral LE around ankles to mid lower shin Skin: no other lesions noted Neuro: oriented to person and city, not to time or building, answers very slowly Psych: flat affect, slow to answer questions on exam  Labs and Imaging: CBC BMET   Recent Labs Lab 12/23/13 2145  WBC 19.0*  HGB 11.7*  HCT 40.2  PLT 221  Recent Labs Lab 12/23/13 2145  NA 145  K 4.1  CL 100  CO2 32  BUN 65*  CREATININE 1.50*  GLUCOSE 126*  CALCIUM 8.5     BNP 10387 Lactic acid 0.86 Troponin neg  ABG    Component Value Date/Time   PHART 7.184* 12/24/2013 0209   PCO2ART 86.4* 12/24/2013 0209   PO2ART 135.0* 12/24/2013 0209   HCO3 31.3* 12/24/2013 0209   TCO2 33.9 12/24/2013 0209   O2SAT 97.5 12/24/2013 0209   EKG: new T-wave inversion V3-V6 CXR: RML PNA   Leone Haven, MD 12/24/2013, 2:08 AM PGY-2, [facility] pager: [phone], text pages welcome

## 2013-12-24 NOTE — Procedures (Signed)
Intubation Procedure Note [name] [id number] 1951/06/29  Procedure: Intubation Indications: Respiratory insufficiency  Procedure Details Consent: Risks of procedure as well as the alternatives and risks of each were explained to the (patient/caregiver).  Consent for procedure obtained. Time Out: Verified patient identification, verified procedure, site/side was marked, verified correct patient position, special equipment/implants available, medications/allergies/relevent history reviewed, required imaging and test results available.  Performed  Maximum sterile technique was used including cap, gloves and gown.  MAC    Evaluation Hemodynamic Status: BP stable throughout; O2 sats: stable throughout Patient's Current Condition: stable Complications: No apparent complications Patient did tolerate procedure well. Chest X-ray ordered to verify placement.  CXR: ETT needs adv    Erick Colace 12/24/2013  Supervised by me.  Rigoberto Noel MD

## 2013-12-24 NOTE — Progress Notes (Signed)
UR Completed.  Mylei Brackeen Jane Else Habermann [phone] 12/24/2013  

## 2013-12-24 NOTE — Progress Notes (Signed)
Family Practice Teaching Service Interval Progress Note  ABG post BiPAP returned unchanged. Patient continues to be confused and lethargic. Discussed with Dr Titus Mould and will transfer to the ICU for potential intubation. CCM team currently evaluating the patient. I appreciate their help in this case.   Tommi Rumps, MD Family Medicine PGY-2 Service Pager [phone]

## 2013-12-24 NOTE — Progress Notes (Signed)
  Echocardiogram 2D Echocardiogram has been performed.  [name] 12/24/2013, 1:25 PM

## 2013-12-24 NOTE — H&P (Signed)
I have seen and examined this patient. I have discussed with Dr Caryl Bis.  I agree with their findings and plans as documented in their admission note.  Patient' transferred to [place] for management of Acute hypercapnic Respiratory Failure and possible intubation for Mechanical Ventilation.

## 2013-12-24 NOTE — H&P (Signed)
PULMONARY / CRITICAL CARE MEDICINE   Name: [name] MRN: [id number] DOB: [birth year]    ADMISSION DATE:  12/23/2013 CONSULTATION DATE:  12/23/2013  REFERRING MD :  Family Medicine  PRIMARY SERVICE: PCCM  CHIEF COMPLAINT:  Worsening dyspnea, cough,and lethargy  BRIEF PATIENT DESCRIPTION: 63-yo-female with poor medical follow-up with 2-week history of worsening dyspnea, cough, and lethargy found to have right middle lobe pneumonia with acute hypercarbic respiratory failure requiring bipap support. PCCM consulted due to continued hypercarbia on ABG following biapap.    SIGNIFICANT EVENTS / STUDIES:  12/24/13 Admitted for acute hypercarbic respiratory requiring Bipap with continued hypercarbia. CXR with right middle lobe pneumonia and positive strep pneumoniae urine antigen. Also with pulmonary congestion and elevated pro-BNP.      LINES / TUBES:   CULTURES: BC 4/13 >> Strep Pneum 4/13 >> Pos Legionella 4/13 >> MRSA PCR 4/13 >> NEG  ANTIBIOTICS: IV ceftriaxone 4/13 >> IV Azithromycin 4/13 >>  HISTORY OF PRESENT ILLNESS:   [name] is a 63-yo-female with poor medical follow-up who presents as transfer from [facility] with 2-week history of worsening dyspnea, cough, and lethargy.   Per family she has been slowly declining over the past 2 weeks with what first started as sinusitis/allergy symptoms unrelieved with OTC medications that then progressed to subjective fever, worsening dyspnea, and worsening productive cough. At baseline she has productive cough for many years and is a +40 yr smoker. Her oxygen saturation last week was 75% on pulse oximetry measured at home. She has also been progressively more lethargic, weak, depressed, confused, with decreased PO intake in the past few weeks and unable to even get up to use the bathroom or bathe herself. Per family she has been putting off seeing a doctor and does not have proper medical care. She denies wheezing, LE  edema, or recent weight gain.   On arrival to [facility],  pt was found to be confused with acute hypercarbic respiratory failure requiring bipap support. PCCM was consulted due to continued hypercarbia on ABG following biapap.   PAST MEDICAL HISTORY :  Past Medical History  Diagnosis Date  . Psoriasis   . Macular degeneration    Past Surgical History  Procedure Laterality Date  . Left knee replacement  1995  . Gastric bypass  May 2004   Prior to Admission medications   Medication Sig Start Date End Date Taking? Authorizing Provider  Ascorbic Acid (VITAMIN C CR) 1000 MG TBCR Take 1 tablet by mouth daily.      Historical Provider, MD  cetirizine (ZYRTEC ALLERGY) 10 MG tablet Take 10 mg by mouth daily. Generic okay     Historical Provider, MD  ferrous sulfate 325 (65 FE) MG tablet Take 1 tablet (325 mg total) by mouth daily with breakfast. 10/28/11 10/27/12  Willeen Niece, MD  fluticasone (FLONASE) 50 MCG/ACT nasal spray 2 sprays daily. In each nostril. DISP 1 unit     Historical Provider, MD  furosemide (LASIX) 20 MG tablet Take 1 tablet (20 mg total) by mouth daily. 12/31/11 12/30/12  Willeen Niece, MD  hydrochlorothiazide (MICROZIDE) 12.5 MG capsule Take 1 capsule (12.5 mg total) by mouth daily. 10/26/11 10/25/12  Willeen Niece, MD   No Known Allergies  FAMILY HISTORY:  Family History  Problem Relation Age of Onset  . Osteoarthritis Mother   . Hypertension Mother   . Thyroid disease Mother    SOCIAL HISTORY:  reports that she has been smoking Cigarettes.  She  has been smoking about 0.50 packs per day. She has never used smokeless tobacco. She reports that she does not drink alcohol. Her drug history is not on file.  REVIEW OF SYSTEMS:   Full ROS unable to obtain due to mental status   SUBJECTIVE:   VITAL SIGNS: Temp:  [98 F (36.7 C)-98.4 F (36.9 C)] 98.4 F (36.9 C) (04/13 0057) Pulse Rate:  [76-93] 76 (04/13 0407) Resp:  [13-24] 20 (04/13 0407) BP: (105-128)/(61-107)  105/62 mmHg (04/13 0407) SpO2:  [47 %-100 %] 100 % (04/13 0407) Weight:  [121.564 kg (268 lb)] 121.564 kg (268 lb) (04/12 2120) HEMODYNAMICS:   VENTILATOR SETTINGS:   INTAKE / OUTPUT: Intake/Output     04/12 0701 - 04/13 0700   I.V. (mL/kg) 20 (0.2)   Total Intake(mL/kg) 20 (0.2)   Net +20         PHYSICAL EXAMINATION: General: Middle aged woman with acute respiratory distress on bipap   Neuro:  A & O x 3 HEENT: normocephalic and nontraumatic  Cardiovascular: normal regular rate and rhythm  Lungs: moderately reducedair flow, coarse breath sounds, bibasilar crackles R>L   No wheeze Abdomen: soft, non-distended, non-tender, normal BS Musculoskeletal:  trace LE edema Skin: psoriasis on b/l LE  mildy lethargic but improving. Answers questions  LABS:  CBC  Recent Labs Lab 12/23/13 2145 12/24/13 0325  WBC 19.0* 18.2*  HGB 11.7* 10.9*  HCT 40.2 38.7  PLT 221 184   BMET  Recent Labs Lab 12/23/13 2145 12/24/13 0325  NA 145 144  K 4.1 4.8  CL 100 101  CO2 32 31  BUN 65* 63*  CREATININE 1.50* 1.37*  GLUCOSE 126* 99   Electrolytes  Recent Labs Lab 12/23/13 2145 12/24/13 0325  CALCIUM 8.5 8.3*   Sepsis Markers  Recent Labs Lab 12/23/13 2302  LATICACIDVEN 0.86   ABG  Recent Labs Lab 12/24/13 0209 12/24/13 0330  PHART 7.184* 7.191*  PCO2ART 86.4* 85.9*  PO2ART 135.0* 188.0*   Liver Enzymes  Recent Labs Lab 12/23/13 2145  AST 16  ALT 59*  ALKPHOS 98  BILITOT 0.7  ALBUMIN 3.1*   Cardiac Enzymes  Recent Labs Lab 12/23/13 2145  TROPONINI <0.30  PROBNP 10387.0*    Imaging Dg Chest 2 View  12/23/2013   CLINICAL DATA:  Low oxygen saturation, shortness of breath for 10 days, productive cough  EXAM: CHEST  2 VIEW  COMPARISON:  DG CHEST 2 VIEW dated 01/12/2011  FINDINGS: Mild cardiac enlargement stable. Mild vascular congestion. No evidence of pulmonary edema. There is opacity medially in the right middle lobe, new from prior study.   IMPRESSION: Right middle lobe pneumonia.   Electronically Signed   By: Skipper Cliche M.D.   On: 12/23/2013 21:58      ASSESSMENT / PLAN:  PULMONARY A: Acute hypercarbic respiratory failure    - Initial ABG with pH 7.184 with pCO2 86.4, slightly improved to 7.191, 85.9. Currently on Bipap support, likely will need intubation. Baseline CO2 unknown. Most likely secondary to strep pneum infection and probable AECOPD and AECHF  Right Middle Lobe Strep Pneumoniae    -fever, dyspnea, and cough with leukocytosis Probable undiagnosed COPD   - no wheezing on exam  Mild Pulmonary Venous Congestion   - received 1 dose 20 mg Lasix on admission Tobacco Abuse    -40+ smoker  P:   Continue bipap, repeat ABG, if no improvement will need intubation Continue IV ceftriaxone and azithromycin  IV lasix 80 mg x1 Solumedrol  125 mg x1 Breathing treatment if needed Will need PFT as outpatient Encourage tobacco cessation  CARDIOVASCULAR A: Possible Undiagnosed Acute on Chronic diastolic CHF    -Mild pulmonary venous congestion on CXR,  elevated pro-BNP >10K P:  Cycle troponins (neg x1) Obtain 2D-echo Monitor daily weight and I & O's  RENAL A:  AKI on probable CKD   - Cr 1.50 on admission, baseline Cr 0.5 (?) P:   IV lasix 80 x 1 Monitor volume status  Replace electrolytes as needed   GASTROINTESTINAL A: Mildly elevated ALT   - elevated ALT (59) of unknown etiology.  History of gastric bypass P:   NPO  Place on IV 40 mg protonix daily if intubated Consider acute hepatitis panel   HEMATOLOGIC/Oncology  A: Normocytic Anemia    -Anemia panel from 10/26/11 with iron deficiency (ferritin 8)  History of breast cancer DVT Ppx P:  SQ heparin TID  Consider anemia panel   INFECTIOUS A:  Right Lower Lobe Strep Pneumoniae Infection  - currently afebrile with leukocytosis, lactic acid wnl Leukocytosis with neutrophilia    -secondary to above P:   Continue IV ceftriaxone and  azithromycin F/U blood cultures, Legionella Ag Obtain baseline PCT  ENDOCRINE A: Hyperglycemia  P:   Consider checking TSH and A1c Start ISS if CBG > 180  NEUROLOGIC A: Acute Encephalopathy   -likely due to hypercarbia  Probable Depression   -not currently on anti-depressant therapy Macular Degeneration P: Improve respiratory status  Continue to monitor  Code: Full (confirmed with family present at bedside)    Juluis Mire PGY-1 IMTS   12/24/2013, 5:06 AM   Patient seen and examined with Dr. Naaman Plummer.We discussed all aspects of the encounter. I agree with the assessment and plan as stated above. [name] is 63 yo smoker with COPD admitted with respiratory failure and lethargy in setting or R-sided strep pneumoniae PNA. ABGs improving slowly on BiPaP. Now more alert. I have given her 80mg  IV lasix and 1 dose solumedrol as well. Case discussed with Dr. Titus Mould. Will continue BiPAP and transfer to 26M for closer monitoring. Family updated at bedside.   Daniel R Bensimhon,MD 7:05 AM

## 2013-12-24 NOTE — Progress Notes (Signed)
Worsening mental status - rpt ABG shows persistent resp acidosis on Bipap Intubated CXR reviewed - ETT to be pushed down 3 cm Hypotensive  - given fluid bolus, started on levophed gtt, A-line placed, hold further lasix Vent waveforms reviewed - keep RR 16 to avoid autoPEEP, ct steroids / nebs for bronchospasm fent gtt for sedation, may need versed gtt Addn cc time independent of procedures x 30 mins  Rigoberto Noel MD

## 2013-12-24 NOTE — Progress Notes (Signed)
2 gold colored hoop earings, 1 gold colored stud with clear stone; 1 gold colored ring with green colored stones, and 1 gold colored ring with red stone, and set of dentures returned to family.  Lenor Coffin, RN

## 2013-12-25 ENCOUNTER — Inpatient Hospital Stay (HOSPITAL_COMMUNITY): Payer: Medicare Other

## 2013-12-25 DIAGNOSIS — J189 Pneumonia, unspecified organism: Secondary | ICD-10-CM

## 2013-12-25 DIAGNOSIS — J441 Chronic obstructive pulmonary disease with (acute) exacerbation: Secondary | ICD-10-CM

## 2013-12-25 LAB — POCT I-STAT 3, ART BLOOD GAS (G3+)
Acid-Base Excess: 9 mmol/L — ABNORMAL HIGH (ref 0.0–2.0)
Bicarbonate: 35.5 mEq/L — ABNORMAL HIGH (ref 20.0–24.0)
O2 Saturation: 87 %
TCO2: 37 mmol/L (ref 0–100)
pCO2 arterial: 58 mmHg (ref 35.0–45.0)
pH, Arterial: 7.395 (ref 7.350–7.450)
pO2, Arterial: 55 mmHg — ABNORMAL LOW (ref 80.0–100.0)

## 2013-12-25 LAB — BASIC METABOLIC PANEL
BUN: 65 mg/dL — ABNORMAL HIGH (ref 6–23)
CO2: 28 mEq/L (ref 19–32)
Calcium: 8.6 mg/dL (ref 8.4–10.5)
Chloride: 104 mEq/L (ref 96–112)
Creatinine, Ser: 1.14 mg/dL — ABNORMAL HIGH (ref 0.50–1.10)
GFR calc Af Amer: 57 mL/min — ABNORMAL LOW (ref >90)
GFR calc non Af Amer: 49 mL/min — ABNORMAL LOW (ref >90)
Glucose, Bld: 102 mg/dL — ABNORMAL HIGH (ref 70–99)
Potassium: 4.1 mEq/L (ref 3.7–5.3)
Sodium: 148 mEq/L — ABNORMAL HIGH (ref 137–147)

## 2013-12-25 LAB — GLUCOSE, CAPILLARY
Glucose-Capillary: 113 mg/dL — ABNORMAL HIGH (ref 70–99)
Glucose-Capillary: 124 mg/dL — ABNORMAL HIGH (ref 70–99)
Glucose-Capillary: 137 mg/dL — ABNORMAL HIGH (ref 70–99)
Glucose-Capillary: 140 mg/dL — ABNORMAL HIGH (ref 70–99)
Glucose-Capillary: 146 mg/dL — ABNORMAL HIGH (ref 70–99)
Glucose-Capillary: 170 mg/dL — ABNORMAL HIGH (ref 70–99)
Glucose-Capillary: 89 mg/dL (ref 70–99)

## 2013-12-25 LAB — CBC WITH DIFFERENTIAL/PLATELET
Basophils Absolute: 0.2 10*3/uL — ABNORMAL HIGH (ref 0.0–0.1)
Basophils Relative: 1 % (ref 0–1)
Eosinophils Absolute: 0 10*3/uL (ref 0.0–0.7)
Eosinophils Relative: 0 % (ref 0–5)
HCT: 38.1 % (ref 36.0–46.0)
Hemoglobin: 10.7 g/dL — ABNORMAL LOW (ref 12.0–15.0)
Lymphocytes Relative: 4 % — ABNORMAL LOW (ref 12–46)
Lymphs Abs: 0.7 10*3/uL (ref 0.7–4.0)
MCH: 24.8 pg — ABNORMAL LOW (ref 26.0–34.0)
MCHC: 28.1 g/dL — ABNORMAL LOW (ref 30.0–36.0)
MCV: 88.2 fL (ref 78.0–100.0)
Monocytes Absolute: 0.6 10*3/uL (ref 0.1–1.0)
Monocytes Relative: 4 % (ref 3–12)
Neutro Abs: 14.4 10*3/uL — ABNORMAL HIGH (ref 1.7–7.7)
Neutrophils Relative %: 91 % — ABNORMAL HIGH (ref 43–77)
Platelets: 182 10*3/uL (ref 150–400)
RBC: 4.32 MIL/uL (ref 3.87–5.11)
RDW: 17.1 % — ABNORMAL HIGH (ref 11.5–15.5)
WBC: 15.8 10*3/uL — ABNORMAL HIGH (ref 4.0–10.5)

## 2013-12-25 LAB — PHOSPHORUS: Phosphorus: 4.5 mg/dL (ref 2.3–4.6)

## 2013-12-25 LAB — MAGNESIUM: Magnesium: 2.3 mg/dL (ref 1.5–2.5)

## 2013-12-25 MED ORDER — VITAL HIGH PROTEIN PO LIQD
1000.0000 mL | ORAL | Status: DC
  Administered 2013-12-25: 1000 mL
  Filled 2013-12-25 (×5): qty 1000

## 2013-12-25 MED ORDER — PRO-STAT SUGAR FREE PO LIQD
30.0000 mL | Freq: Four times a day (QID) | ORAL | Status: DC
  Administered 2013-12-25 – 2013-12-26 (×4): 30 mL
  Filled 2013-12-25 (×11): qty 30

## 2013-12-25 MED ORDER — VITAL HIGH PROTEIN PO LIQD
1000.0000 mL | ORAL | Status: DC
  Filled 2013-12-25 (×2): qty 1000

## 2013-12-25 NOTE — Progress Notes (Signed)
PT Cancellation Note  Patient Details Name: Anahid Eskelson MRN: [id number] DOB: 03/24/1951   Cancelled Treatment:    Reason Eval Not Completed: Patient not medically ready. Pt now intubated. RN reports they just increased her FiO2. Will monitor for ability to incr her activity/proceed with PT eval.   Jeanie Cooks Yaakov Saindon 12/25/2013, 8:39 AM Pager [phone]

## 2013-12-25 NOTE — Progress Notes (Signed)
Interim Note:   Stopped by to check on [name]'s as Dr Lindell Noe with [facility] is her PCP. She is current being treated for raspatory failure due to Strep Pneumo + PNA & AECOPD requiring intubation yesterday +/- AECHF.  FMTS will be happy to resume care when appropriate for transferring out of ICU. We appreciate the efforts of the entire nursing and medical team involved in her care.   Olam Idler, MD, PGY-1  Pager: [phone]

## 2013-12-25 NOTE — Progress Notes (Signed)
INITIAL NUTRITION ASSESSMENT  DOCUMENTATION CODES Per approved criteria  -Obesity Unspecified   INTERVENTION: Utilize 40M PEPuP Protocol: initiate TF via NGT with Vital HP formula at 25 ml/h and Prostat 30 ml QID on day 1; on day 2, increase to goal rate of 35 ml/h (840 ml per day) to provide 1240 kcals, 133 gm protein, 702 ml free water daily  RD to follow for nutrition care plan  NUTRITION DIAGNOSIS: Inadequate oral intake related to inability to eat as evidenced by NPO status   Goal: Enteral nutrition to provide 60-70% of estimated calorie needs (22-25 kcals/kg ideal body weight) and 100% of estimated protein needs, based on ASPEN guidelines for permissive underfeeding in critically ill obese individuals  Monitor:  TF regimen & tolerance, respiratory status, weight, labs, I/O's  Reason for Assessment: Consult  63 y.o. female  Admitting Dx: worsening dyspnea, cough, lethargy  ASSESSMENT: 63-yo-female with poor medical follow-up with 2-week history of worsening dyspnea, cough, and lethargy found to have right middle lobe pneumonia with acute hypercarbic respiratory failure requiring BiPAP support. PCCM consulted due to continued hypercarbia on ABG following BiPAP.   Patient is currently intubated on ventilator support -- NGT in place MV: 10 L/min Temp (24hrs), Avg:98.4 F (36.9 C), Min:97.4 F (36.3 C), Max:98.8 F (37.1 C)   Noted family reported patient having several empty bottles of prescription opioids at her residence.  RD consulted for TF initiation & management.  Height: Ht Readings from Last 1 Encounters:  12/24/13 5\' 5"  (1.651 m)    Weight: Wt Readings from Last 1 Encounters:  12/25/13 229 lb 8 oz (104.1 kg)    Ideal Body Weight: 125 lb  % Ideal Body Weight: 183%  Wt Readings from Last 10 Encounters:  12/25/13 229 lb 8 oz (104.1 kg)  12/31/11 268 lb 4.8 oz (121.7 kg)  10/26/11 280 lb (127.007 kg)  10/19/11 287 lb 14.4 oz (130.591 kg)  01/20/11 252  lb (114.306 kg)  01/12/11 263 lb (119.296 kg)  01/04/11 271 lb (122.925 kg)  08/14/09 232 lb (105.235 kg)  12/06/08 225 lb 12.8 oz (102.422 kg)    Usual Body Weight: unable to obtain  % Usual Body Weight: ---  BMI:  Body mass index is 38.19 kg/(m^2).  Estimated Nutritional Needs: Kcal: 6237 Protein: 130-140 gm Fluid: per MD  Skin: Intact  Diet Order: NPO  EDUCATION NEEDS: -No education needs identified at this time   Intake/Output Summary (Last 24 hours) at 12/25/13 1003 Last data filed at 12/25/13 0900  Gross per 24 hour  Intake 1264.02 ml  Output   1410 ml  Net -145.98 ml    Labs:   Recent Labs Lab 12/23/13 2145 12/24/13 0325 12/25/13 0320  NA 145 144 148*  K 4.1 4.8 4.1  CL 100 101 104  CO2 32 31 28  BUN 65* 63* 65*  CREATININE 1.50* 1.37* 1.14*  CALCIUM 8.5 8.3* 8.6  MG  --   --  2.3  PHOS  --   --  4.5  GLUCOSE 126* 99 102*    CBG (last 3)   Recent Labs  12/24/13 2340 12/25/13 0406 12/25/13 0749  GLUCAP 137* 89 113*    Scheduled Meds: . antiseptic oral rinse  15 mL Mouth Rinse QID  . azithromycin  500 mg Oral Daily  . cefTRIAXone (ROCEPHIN)  IV  1 g Intravenous Q24H  . chlorhexidine  15 mL Mouth Rinse BID  . fentaNYL  100 mcg Intravenous Once  . furosemide  40 mg Intravenous Once  . heparin  5,000 Units Subcutaneous 3 times per day  . insulin aspart  0-15 Units Subcutaneous 6 times per day  . ipratropium-albuterol  3 mL Nebulization Q4H  . methylPREDNISolone (SOLU-MEDROL) injection  80 mg Intravenous 3 times per day  . naLOXone (NARCAN)  injection  0.4 mg Intravenous Once  . pantoprazole (PROTONIX) IV  40 mg Intravenous Q24H  . pneumococcal 23 valent vaccine  0.5 mL Intramuscular Tomorrow-1000  . sodium chloride  3 mL Intravenous Q12H    Continuous Infusions: . sodium chloride 10 mL/hr at 12/24/13 1759  . fentaNYL infusion INTRAVENOUS 100 mcg/hr (12/24/13 1759)  . norepinephrine (LEVOPHED) Adult infusion Stopped (12/25/13 0800)     Past Medical History  Diagnosis Date  . Psoriasis   . Macular degeneration     Past Surgical History  Procedure Laterality Date  . Left knee replacement  1995  . Gastric bypass  May 2004    Arthur Holms, [place], LDN Pager #: [phone] After-Hours Pager #: [phone]

## 2013-12-25 NOTE — Progress Notes (Signed)
UR Completed.  [name] [name] [phone] 12/25/2013  

## 2013-12-25 NOTE — Progress Notes (Addendum)
PULMONARY / CRITICAL CARE MEDICINE   Name: [name] MRN: [id number] DOB: 26-Jun-1951    ADMISSION DATE:  12/23/2013 CONSULTATION DATE:  12/25/2013  REFERRING MD :  FMTS PRIMARY SERVICE: PCCM  BRIEF PATIENT DESCRIPTION: 63-yo-female with poor medical follow-up with 2-week history of worsening dyspnea, cough, and lethargy found to have right middle lobe pneumonia with acute hypercarbic respiratory failure requiring BiPAP support. PCCM consulted due to continued hypercarbia on ABG following BiPAP  SIGNIFICANT EVENTS / STUDIES:  4/13 TTE >>> EF 08-65%, grade 1 diastolic dysfunction, severe dilated right atrium  LINES / TUBES: OETT 4/13 >>> OGT 4/13 >>> R IJ TLC 4/13 >>> R R AL 4/13 >>> Foley 4/13 >>>  CULTURES: 4/13 Respiratory >>> 4/13 Blood 4/13 Urine Legionella >>> neg 4/13 Flu PCR >>> neg 4/13 Urine strep Ag >>> POSITIVE  ANTIBIOTICS: 4/13 Ceftriaxone>>> 4/13 Azithromycin>>>   INTERVAL HISTORY:   VITAL SIGNS: Temp:  [98.4 F (36.9 C)-98.9 F (37.2 C)] 98.5 F (36.9 C) (04/14 0407) Pulse Rate:  [59-90] 74 (04/14 0515) Resp:  [15-25] 16 (04/14 0515) BP: (98-133)/(36-66) 112/51 mmHg (04/14 0324) SpO2:  [86 %-100 %] 93 % (04/14 0515) Arterial Line BP: (65-111)/(41-99) 92/73 mmHg (04/13 1800) FiO2 (%):  [40 %-100 %] 50 % (04/14 0324) Weight:  [229 lb 8 oz (104.1 kg)] 229 lb 8 oz (104.1 kg) (04/14 0330)  HEMODYNAMICS: CVP:  [7 mmHg-12 mmHg] 7 mmHg  VENTILATOR SETTINGS: Vent Mode:  [-] PRVC FiO2 (%):  [40 %-100 %] 50 % Set Rate:  [15 bmp-16 bmp] 16 bmp Vt Set:  [500 mL] 500 mL PEEP:  [5 cmH20] 5 cmH20 Plateau Pressure:  [18 cmH20] 18 cmH20 INTAKE / OUTPUT: Intake/Output     04/13 0701 - 04/14 0700   I.V. (mL/kg) 1192.8 (11.5)   NG/GT 20   Total Intake(mL/kg) 1212.8 (11.7)   Urine (mL/kg/hr) 1320 (0.5)   Total Output 1320   Net -107.2        PHYSICAL EXAMINATION: General:  NAD. Intubated Neuro:  Wakes up and follows commands HEENT:  R  IJ Cardiovascular:  RRR. No M/G/R Lungs: Right Lung rales noted. Scattered exp. Wheezing noted.    Abdomen:  Obese, soft, BS x 4 Musculoskeletal:  Trace edema  LABS:  CBC  Recent Labs Lab 12/23/13 2145 12/24/13 0325 12/25/13 0320  WBC 19.0* 18.2* 15.8*  HGB 11.7* 10.9* 10.7*  HCT 40.2 38.7 38.1  PLT 221 184 182   Coag's No results found for this basename: APTT, INR,  in the last 168 hours  BMET  Recent Labs Lab 12/23/13 2145 12/24/13 0325 12/25/13 0320  NA 145 144 148*  K 4.1 4.8 4.1  CL 100 101 104  CO2 32 31 28  BUN 65* 63* 65*  CREATININE 1.50* 1.37* 1.14*  GLUCOSE 126* 99 102*   Electrolytes  Recent Labs Lab 12/23/13 2145 12/24/13 0325 12/25/13 0320  CALCIUM 8.5 8.3* 8.6   Sepsis Markers  Recent Labs Lab 12/23/13 2302 12/24/13 0325 12/24/13 1923  LATICACIDVEN 0.86  --   --   PROCALCITON  --  0.32 0.33   ABG  Recent Labs Lab 12/24/13 1633 12/24/13 1829 12/25/13 0825  PHART 7.243* 7.278* 7.395  PCO2ART 89.1* 74.4* 58.0*  PO2ART 335.0* 70.0* 55.0*   Liver Enzymes  Recent Labs Lab 12/23/13 2145  AST 16  ALT 59*  ALKPHOS 98  BILITOT 0.7  ALBUMIN 3.1*   Cardiac Enzymes  Recent Labs Lab 12/23/13 2145 12/24/13 0325 12/24/13 0910 12/24/13 1358  TROPONINI <  0.30 <0.30 <0.30 <0.30  PROBNP 10387.0*  --   --   --    Glucose  Recent Labs Lab 12/24/13 0750 12/24/13 1201 12/24/13 1720 12/24/13 1918 12/24/13 2340 12/25/13 0406  GLUCAP 103* 139* 158* 183* 137* 89   IMAGING:  Dg Chest 2 View  12/23/2013   CLINICAL DATA:  Low oxygen saturation, shortness of breath for 10 days, productive cough  EXAM: CHEST  2 VIEW  COMPARISON:  DG CHEST 2 VIEW dated 01/12/2011  FINDINGS: Mild cardiac enlargement stable. Mild vascular congestion. No evidence of pulmonary edema. There is opacity medially in the right middle lobe, new from prior study.  IMPRESSION: Right middle lobe pneumonia.   Electronically Signed   By: Skipper Cliche M.D.   On:  12/23/2013 21:58   Dg Chest Port 1 View  12/24/2013   CLINICAL DATA:  Community acquired pneumonia. Acute on chronic respiratory failure. Intubation.  EXAM: PORTABLE CHEST - 1 VIEW  COMPARISON:  12/23/2013  FINDINGS: Endotracheal tube is seen in place with tip approximately 7 cm above the chronic. A new right jugular central venous catheter is seen with tip in the SVC. No pneumothorax identified.  Lung bases are not visualized on today's exam however visualized portions of lung fields are clear.  IMPRESSION: Somewhat high endotracheal tube position, with tip approximately 7 cm above the carina.  New right internal jugular central venous catheter in appropriate position. No evidence of pneumothorax.   Electronically Signed   By: Earle Gell M.D.   On: 12/24/2013 16:03   Dg Abd Portable 1v  12/24/2013   CLINICAL DATA:  Nasogastric tube placement  EXAM: PORTABLE ABDOMEN - 1 VIEW  COMPARISON:  Portable exam 1728 hr without priors available for comparison  FINDINGS: Nasogastric tube coiled in proximal stomach.  Air-filled nondistended loops of large and small bowel throughout abdomen.  Small amount a gas within rectum.  No bowel wall thickening or dilatation.  Bones appear demineralized.  IMPRESSION: Nasogastric tube coiled in proximal stomach.   Electronically Signed   By: Lavonia Dana M.D.   On: 12/24/2013 18:24   ASSESSMENT / PLAN:  A:  Acute on chronic respiratory failure  CAP-Strep pneumo positive L hemidiaphragm elevation, paralysis?  Probable COPD with exacerbation  Tobacco use disorder  P:  Goal pH>7.28 and SpO2>92  Continuous vent support VAP bundle ABG this am  Duoneb scehduled and Albuterol PRN  Solu-Medrol 80 q8h  Sniff test when stable   CARDIOVASCULAR  A:  Acute on chronic diastolic congestive heart failure P:  Goal MAP>65 Levophed gtt  RENAL  A:  Acute on chronic renal failure, improving Mild hypernatremia Clinically euvolemic  P:  Trend BMP Defer further diuresis    GASTROINTESTINAL  A:  Mildly elevated ALT  History of gastric bypass  Nutrition  GIPx  P:  NPO  Protonix Start TF  HEMATOLOGIC  A:  Normocytic Anemia  History of breast cancer  VTE Px  P:  Trend CBC  Heparin SQ  INFECTIOUS  A:  Pneumococcal pneumonia  P:  Abx / cx as above   ENDOCRINE  A:  Prediabetes, HGB A1C 6.1 P:  SSI   NEUROLOGIC  A:  Acute encephalopathy Of note family reports patient having several empty bottles of prescription opioids at her residence  P:  Drug screen  Fentanyl gtt  Charlann Lange, MD PGY-3 IMTS  12/25/2013, 6:45 AM  I have personally obtained history, examined patient, evaluated and interpreted laboratory and imaging results, reviewed medical records,  formulated assessment / plan and placed orders.  CRITICAL CARE:  The patient is critically ill with multiple organ systems failure and requires high complexity decision making for assessment and support, frequent evaluation and titration of therapies, application of advanced monitoring technologies and extensive interpretation of multiple databases. Critical Care Time devoted to patient care services described in this note is 35 minutes.   Doree Fudge, MD Pulmonary and [place] Pager: [phone]  12/25/2013, 9:53 AM

## 2013-12-26 ENCOUNTER — Inpatient Hospital Stay (HOSPITAL_COMMUNITY): Payer: Medicare Other

## 2013-12-26 DIAGNOSIS — F17200 Nicotine dependence, unspecified, uncomplicated: Secondary | ICD-10-CM

## 2013-12-26 DIAGNOSIS — B070 Plantar wart: Secondary | ICD-10-CM

## 2013-12-26 LAB — GLUCOSE, CAPILLARY
Glucose-Capillary: 167 mg/dL — ABNORMAL HIGH (ref 70–99)
Glucose-Capillary: 178 mg/dL — ABNORMAL HIGH (ref 70–99)
Glucose-Capillary: 167 mg/dL — ABNORMAL HIGH (ref 70–99)
Glucose-Capillary: 125 mg/dL — ABNORMAL HIGH (ref 70–99)
Glucose-Capillary: 120 mg/dL — ABNORMAL HIGH (ref 70–99)

## 2013-12-26 LAB — BASIC METABOLIC PANEL
BUN: 76 mg/dL — ABNORMAL HIGH (ref 6–23)
CO2: 31 mEq/L (ref 19–32)
Calcium: 8.6 mg/dL (ref 8.4–10.5)
Chloride: 105 mEq/L (ref 96–112)
Creatinine, Ser: 0.83 mg/dL (ref 0.50–1.10)
GFR calc Af Amer: 84 mL/min — ABNORMAL LOW (ref >90)
GFR calc non Af Amer: 72 mL/min — ABNORMAL LOW (ref >90)
Glucose, Bld: 196 mg/dL — ABNORMAL HIGH (ref 70–99)
Potassium: 3.5 mEq/L — ABNORMAL LOW (ref 3.7–5.3)
Sodium: 150 mEq/L — ABNORMAL HIGH (ref 137–147)

## 2013-12-26 LAB — HEPATIC FUNCTION PANEL
ALT: 25 U/L (ref 0–35)
AST: 10 U/L (ref 0–37)
Albumin: 2.6 g/dL — ABNORMAL LOW (ref 3.5–5.2)
Alkaline Phosphatase: 74 U/L (ref 39–117)
Bilirubin, Direct: <0.2 mg/dL (ref 0.0–0.3)
Total Bilirubin: <0.2 mg/dL — ABNORMAL LOW (ref 0.3–1.2)
Total Protein: 5.9 g/dL — ABNORMAL LOW (ref 6.0–8.3)

## 2013-12-26 LAB — POCT I-STAT 3, ART BLOOD GAS (G3+)
Acid-Base Excess: 10 mmol/L — ABNORMAL HIGH (ref 0.0–2.0)
Bicarbonate: 36.8 mEq/L — ABNORMAL HIGH (ref 20.0–24.0)
O2 Saturation: 94 %
Patient temperature: 98.1
TCO2: 39 mmol/L (ref 0–100)
pCO2 arterial: 61.7 mmHg (ref 35.0–45.0)
pH, Arterial: 7.382 (ref 7.350–7.450)
pO2, Arterial: 73 mmHg — ABNORMAL LOW (ref 80.0–100.0)

## 2013-12-26 LAB — MAGNESIUM: Magnesium: 2.4 mg/dL (ref 1.5–2.5)

## 2013-12-26 LAB — CBC
HCT: 34.7 % — ABNORMAL LOW (ref 36.0–46.0)
Hemoglobin: 9.7 g/dL — ABNORMAL LOW (ref 12.0–15.0)
MCH: 24.8 pg — ABNORMAL LOW (ref 26.0–34.0)
MCHC: 28 g/dL — ABNORMAL LOW (ref 30.0–36.0)
MCV: 88.7 fL (ref 78.0–100.0)
Platelets: 158 10*3/uL (ref 150–400)
RBC: 3.91 MIL/uL (ref 3.87–5.11)
RDW: 17.9 % — ABNORMAL HIGH (ref 11.5–15.5)
WBC: 14.5 10*3/uL — ABNORMAL HIGH (ref 4.0–10.5)

## 2013-12-26 LAB — PHOSPHORUS: Phosphorus: 3.7 mg/dL (ref 2.3–4.6)

## 2013-12-26 MED ORDER — FUROSEMIDE 10 MG/ML IJ SOLN
40.0000 mg | Freq: Once | INTRAMUSCULAR | Status: AC
  Administered 2013-12-26: 40 mg via INTRAVENOUS
  Filled 2013-12-26: qty 4

## 2013-12-26 MED ORDER — POTASSIUM CHLORIDE 20 MEQ/15ML (10%) PO LIQD
40.0000 meq | Freq: Once | ORAL | Status: AC
  Administered 2013-12-26: 40 meq
  Filled 2013-12-26: qty 30

## 2013-12-26 MED ORDER — FUROSEMIDE 10 MG/ML IJ SOLN: 40.0000 mg | Freq: Once | INTRAMUSCULAR | Status: DC

## 2013-12-26 MED ORDER — FREE WATER
200.0000 mL | Freq: Four times a day (QID) | Status: DC
  Administered 2013-12-26 (×3): 200 mL

## 2013-12-26 MED ORDER — HALOPERIDOL LACTATE 5 MG/ML IJ SOLN: 5.0000 mg | Freq: Once | INTRAMUSCULAR | Status: DC

## 2013-12-26 MED ORDER — POTASSIUM CHLORIDE CRYS ER 20 MEQ PO TBCR: 40.0000 meq | EXTENDED_RELEASE_TABLET | Freq: Once | ORAL | Status: DC

## 2013-12-26 MED ORDER — METHYLPREDNISOLONE SODIUM SUCC 40 MG IJ SOLR
40.0000 mg | Freq: Two times a day (BID) | INTRAMUSCULAR | Status: DC
  Administered 2013-12-26 – 2013-12-27 (×2): 40 mg via INTRAVENOUS
  Filled 2013-12-26 (×4): qty 1

## 2013-12-26 MED ORDER — NYSTATIN 100000 UNIT/GM EX POWD
Freq: Two times a day (BID) | CUTANEOUS | Status: DC
  Administered 2013-12-26 – 2013-12-30 (×9): via TOPICAL
  Filled 2013-12-26 (×2): qty 15

## 2013-12-26 NOTE — Progress Notes (Signed)
PULMONARY / CRITICAL CARE MEDICINE   Name: [name] MRN: [id number] DOB: 1951/05/29    ADMISSION DATE:  12/23/2013 CONSULTATION DATE:  12/26/13  REFERRING MD : FMTS  PRIMARY SERVICE: PCCM   BRIEF PATIENT DESCRIPTION: 63-yo-female with poor medical follow-up with 2-week history of worsening dyspnea, cough, and lethargy found to have right middle lobe pneumonia with acute hypercarbic respiratory failure requiring BiPAP support. PCCM consulted due to continued hypercarbia on ABG following BiPAP   SIGNIFICANT EVENTS / STUDIES:  4/13 TTE >>> EF 96-78%, grade 1 diastolic dysfunction, severe dilated right atrium   LINES / TUBES:  OETT 4/13 >>>  OGT 4/13 >>>  R IJ TLC 4/13 >>>  R R AL 4/13 >>> 4/14 Foley 4/13 >>>   CULTURES:  4/13 Respiratory >>>  4/13 Blood  4/13 Urine Legionella >>> neg  4/13 Flu PCR >>> neg  4/13 Urine strep Ag >>> POSITIVE   ANTIBIOTICS:  4/13 Ceftriaxone>>>  4/13 Azithromycin >>> 4/15  SUBJECTIVE Awake and follows commands. Off pressor since yesterday. CVP 10.   no acute events overnight.  REVIEW OF SYSTEMS:  Unable to obtain. Patient is intubated  VITAL SIGNS: Temp:  [97.4 F (36.3 C)-98.8 F (37.1 C)] 97.6 F (36.4 C) (04/15 0356) Pulse Rate:  [69-102] 83 (04/15 0500) Resp:  [13-25] 14 (04/15 0500) BP: (94-144)/(42-118) 107/93 mmHg (04/15 0500) SpO2:  [84 %-99 %] 99 % (04/15 0500) FiO2 (%):  [40 %-60 %] 60 % (04/15 0400) Weight:  [229 lb 15 oz (104.3 kg)] 229 lb 15 oz (104.3 kg) (04/15 0452) HEMODYNAMICS: CVP:  [10 mmHg] 10 mmHg VENTILATOR SETTINGS: Vent Mode:  [-] PRVC FiO2 (%):  [40 %-60 %] 60 % Set Rate:  [16 bmp] 16 bmp Vt Set:  [500 mL] 500 mL PEEP:  [5 cmH20] 5 cmH20 Pressure Support:  [5 cmH20] 5 cmH20 Plateau Pressure:  [7 cmH20-13 cmH20] 13 cmH20 INTAKE / OUTPUT: Intake/Output     04/14 0701 - 04/15 0700   I.V. (mL/kg) 497.4 (4.8)   NG/GT 410   IV Piggyback 100   Total Intake(mL/kg) 1007.4 (9.7)   Urine (mL/kg/hr) 810  (0.3)   Total Output 810   Net +197.4         PHYSICAL EXAMINATION: General: NAD. Intubated  Neuro: Wakes up and follows commands  HEENT: R IJ  Cardiovascular: RRR. No M/G/R  Lungs: Right Lung rales noted. Scattered exp. Wheezing noted.  Abdomen: Obese, soft, BS x 4  Musculoskeletal: Trace edema   LABS:  CBC  Recent Labs Lab 12/24/13 0325 12/25/13 0320 12/26/13 0415  WBC 18.2* 15.8* 14.5*  HGB 10.9* 10.7* 9.7*  HCT 38.7 38.1 34.7*  PLT 184 182 158   Coag's No results found for this basename: APTT, INR,  in the last 168 hours BMET  Recent Labs Lab 12/24/13 0325 12/25/13 0320 12/26/13 0415  NA 144 148* 150*  K 4.8 4.1 3.5*  CL 101 104 105  CO2 31 28 31   BUN 63* 65* 76*  CREATININE 1.37* 1.14* 0.83  GLUCOSE 99 102* 196*   Electrolytes  Recent Labs Lab 12/24/13 0325 12/25/13 0320 12/26/13 0415  CALCIUM 8.3* 8.6 8.6  MG  --  2.3  --   PHOS  --  4.5  --    Sepsis Markers  Recent Labs Lab 12/23/13 2302 12/24/13 0325 12/24/13 1923  LATICACIDVEN 0.86  --   --   PROCALCITON  --  0.32 0.33   ABG  Recent Labs Lab 12/24/13 1829 12/25/13 0825  12/26/13 0416  PHART 7.278* 7.395 7.382  PCO2ART 74.4* 58.0* 61.7*  PO2ART 70.0* 55.0* 73.0*   Liver Enzymes  Recent Labs Lab 12/23/13 2145  AST 16  ALT 59*  ALKPHOS 98  BILITOT 0.7  ALBUMIN 3.1*   Cardiac Enzymes  Recent Labs Lab 12/23/13 2145 12/24/13 0325 12/24/13 0910 12/24/13 1358  TROPONINI <0.30 <0.30 <0.30 <0.30  PROBNP 10387.0*  --   --   --    Glucose  Recent Labs Lab 12/25/13 0749 12/25/13 1155 12/25/13 1536 12/25/13 1921 12/25/13 2331 12/26/13 0341  GLUCAP 113* 124* 140* 146* 170* 167*   Imaging Portable Chest Xray In Am  12/25/2013   CLINICAL DATA:  Followup pneumonia.  EXAM: PORTABLE CHEST - 1 VIEW  COMPARISON:  12/24/2013  FINDINGS: Support devices are unchanged. Mild elevation of the left hemidiaphragm. Mild cardiomegaly with vascular congestion and bibasilar  atelectasis. No visible effusions or pneumothorax. No acute bony abnormality.  IMPRESSION: Mild elevation of the left hemidiaphragm.  Bibasilar atelectasis, vascular congestion.   Electronically Signed   By: Rolm Baptise M.D.   On: 12/25/2013 07:38   Dg Chest Port 1 View  12/24/2013   CLINICAL DATA:  Community acquired pneumonia. Acute on chronic respiratory failure. Intubation.  EXAM: PORTABLE CHEST - 1 VIEW  COMPARISON:  12/23/2013  FINDINGS: Endotracheal tube is seen in place with tip approximately 7 cm above the chronic. A new right jugular central venous catheter is seen with tip in the SVC. No pneumothorax identified.  Lung bases are not visualized on today's exam however visualized portions of lung fields are clear.  IMPRESSION: Somewhat high endotracheal tube position, with tip approximately 7 cm above the carina.  New right internal jugular central venous catheter in appropriate position. No evidence of pneumothorax.   Electronically Signed   By: Earle Gell M.D.   On: 12/24/2013 16:03   Dg Abd Portable 1v  12/24/2013   CLINICAL DATA:  Nasogastric tube placement  EXAM: PORTABLE ABDOMEN - 1 VIEW  COMPARISON:  Portable exam 1728 hr without priors available for comparison  FINDINGS: Nasogastric tube coiled in proximal stomach.  Air-filled nondistended loops of large and small bowel throughout abdomen.  Small amount a gas within rectum.  No bowel wall thickening or dilatation.  Bones appear demineralized.  IMPRESSION: Nasogastric tube coiled in proximal stomach.   Electronically Signed   By: Lavonia Dana M.D.   On: 12/24/2013 18:24   ASSESSMENT / PLAN:  A:  Acute on chronic respiratory failure , suspect chronic hypoxemia CAP-Strep pneumo positive  L hemidiaphragm elevation, paralysis?  Probable COPD with exacerbation Tobacco use disorder  P:  Extubate May need to bridge with BiPAP Duoneb scehduled and Albuterol PRN  Solu-Medrol 80 q8h --> 40 q12h Sniff test when stable   CARDIOVASCULAR   A:  Acute on chronic diastolic congestive heart failure  P:  Goal MAP>65  D/c Levophed gtt   RENAL  A:  Acute on chronic renal failure, improving  Hypernatremia  Hypokalemia Clinically euvolemic  P:  Check Mg and Phos Trend BMP  Lasix 40 x 1 before extubation K 40   GASTROINTESTINAL  A:  Mildly elevated ALT  History of gastric bypass  Nutrition  GIPx  P:  NPO  Protonix, will d/c after extubated Hold TF as expecting to extubate  HEMATOLOGIC  A:  Normocytic Anemia  History of breast cancer  VTE Px  P:  Trend CBC  Heparin SQ   INFECTIOUS  A:  Pneumococcal pneumonia, afebrile,  WBC trending down   P:  Abx / cx as above  D/c Azithromycin   ENDOCRINE  A:  Prediabetes, HGB A1C 6.1  P:  SSI   NEUROLOGIC  A:  Acute encephalopathy, improving Of note family reports patient having several empty bottles of prescription opioids at her residence  P: Fentanyl gtt, hold  Charlann Lange, MD PGY-3  IMTS  I have personally obtained history, examined patient, evaluated and interpreted laboratory and imaging results, reviewed medical records, formulated assessment / plan and placed orders.  CRITICAL CARE:  The patient is critically ill with multiple organ systems failure and requires high complexity decision making for assessment and support, frequent evaluation and titration of therapies, application of advanced monitoring technologies and extensive interpretation of multiple tabases. Critical Care Time devoted to patient care services described in this note is 35 minutes.   Doree Fudge, MD Pulmonary and [place] Pager: [phone]  12/26/2013, 9:30 AM

## 2013-12-26 NOTE — Progress Notes (Signed)
Interim Note:   Continuing to follow along with Ms Limbach's care as Dr Lindell Noe with [facility] is her PCP. FMTS will be happy to resume care when appropriate for transferring out of ICU. We appreciate the efforts of the entire nursing and medical team involved in her care.   Olam Idler, MD, PGY-1  [facility] Pager: [phone]

## 2013-12-26 NOTE — Procedures (Signed)
Extubation Procedure Note  Patient Details:   Name: Ariza Evans DOB: 01/08/1951 MRN: [id number]   Airway Documentation:   Pt extubated to 6L [place]. Sats 86-91. MD notified. Pt able to cough and vocalize. No stridor noted. BBS dim and equal. RT will continue to monitor.   Evaluation  O2 sats: transiently fell during during procedure and currently acceptable Complications: No apparent complications Patient did tolerate procedure well. Bilateral Breath Sounds: Diminished;Clear Suctioning: Airway   Lissa Merlin 12/26/2013, 10:04 AM

## 2013-12-26 NOTE — Progress Notes (Signed)
PT Cancellation Note  Patient Details Name: [name] MRN: [id number] DOB: 1950/10/12   Cancelled Treatment:    Reason Eval/Treat Not Completed: Patient not medically ready.  Pt with attempts to wean off vent after extubation with difficulties maintaining sats >80%.  Will defer treatment today and check back tomorrow.  If pt still with difficulty tomorrow will sign off and ask for reorder.  Thanks.   Makani Seckman [place] 12/26/2013, 11:19 AM  [facility] [phone] (pager)

## 2013-12-27 LAB — GLUCOSE, CAPILLARY
Glucose-Capillary: 134 mg/dL — ABNORMAL HIGH (ref 70–99)
Glucose-Capillary: 120 mg/dL — ABNORMAL HIGH (ref 70–99)
Glucose-Capillary: 119 mg/dL — ABNORMAL HIGH (ref 70–99)
Glucose-Capillary: 144 mg/dL — ABNORMAL HIGH (ref 70–99)
Glucose-Capillary: 164 mg/dL — ABNORMAL HIGH (ref 70–99)

## 2013-12-27 LAB — BASIC METABOLIC PANEL
BUN: 58 mg/dL — ABNORMAL HIGH (ref 6–23)
CO2: 34 mEq/L — ABNORMAL HIGH (ref 19–32)
Calcium: 8.7 mg/dL (ref 8.4–10.5)
Chloride: 108 mEq/L (ref 96–112)
Creatinine, Ser: 0.76 mg/dL (ref 0.50–1.10)
GFR calc Af Amer: >90 mL/min (ref >90)
GFR calc non Af Amer: 87 mL/min — ABNORMAL LOW (ref >90)
Glucose, Bld: 123 mg/dL — ABNORMAL HIGH (ref 70–99)
Potassium: 4.6 mEq/L (ref 3.7–5.3)
Sodium: 152 mEq/L — ABNORMAL HIGH (ref 137–147)

## 2013-12-27 LAB — CBC
HCT: 35.7 % — ABNORMAL LOW (ref 36.0–46.0)
Hemoglobin: 9.8 g/dL — ABNORMAL LOW (ref 12.0–15.0)
MCH: 24 pg — ABNORMAL LOW (ref 26.0–34.0)
MCHC: 27.5 g/dL — ABNORMAL LOW (ref 30.0–36.0)
MCV: 87.3 fL (ref 78.0–100.0)
Platelets: 164 10*3/uL (ref 150–400)
RBC: 4.09 MIL/uL (ref 3.87–5.11)
RDW: 17.8 % — ABNORMAL HIGH (ref 11.5–15.5)
WBC: 14.3 10*3/uL — ABNORMAL HIGH (ref 4.0–10.5)

## 2013-12-27 LAB — CULTURE, RESPIRATORY W GRAM STAIN

## 2013-12-27 LAB — CULTURE, RESPIRATORY

## 2013-12-27 MED ORDER — PREDNISONE 50 MG PO TABS
60.0000 mg | ORAL_TABLET | Freq: Every day | ORAL | Status: DC
  Administered 2013-12-27 – 2013-12-29 (×3): 60 mg via ORAL
  Filled 2013-12-27 (×5): qty 1

## 2013-12-27 MED ORDER — TIOTROPIUM BROMIDE MONOHYDRATE 18 MCG IN CAPS
18.0000 ug | ORAL_CAPSULE | Freq: Every day | RESPIRATORY_TRACT | Status: DC
  Administered 2013-12-27 – 2013-12-30 (×4): 18 ug via RESPIRATORY_TRACT
  Filled 2013-12-27 (×2): qty 5

## 2013-12-27 MED ORDER — ENSURE COMPLETE PO LIQD: 237.0000 mL | Freq: Two times a day (BID) | ORAL | Status: DC | PRN

## 2013-12-27 MED ORDER — INSULIN ASPART 100 UNIT/ML ~~LOC~~ SOLN
0.0000 [IU] | Freq: Three times a day (TID) | SUBCUTANEOUS | Status: DC
  Administered 2013-12-27: 2 [IU] via SUBCUTANEOUS
  Administered 2013-12-27: 3 [IU] via SUBCUTANEOUS

## 2013-12-27 MED ORDER — ALBUTEROL SULFATE (2.5 MG/3ML) 0.083% IN NEBU
2.5000 mg | INHALATION_SOLUTION | RESPIRATORY_TRACT | Status: DC
  Administered 2013-12-27 (×5): 2.5 mg via RESPIRATORY_TRACT
  Filled 2013-12-27 (×5): qty 3

## 2013-12-27 MED ORDER — MOMETASONE FURO-FORMOTEROL FUM 200-5 MCG/ACT IN AERO
2.0000 | INHALATION_SPRAY | Freq: Two times a day (BID) | RESPIRATORY_TRACT | Status: DC
  Administered 2013-12-27 – 2013-12-29 (×4): 2 via RESPIRATORY_TRACT
  Filled 2013-12-27: qty 8.8

## 2013-12-27 NOTE — Progress Notes (Addendum)
Interim Note:   Continuing to follow along with Ms Mally's care as Dr Lindell Noe with [facility] is her PCP. FMTS will be happy to resume care when appropriate for transferring out of ICU. We appreciate the efforts of the entire nursing and medical team involved in her care.   4/13: Resp distress; BiPAP; TTE >>> EF 82-42%, grade 1 diastolic dysfcn, severe dilated right atrium  4/14: Strep Pneumo +; Intubated 4/15 Extubated and tolerated well 4/16: Transfer to SDU 4/16. Keep on PCCM service.  ANTIBIOTICS:  4/13 Ceftriaxone>>>  4/13 Azithromycin >>> 4/15   Olam Idler, MD, PGY-1  [facility] Pager: [phone]  FMTS Attending Note Patient known to me, had followed as my patient in [facility] with most recent visit being nearly two years ago.  I saw and examined patient today, she is alert and sitting up in bed, nasal canula oxygen.  Discussed patient's hospital course to-date and plan for outpatient hospital follow up in the office.  Will need to make outpatient follow up with Pulmonology as well; patient has a long tobacco history and is vehemently opposed to any discussion of diagnosis of COPD.  Plan for PFTs as part of her outpatient evaluation.  Dalbert Mayotte, MD

## 2013-12-27 NOTE — Evaluation (Signed)
Physical Therapy Evaluation Patient Details Name: [name] MRN: [id number] DOB: 1950/11/10 Today's Date: 12/27/2013   History of Present Illness  Pt adm with right middle lobe pneumonia with acute hypercarbic respiratory failure. Pt intubated 4/13-4/15.  Clinical Impression  Pt admitted with above. Pt currently with functional limitations due to the deficits listed below (see PT Problem List).  Pt will benefit from skilled PT to increase their independence and safety with mobility to allow discharge to her home.      Follow Up Recommendations No PT follow up;Supervision - Intermittent    Equipment Recommendations  Other (comment) (to be determined)    Recommendations for Other Services       Precautions / Restrictions Precautions Precautions: Fall      Mobility  Bed Mobility                  Transfers Overall transfer level: Needs assistance Equipment used: Rolling walker (2 wheeled) Transfers: Sit to/from Stand Sit to Stand: Min guard         General transfer comment: Assist for lines and safety  Ambulation/Gait Ambulation/Gait assistance: Min guard Ambulation Distance (Feet): 150 Feet Assistive device: Rolling walker (2 wheeled) Gait Pattern/deviations: Step-through pattern;Decreased stride length Gait velocity: decr Gait velocity interpretation: Below normal speed for age/gender General Gait Details: appropriate use of rolling walker to provide steadying assist.  Stairs            Wheelchair Mobility    Modified Rankin (Stroke Patients Only)       Balance Overall balance assessment: Needs assistance         Standing balance support: No upper extremity supported Standing balance-Leahy Scale: Fair                               Pertinent Vitals/Pain See flow sheet.     Home Living Family/patient expects to be discharged to:: Private residence Living Arrangements: Parent;Other relatives (elderly mother and younger  brother) Available Help at Discharge: Family;Available PRN/intermittently Type of Home: House Home Access: Level entry     Home Layout: One level Home Equipment: None Additional Comments: Pt may be able to borrow mother's rolling walker.    Prior Function Level of Independence: Independent               Hand Dominance        Extremity/Trunk Assessment   Upper Extremity Assessment: Overall WFL for tasks assessed           Lower Extremity Assessment: Generalized weakness         Communication   Communication: No difficulties  Cognition Arousal/Alertness: Awake/alert Behavior During Therapy: WFL for tasks assessed/performed Overall Cognitive Status: Within Functional Limits for tasks assessed                      General Comments      Exercises        Assessment/Plan    PT Assessment Patient needs continued PT services  PT Diagnosis Generalized weakness;Difficulty walking   PT Problem List Decreased strength;Decreased activity tolerance;Decreased balance;Decreased mobility  PT Treatment Interventions DME instruction;Gait training;Functional mobility training;Therapeutic activities;Therapeutic exercise;Balance training;Patient/family education   PT Goals (Current goals can be found in the Care Plan section) Acute Rehab PT Goals Patient Stated Goal: Return home PT Goal Formulation: With patient Time For Goal Achievement: 01/03/14 Potential to Achieve Goals: Good    Frequency Min 3X/week   Barriers  to discharge        Co-evaluation               End of Session Equipment Utilized During Treatment: Oxygen Activity Tolerance: Patient tolerated treatment well Patient left: in chair;with call bell/phone within reach Nurse Communication: Mobility status         Time: 4818-5631 PT Time Calculation (min): 18 min   Charges:   PT Evaluation $Initial PT Evaluation Tier I: 1 Procedure PT Treatments $Gait Training: 8-22 mins   PT  G CodesShary Decamp Detric Scalisi 12/27/2013, 11:20 AM  Suanne Marker PT [phone]

## 2013-12-27 NOTE — Progress Notes (Signed)
NUTRITION FOLLOW UP  Intervention:    Continue regular diet.  Offer Ensure Complete PO BID prn if intake is < 50% of meals, each supplement provides 350 kcal and 13 grams of protein  Nutrition Dx:   Inadequate oral intake now related to recent intubation as evidenced by diet just advanced to regular diet.  Goal:   Intake to meet >90% of estimated nutrition needs.  Monitor:   PO intake, labs, weight trend.  Assessment:   63-yo-female with poor medical follow-up with 2-week history of worsening dyspnea, cough, and lethargy found to have right middle lobe pneumonia with acute hypercarbic respiratory failure requiring BiPAP support. PCCM consulted due to continued hypercarbia on ABG following BiPAP.   Patient was extubated on 4/15. Diet was advanced from full liquids to regular diet this morning. Patient tolerating well.  Height: Ht Readings from Last 1 Encounters:  12/24/13 5\' 5"  (1.651 m)    Weight Status:   Wt Readings from Last 1 Encounters:  12/27/13 228 lb 9.9 oz (103.7 kg)    Re-estimated needs:  Kcal: 1700-1900 Protein: 90-110 gm Fluid: 2 L  Skin: no wounds  Diet Order: General   Intake/Output Summary (Last 24 hours) at 12/27/13 1150 Last data filed at 12/27/13 0800  Gross per 24 hour  Intake    480 ml  Output   2165 ml  Net  -1685 ml    Last BM: None documented since admission   Labs:   Recent Labs Lab 12/24/13 0325 12/25/13 0320 12/26/13 0415 12/27/13 0449  NA 144 148* 150* 152*  K 4.8 4.1 3.5* 4.6  CL 101 104 105 108  CO2 31 28 31  34*  BUN 63* 65* 76* 58*  CREATININE 1.37* 1.14* 0.83 0.76  CALCIUM 8.3* 8.6 8.6 8.7  MG  --  2.3 2.4  --   PHOS  --  4.5 3.7  --   GLUCOSE 99 102* 196* 123*    CBG (last 3)   Recent Labs  12/26/13 2322 12/27/13 0406 12/27/13 0821  GLUCAP 134* 120* 119*    Scheduled Meds: . albuterol  2.5 mg Nebulization Q4H  . cefTRIAXone (ROCEPHIN)  IV  1 g Intravenous Q24H  . heparin  5,000 Units Subcutaneous 3  times per day  . insulin aspart  0-15 Units Subcutaneous TID WC & HS  . mometasone-formoterol  2 puff Inhalation BID  . naLOXone (NARCAN)  injection  0.4 mg Intravenous Once  . nystatin   Topical BID  . predniSONE  60 mg Oral Q breakfast  . tiotropium  18 mcg Inhalation Daily    Continuous Infusions: . sodium chloride Stopped (12/27/13 0800)    Molli Barrows, RD, LDN, [place] Pager [phone] After Hours Pager [phone]

## 2013-12-27 NOTE — Progress Notes (Signed)
PULMONARY / CRITICAL CARE MEDICINE   Name: [name] MRN: [id number] DOB: [birth year]    ADMISSION DATE:  12/23/2013 CONSULTATION DATE:  12/27/13  ADMISSION DATE: 12/23/2013  CONSULTATION DATE: 12/26/13   REFERRING MD : FMTS  PRIMARY SERVICE: PCCM   BRIEF PATIENT DESCRIPTION: 63-yo-female with poor medical follow-up with 2-week history of worsening dyspnea, cough, and lethargy found to have right middle lobe pneumonia with acute hypercarbic respiratory failure requiring BiPAP support. PCCM consulted due to continued hypercarbia on ABG following BiPAP   SIGNIFICANT EVENTS / STUDIES:  4/13 TTE >>> EF 40-98%, grade 1 diastolic dysfunction, severe dilated right atrium  4/15 Extubated and tolerated well  LINES / TUBES:  OETT 4/13 >>>4/15  OGT 4/13 >>> 4/15 R IJ TLC 4/13 >>>  R R AL 4/13 >>> 4/14  Foley 4/13 >>> 4/15   CULTURES:  4/13 MRSa PCR >>> neg 4/13 Respiratory >>>  4/13 Blood>>> 4/13 Urine Legionella >>> neg  4/13 Flu PCR >>> neg  4/13 Urine strep Ag >>> POSITIVE   ANTIBIOTICS:  4/13 Ceftriaxone>>>  4/13 Azithromycin >>> 4/15   SUBJECTIVE: Tolerated extubation well. No acute events overnight.  VITAL SIGNS: Temp:  [97.3 F (36.3 C)-98.6 F (37 C)] 98.1 F (36.7 C) (04/16 0406) Pulse Rate:  [69-103] 79 (04/16 0300) Resp:  [11-23] 19 (04/16 0300) BP: (108-141)/(44-91) 131/70 mmHg (04/16 0300) SpO2:  [85 %-100 %] 89 % (04/16 0300) FiO2 (%):  [50 %] 50 % (04/15 1030) Weight:  [228 lb 9.9 oz (103.7 kg)] 228 lb 9.9 oz (103.7 kg) (04/16 0500)  HEMODYNAMICS: CVP:  [10 mmHg-11 mmHg] 10 mmHg  VENTILATOR SETTINGS: Vent Mode:  [-] Stand-by FiO2 (%):  [50 %] 50 % Set Rate:  [16 bmp] 16 bmp Vt Set:  [500 mL] 500 mL PEEP:  [5 cmH20] 5 cmH20 Plateau Pressure:  [18 cmH20] 18 cmH20  INTAKE / OUTPUT: Intake/Output     04/15 0701 - 04/16 0700   P.O. 220   I.V. (mL/kg) 200 (1.9)   NG/GT 35   IV Piggyback 50   Total Intake(mL/kg) 505 (4.9)   Urine (mL/kg/hr) 1895  (0.8)   Total Output 1895   Net -1390         PHYSICAL EXAMINATION: General: NAD.  Neuro: A+O x3  HEENT: R IJ  Cardiovascular: RRR. No M/G/R  Lungs: Right Lung rales noted. Scattered exp. Wheezing noted.  Abdomen: Obese, soft, BS x 4  Musculoskeletal: Trace edema  LABS:  CBC  Recent Labs Lab 12/25/13 0320 12/26/13 0415 12/27/13 0449  WBC 15.8* 14.5* 14.3*  HGB 10.7* 9.7* 9.8*  HCT 38.1 34.7* 35.7*  PLT 182 158 164   Coag's No results found for this basename: APTT, INR,  in the last 168 hours BMET  Recent Labs Lab 12/25/13 0320 12/26/13 0415 12/27/13 0449  NA 148* 150* 152*  K 4.1 3.5* 4.6  CL 104 105 108  CO2 28 31 34*  BUN 65* 76* 58*  CREATININE 1.14* 0.83 0.76  GLUCOSE 102* 196* 123*   Electrolytes  Recent Labs Lab 12/25/13 0320 12/26/13 0415 12/27/13 0449  CALCIUM 8.6 8.6 8.7  MG 2.3 2.4  --   PHOS 4.5 3.7  --    Sepsis Markers  Recent Labs Lab 12/23/13 2302 12/24/13 0325 12/24/13 1923  LATICACIDVEN 0.86  --   --   PROCALCITON  --  0.32 0.33   ABG  Recent Labs Lab 12/24/13 1829 12/25/13 0825 12/26/13 0416  PHART 7.278* 7.395 7.382  PCO2ART 74.4*  58.0* 61.7*  PO2ART 70.0* 55.0* 73.0*   Liver Enzymes  Recent Labs Lab 12/23/13 2145 12/26/13 0416  AST 16 10  ALT 59* 25  ALKPHOS 98 74  BILITOT 0.7 <0.2*  ALBUMIN 3.1* 2.6*   Cardiac Enzymes  Recent Labs Lab 12/23/13 2145 12/24/13 0325 12/24/13 0910 12/24/13 1358  TROPONINI <0.30 <0.30 <0.30 <0.30  PROBNP 10387.0*  --   --   --    Glucose  Recent Labs Lab 12/26/13 0807 12/26/13 1125 12/26/13 1537 12/26/13 1908 12/26/13 2322 12/27/13 0406  GLUCAP 178* 167* 125* 120* 134* 120*   IMAGING:   Dg Chest Port 1 View  12/26/2013   CLINICAL DATA:  Respiratory failure; hypoxia  EXAM: PORTABLE CHEST - 1 VIEW  COMPARISON:  December 25, 2013  FINDINGS: Endotracheal tube tip is 5.2 cm above the carinal. Central catheter tip is in the superior vena cava near the cavoatrial  junction. Nasogastric tube tip and side port are below the diaphragm. No pneumothorax.  There is underlying emphysema. There is mild elevation of the left hemidiaphragm. There is bibasilar interstitial edema. There is no appreciable airspace consolidation.  Heart is mildly enlarged. The pulmonary vascularity is within normal limits. No adenopathy. There are surgical clips in the left axillary region.  IMPRESSION: Tube and catheter positions as described without pneumothorax. Evidence of a degree of congestive heart failure superimposed on emphysematous change. No airspace consolidation.   Electronically Signed   By: Lowella Grip M.D.   On: 12/26/2013 07:21   ASSESSMENT / PLAN:  A:  Acute on chronic respiratory failure , suspect chronic hypoxemia  CAP-Strep pneumo positive  L hemidiaphragm elevation, paralysis? ( curious if related to radiation to R chest for breast CA) Probable COPD with exacerbation  Tobacco use disorder  P:  Supplemental oxygen  Sat goal 88-92% Change duoneb to  Albuterol as added Spiriva D/c Solu-Medrol 40 q12h Start Prednisone 60 Sniff test when stable  Add Dulera and Spiriva Need PFT outpatient  CARDIOVASCULAR  A:  Chronic diastolic congestive heart failure, acute component resolved P:  Goal MAP>65  D/c CVL after PIVs placed  RENAL  A:  Acute on chronic renal failure, improving  Hypernatremia  P:  Trend BMP D/c Foley  GASTROINTESTINAL  A:  Mildly elevated ALT  History of gastric bypass  Nutrition  GIPx  P:  Advance diet as tolerated D/c TF  HEMATOLOGIC  A:  Normocytic Anemia  History of breast cancer  VTE Px  P:  Trend CBC  Heparin SQ   INFECTIOUS  A:  Pneumococcal pneumonia, afebrile, WBC trending down  P:  Abx / cx as above   ENDOCRINE  A:  Prediabetes, HGB A1C 6.1  P:  SSI   NEUROLOGIC  A:  Acute encephalopathy, resolved P:  No intervention required  Charlann Lange, MD PGY-3  IM[facility]  12/27/2013, 6:10 AM  Transfer to SDU  4/16.  Keep on PCCM service.  I have personally obtained history, examined patient, evaluated and interpreted laboratory and imaging results, reviewed medical records, formulated assessment / plan and placed orders.  Doree Fudge, MD Pulmonary and [place] Pager: [phone]  12/27/2013, 7:42 AM

## 2013-12-28 ENCOUNTER — Inpatient Hospital Stay (HOSPITAL_COMMUNITY): Payer: Medicare Other

## 2013-12-28 DIAGNOSIS — R0602 Shortness of breath: Secondary | ICD-10-CM

## 2013-12-28 DIAGNOSIS — R0902 Hypoxemia: Secondary | ICD-10-CM

## 2013-12-28 LAB — BASIC METABOLIC PANEL
BUN: 39 mg/dL — ABNORMAL HIGH (ref 6–23)
CO2: 33 mEq/L — ABNORMAL HIGH (ref 19–32)
Calcium: 8.7 mg/dL (ref 8.4–10.5)
Chloride: 106 mEq/L (ref 96–112)
Creatinine, Ser: 0.7 mg/dL (ref 0.50–1.10)
GFR calc Af Amer: >90 mL/min (ref >90)
GFR calc non Af Amer: 89 mL/min — ABNORMAL LOW (ref >90)
Glucose, Bld: 88 mg/dL (ref 70–99)
Potassium: 4.5 mEq/L (ref 3.7–5.3)
Sodium: 150 mEq/L — ABNORMAL HIGH (ref 137–147)

## 2013-12-28 LAB — CBC
HCT: 38.7 % (ref 36.0–46.0)
Hemoglobin: 10.7 g/dL — ABNORMAL LOW (ref 12.0–15.0)
MCH: 24.4 pg — ABNORMAL LOW (ref 26.0–34.0)
MCHC: 27.6 g/dL — ABNORMAL LOW (ref 30.0–36.0)
MCV: 88.4 fL (ref 78.0–100.0)
Platelets: 168 10*3/uL (ref 150–400)
RBC: 4.38 MIL/uL (ref 3.87–5.11)
RDW: 17.7 % — ABNORMAL HIGH (ref 11.5–15.5)
WBC: 10.9 10*3/uL — ABNORMAL HIGH (ref 4.0–10.5)

## 2013-12-28 LAB — GLUCOSE, CAPILLARY
Glucose-Capillary: 111 mg/dL — ABNORMAL HIGH (ref 70–99)
Glucose-Capillary: 84 mg/dL (ref 70–99)
Glucose-Capillary: 108 mg/dL — ABNORMAL HIGH (ref 70–99)
Glucose-Capillary: 192 mg/dL — ABNORMAL HIGH (ref 70–99)

## 2013-12-28 MED ORDER — ALBUTEROL SULFATE (2.5 MG/3ML) 0.083% IN NEBU: 2.5000 mg | INHALATION_SOLUTION | RESPIRATORY_TRACT | Status: DC | PRN

## 2013-12-28 MED ORDER — ALBUTEROL SULFATE (2.5 MG/3ML) 0.083% IN NEBU
2.5000 mg | INHALATION_SOLUTION | Freq: Three times a day (TID) | RESPIRATORY_TRACT | Status: DC
  Administered 2013-12-28 – 2013-12-29 (×4): 2.5 mg via RESPIRATORY_TRACT
  Filled 2013-12-28 (×5): qty 3

## 2013-12-28 NOTE — Progress Notes (Signed)
Physical Therapy Treatment Patient Details Name: [name] MRN: [id number] DOB: 1951-01-14 Today's Date: 01/15/2014    History of Present Illness Pt adm with right middle lobe pneumonia with acute hypercarbic respiratory failure. Pt intubated 4/13-4/15.PMHx- Lt TKR, pt reports needs Rt TKR    PT Comments    Pt with excellent progress re: breathing and tolerance for activity. She was limited today more by her Rt knee pain which is chronic.   Follow Up Recommendations  No PT follow up;Supervision - Intermittent     Equipment Recommendations  Rolling walker with 5" wheels    Recommendations for Other Services       Precautions / Restrictions Precautions Precautions: Fall    Mobility  Bed Mobility                  Transfers Overall transfer level: Needs assistance Equipment used: Rolling walker (2 wheeled) Transfers: Sit to/from Stand Sit to Stand: Min guard         General transfer comment: pt with proper sequencing; guarding assist for safety and lines'  Ambulation/Gait Ambulation/Gait assistance: Min assist Ambulation Distance (Feet): 200 Feet Assistive device: Rolling walker (2 wheeled) Gait Pattern/deviations: Step-through pattern;Decreased stride length;Decreased stance time - right;Decreased weight shift to right;Antalgic (incr lateral sway of torso) Gait velocity: decr Gait velocity interpretation: Below normal speed for age/gender General Gait Details: pt reported her Rt knee feels worse today (as occurs at times) with antalgic gait   Stairs            Wheelchair Mobility    Modified Rankin (Stroke Patients Only)       Balance                                    Cognition Arousal/Alertness: Awake/alert Behavior During Therapy: WFL for tasks assessed/performed Overall Cognitive Status: Within Functional Limits for tasks assessed                      Exercises      General Comments        Pertinent  Vitals/Pain SaO2 95% on 4L O2 at rest; decr to 88% with activity; incr to 97% after seated rest x 3 minutes     Home Living                      Prior Function            PT Goals (current goals can now be found in the care plan section) Progress towards PT goals: Progressing toward goals    Frequency  Min 3X/week    PT Plan Current plan remains appropriate    Co-evaluation             End of Session Equipment Utilized During Treatment: Gait belt;Oxygen Activity Tolerance: Patient tolerated treatment well Patient left: in chair;with call bell/phone within reach;with family/visitor present     Time: 1227-1258 PT Time Calculation (min): 31 min  Charges:  $Gait Training: 23-37 mins                    G Codes:      Jeanie Cooks Heyward Douthit Jan 15, 2014, 2:56 PM Pager [phone]

## 2013-12-28 NOTE — Progress Notes (Signed)
PULMONARY / CRITICAL CARE MEDICINE   Name: [name] MRN: [id number] DOB: [birth year]    ADMISSION DATE:  12/23/2013 CONSULTATION DATE:  12/27/13  ADMISSION DATE: 12/23/2013  CONSULTATION DATE: 12/26/13   REFERRING MD : FMTS  PRIMARY SERVICE: PCCM   BRIEF PATIENT DESCRIPTION: 63-yo-female with poor medical follow-up with 2-week history of worsening dyspnea, cough, and lethargy found to have right middle lobe pneumonia with acute hypercarbic respiratory failure requiring BiPAP support. PCCM consulted due to continued hypercarbia on ABG following BiPAP   SIGNIFICANT EVENTS / STUDIES:  4/13 TTE >>> EF 42-59%, grade 1 diastolic dysfunction, severe dilated right atrium  4/15 Extubated and tolerated well  LINES / TUBES:  OETT 4/13 >>>4/15  OGT 4/13 >>> 4/15 R IJ TLC 4/13 >>>  R R AL 4/13 >>> 4/14  Foley 4/13 >>> 4/15   CULTURES:  4/13 MRSa PCR >>> neg 4/13 Respiratory >>>  4/13 Blood>>> 4/13 Urine Legionella >>> neg  4/13 Flu PCR >>> neg  4/13 Urine strep Ag >>> POSITIVE   ANTIBIOTICS:  4/13 Ceftriaxone>>>  4/13 Azithromycin >>> 4/15   SUBJECTIVE: Breathing well, has been ambulating without difficulty  VITAL SIGNS: Temp:  [97.6 F (36.4 C)-98.8 F (37.1 C)] 98 F (36.7 C) (04/17 1100) Pulse Rate:  [69-87] 78 (04/17 1118) Resp:  [18-26] 18 (04/17 1118) BP: (117-149)/(53-90) 119/62 mmHg (04/17 1117) SpO2:  [89 %-98 %] 94 % (04/17 1118)  HEMODYNAMICS:    VENTILATOR SETTINGS:    INTAKE / OUTPUT: Intake/Output     04/16 0701 - 04/17 0700 04/17 0701 - 04/18 0700   P.O. 120 480   I.V. (mL/kg) 110 (1.1) 40 (0.4)   NG/GT     IV Piggyback 50    Total Intake(mL/kg) 280 (2.7) 520 (5)   Urine (mL/kg/hr) 945 (0.4) 400 (0.4)   Total Output 945 400   Net -665 +120        Urine Occurrence 1 x 1 x     PHYSICAL EXAMINATION: General: NAD HEENT: NCAT PULM: diminished breath sounds, no wheezing CV: RRR, no mgr AB: BS+, soft, nontender Ext: no edema Neuro: A&Ox4,  maew  LABS:  CBC  Recent Labs Lab 12/26/13 0415 12/27/13 0449 12/28/13 0523  WBC 14.5* 14.3* 10.9*  HGB 9.7* 9.8* 10.7*  HCT 34.7* 35.7* 38.7  PLT 158 164 168   Coag's No results found for this basename: APTT, INR,  in the last 168 hours BMET  Recent Labs Lab 12/26/13 0415 12/27/13 0449 12/28/13 0523  NA 150* 152* 150*  K 3.5* 4.6 4.5  CL 105 108 106  CO2 31 34* 33*  BUN 76* 58* 39*  CREATININE 0.83 0.76 0.70  GLUCOSE 196* 123* 88   Electrolytes  Recent Labs Lab 12/25/13 0320 12/26/13 0415 12/27/13 0449 12/28/13 0523  CALCIUM 8.6 8.6 8.7 8.7  MG 2.3 2.4  --   --   PHOS 4.5 3.7  --   --    Sepsis Markers  Recent Labs Lab 12/23/13 2302 12/24/13 0325 12/24/13 1923  LATICACIDVEN 0.86  --   --   PROCALCITON  --  0.32 0.33   ABG  Recent Labs Lab 12/24/13 1829 12/25/13 0825 12/26/13 0416  PHART 7.278* 7.395 7.382  PCO2ART 74.4* 58.0* 61.7*  PO2ART 70.0* 55.0* 73.0*   Liver Enzymes  Recent Labs Lab 12/23/13 2145 12/26/13 0416  AST 16 10  ALT 59* 25  ALKPHOS 98 74  BILITOT 0.7 <0.2*  ALBUMIN 3.1* 2.6*   Cardiac Enzymes  Recent  Labs Lab 12/23/13 2145 12/24/13 0325 12/24/13 0910 12/24/13 1358  TROPONINI <0.30 <0.30 <0.30 <0.30  PROBNP 10387.0*  --   --   --    Glucose  Recent Labs Lab 12/27/13 0406 12/27/13 0821 12/27/13 1126 12/27/13 1651 12/27/13 2146 12/28/13 0819  GLUCAP 120* 119* 144* 164* 111* 84   IMAGING:   Dg Chest Port 1 View  12/28/2013   CLINICAL DATA:  Respiratory failure, intubated  EXAM: PORTABLE CHEST - 1 VIEW  COMPARISON:  12/26/2013  FINDINGS: Endotracheal and nasogastric tubes have been removed. Right IJ central line tip remains at the mid SVC level. Left axillary clips are noted. Lungs are hypoaerated with crowding of the bronchovascular markings. Bilateral lower lobe dependent atelectasis noted. No significant pleural effusion. Aeration is minimally improved since previously.  IMPRESSION: Minimal  improvement in aeration with persistent low lung volumes and bibasilar atelectasis.   Electronically Signed   By: Conchita Paris M.D.   On: 12/28/2013 07:41   ASSESSMENT / PLAN:  A:  Acute on chronic respiratory failure due to severe CAP  L hemidiaphragm elevation, paralysis? ( curious if related to radiation to R chest for breast CA) Probable COPD with exacerbation  Tobacco use disorder  P:  Wean off O2 Wean prednisone off over 5 days Outpatient regimen: Spiriva daily, albuterol PRN I am making arrangements to follow up with me in clinic [facility]) for PFT Sniff test when stable  D/C Dulera  CARDIOVASCULAR  A:  Chronic diastolic congestive heart failure, acute component resolved P:  Goal MAP>65  D/c CVL after PIVs placed  RENAL  A:  Acute on chronic renal failure, improving  Hypernatremia  P:  Trend BMP   GASTROINTESTINAL  A:  Mildly elevated ALT  History of gastric bypass  Nutrition  GIPx  P:  Advance diet as tolerated   HEMATOLOGIC  A:  Normocytic Anemia  History of breast cancer  VTE Px  P:  Trend CBC  Heparin SQ   INFECTIOUS  A:  Pneumococcal pneumonia, afebrile, WBC trending down  P:  Abx / cx as above   ENDOCRINE  A:  Prediabetes, HGB A1C 6.1  P:  SSI    Transfer to floor, Family Practice service PCCM to sign off  Roselie Awkward, MD [place] [facility] Pager: [phone] Cell: [phone] If no response, call [phone]   12/28/2013, 3:49 PM

## 2013-12-28 NOTE — Progress Notes (Signed)
Utilization review completed.  

## 2013-12-28 NOTE — Progress Notes (Signed)
Report called to [facility], [place], on [place], all questions answered.  Patient and family aware of new room.  Central line discontinued intact using hospital protocol.  Patient without complaints.

## 2013-12-28 NOTE — Progress Notes (Signed)
Interim Note:   Continuing to follow along with Ms Riccobono's care as Dr Lindell Noe with [facility] is her PCP. FMTS will be happy to resume care when appropriate for transferring out of ICU. We appreciate the efforts of the entire nursing and medical team involved in her care.   4/13: Resp distress; BiPAP; TTE >>> EF 23-53%, grade 1 diastolic dysfcn, severe dilated right atrium  4/14: Strep Pneumo +; Intubated 4/15 Extubated and tolerated well 4/16: Transfer to SDU 4/16. Keep on PCCM service.   ANTIBIOTICS:  4/13 Ceftriaxone>>>  4/13 Azithromycin >>> 4/15   Olam Idler, MD, PGY-1  [facility] Pager: [phone]

## 2013-12-29 ENCOUNTER — Inpatient Hospital Stay (HOSPITAL_COMMUNITY): Payer: Medicare Other

## 2013-12-29 LAB — CBC
HCT: 36.9 % (ref 36.0–46.0)
Hemoglobin: 10.1 g/dL — ABNORMAL LOW (ref 12.0–15.0)
MCH: 24 pg — ABNORMAL LOW (ref 26.0–34.0)
MCHC: 27.4 g/dL — ABNORMAL LOW (ref 30.0–36.0)
MCV: 87.6 fL (ref 78.0–100.0)
Platelets: 146 10*3/uL — ABNORMAL LOW (ref 150–400)
RBC: 4.21 MIL/uL (ref 3.87–5.11)
RDW: 17.7 % — ABNORMAL HIGH (ref 11.5–15.5)
WBC: 10.7 10*3/uL — ABNORMAL HIGH (ref 4.0–10.5)

## 2013-12-29 LAB — BASIC METABOLIC PANEL
BUN: 28 mg/dL — ABNORMAL HIGH (ref 6–23)
CO2: 32 mEq/L (ref 19–32)
Calcium: 8.3 mg/dL — ABNORMAL LOW (ref 8.4–10.5)
Chloride: 106 mEq/L (ref 96–112)
Creatinine, Ser: 0.57 mg/dL (ref 0.50–1.10)
GFR calc Af Amer: >90 mL/min (ref >90)
GFR calc non Af Amer: >90 mL/min (ref >90)
Glucose, Bld: 87 mg/dL (ref 70–99)
Potassium: 4.8 mEq/L (ref 3.7–5.3)
Sodium: 148 mEq/L — ABNORMAL HIGH (ref 137–147)

## 2013-12-29 LAB — GLUCOSE, CAPILLARY
Glucose-Capillary: 84 mg/dL (ref 70–99)
Glucose-Capillary: 89 mg/dL (ref 70–99)
Glucose-Capillary: 149 mg/dL — ABNORMAL HIGH (ref 70–99)
Glucose-Capillary: 108 mg/dL — ABNORMAL HIGH (ref 70–99)

## 2013-12-29 MED ORDER — ALBUTEROL SULFATE (2.5 MG/3ML) 0.083% IN NEBU
2.5000 mg | INHALATION_SOLUTION | Freq: Two times a day (BID) | RESPIRATORY_TRACT | Status: DC
  Administered 2013-12-30 – 2013-12-31 (×3): 2.5 mg via RESPIRATORY_TRACT
  Filled 2013-12-29 (×4): qty 3

## 2013-12-29 MED ORDER — PREDNISONE 20 MG PO TABS
30.0000 mg | ORAL_TABLET | Freq: Every day | ORAL | Status: AC
  Administered 2013-12-31: 30 mg via ORAL
  Filled 2013-12-29: qty 1

## 2013-12-29 MED ORDER — PREDNISONE 20 MG PO TABS
40.0000 mg | ORAL_TABLET | Freq: Every day | ORAL | Status: AC
  Administered 2013-12-30: 40 mg via ORAL
  Filled 2013-12-29: qty 2

## 2013-12-29 MED ORDER — PREDNISONE 20 MG PO TABS
20.0000 mg | ORAL_TABLET | Freq: Every day | ORAL | Status: DC
  Filled 2013-12-29: qty 1

## 2013-12-29 MED ORDER — PREDNISONE 50 MG PO TABS
50.0000 mg | ORAL_TABLET | Freq: Every day | ORAL | Status: DC
Start: 2013-12-30 — End: 2013-12-30
  Filled 2013-12-29 (×2): qty 1

## 2013-12-29 MED ORDER — LEVOFLOXACIN 750 MG PO TABS
750.0000 mg | ORAL_TABLET | Freq: Every day | ORAL | Status: DC
  Administered 2013-12-29 – 2013-12-31 (×3): 750 mg via ORAL
  Filled 2013-12-29 (×3): qty 1

## 2013-12-29 MED ORDER — PREDNISONE 10 MG PO TABS: 10.0000 mg | ORAL_TABLET | Freq: Every day | ORAL | Status: DC

## 2013-12-29 NOTE — Progress Notes (Signed)
FMTS Attending Daily Note:  Annabell Sabal MD  [phone] pager  Family Practice pager:  [phone] I have discussed this patient with the resident Dr. Berkley Harvey.  I agree with their findings, assessment, and care plan

## 2013-12-29 NOTE — Discharge Summary (Signed)
[facility] Discharge Summary  Patient name: [name] Medical record number: [id number] Date of birth: 1951-08-05 Age: 63 y.o. Gender: female Date of Admission: 12/23/2013  Date of Discharge: 12/31/13 Admitting Physician: Blane Ohara McDiarmid, MD  Primary Care Provider: Willeen Niece, MD Consultants: CCM  Indication for Hospitalization: Respiratory Distress, PNA  Discharge Diagnoses/Problem List:   Respiratory distress requiring intubation  CAP (Strep Pneumo +)  Likely AECOPD  Anemia  Prediabetes  AKI- resolved  Disposition: Home  Discharge Condition: stable  Brief Hospital Course:  63-yo-female with poor medical follow-up who presented with RML PNA (Strep Pneumo +) with acute hypercarbic respiratory failure requiring intubation. PCCM consulted due to continued hypercarbia on ABG and was intubated on 12/25/13 She was successfully extubated on 4/15. She was initially treated with CTX, Azithro and stress dose steriods, and was weaned to Levaquin and Prednisone. Her breathing was much improved on discharge but still requiring 3L [place] continuous to maintain stats > 88%. She was discharged to complete antibiotics and prednisone taper.   Respiratory Distress: PNA - Strep Pneumo +, Probable AECOPD. Initially required BiPAP then intubation. Initial ABGs showed resp acidosis which had resolved on discharge. BNP 10387 on admission & TE showed EF 17-61%, grade 1 diastolic dysfunction, severe dilated right atrium.  L hemidiaphragm was elevated on CXR, but sniff test showed no paralysis. Discharged to complete ten day total course of antibiotics: Levaquin ( 4/17 >>end date 4/23); 4/13 Ceftriaxone>>>4/17 ; 4/13 Azithromycin >>> 4/15. Prednisone was being weaned on discharge. Started on spiriva daily and albuterol q4 PRN.  McQuaid to f/u in outpt clinic for PFTs and likely start inhaled steroid/LABA. She continued to require oxygen on discharge (3L [place])   Anemia:  Normocytic, stable  Prediabetes; HGB A1C 6.1  Hx of HTN: No PCP visit since 4/'13. No current BP meds  AKI: Resolved   Issues for Follow Up:   PFTs; Start ICS/LABA?   Smoking cessation  Home oxygen use, requirement?  Significant Procedures: intubation  Significant Labs and Imaging:   Recent Labs Lab 12/29/13 0530 12/30/13 0553 12/31/13 0301  WBC 10.7* 10.5 11.0*  HGB 10.1* 10.0* 9.4*  HCT 36.9 35.9* 34.2*  PLT 146* 123* 153    Recent Labs Lab 12/25/13 0320 12/26/13 0415 12/26/13 0416 12/27/13 0449 12/28/13 0523 12/29/13 0530 12/30/13 0553 12/31/13 0301  NA 148* 150*  --  152* 150* 148* 146 148*  K 4.1 3.5*  --  4.6 4.5 4.8 4.0 3.9  CL 104 105  --  108 106 106 107 108  CO2 28 31  --  34* 33* 32 30 30  GLUCOSE 102* 196*  --  123* 88 87 71 84  BUN 65* 76*  --  58* 39* 28* 24* 19  CREATININE 1.14* 0.83  --  0.76 0.70 0.57 0.54 0.54  CALCIUM 8.6 8.6  --  8.7 8.7 8.3* 8.0* 7.9*  MG 2.3 2.4  --   --   --   --   --   --   PHOS 4.5 3.7  --   --   --   --   --   --   ALKPHOS  --   --  74  --   --   --   --   --   AST  --   --  10  --   --   --   --   --   ALT  --   --  25  --   --   --   --   --  ALBUMIN  --   --  2.6*  --   --   --   --   --     Recent Labs Lab 12/24/13 1633 12/24/13 1829 12/25/13 0825 12/26/13 0416  PHART 7.243* 7.278* 7.395 7.382  PCO2ART 89.1* 74.4* 58.0* 61.7*  PO2ART 335.0* 70.0* 55.0* 73.0*  HCO3 38.5* 34.8* 35.5* 36.8*  TCO2 41 37 37 39  O2SAT 100.0 90.0 87.0 94.0   BNP    Component Value Date/Time   PROBNP 10387.0* 12/23/2013 2145   Imaging/Diagnostic Tests:  4/12 CXR: RML PNA  4/14 Port CXR: Mild elevation of the left hemidiaphragm. Bibasilar atelectasis, vascular congestion.  4/15 Port CXR: Evidence of a degree of congestive heart failure superimposed on emphysematous change. No airspace consolidation.  4/17 Port CXR: Minimal improvement in aeration with persistent low lung volumes   ECHO Study Conclusions  - Left  ventricle: The cavity size was normal. There was mild concentric hypertrophy. Systolic function was normal. The estimated ejection fraction was in the range of 60% to 65%. Wall motion was normal; there were no regional wall motion abnormalities. Doppler parameters are consistent with abnormal left ventricular relaxation (grade 1 diastolic dysfunction). The E/e' ratio is <10, suggesting normal LV filling pressure. - Mitral valve: Moderately calcified annulus. Trivial regurgitation. - Right ventricle: The cavity size was normal. Wall thickness was normal. Systolic function was normal. RV systolic pressure: 24MP Hg (S, est). - Right atrium: Severely dilated (28 cm2). - Tricuspid valve: Mild regurgitation.   Results/Tests Pending at Time of Discharge: none  Discharge Medications:    Medication List         albuterol 108 (90 BASE) MCG/ACT inhaler  Commonly known as:  PROVENTIL HFA;VENTOLIN HFA  Inhale 2 puffs into the lungs every 6 (six) hours as needed for wheezing or shortness of breath.     levofloxacin 750 MG tablet  Commonly known as:  LEVAQUIN  Take 1 tablet (750 mg total) by mouth daily.     predniSONE 10 MG tablet  Commonly known as:  DELTASONE  Take 1 tablet (10 mg total) by mouth daily with breakfast.  Start taking on:  01/02/2014     Spacer/Aero-Holding Dorise Bullion  1 Device by Does not apply route once.     tiotropium 18 MCG inhalation capsule  Commonly known as:  SPIRIVA  Place 1 capsule (18 mcg total) into inhaler and inhale daily.        Discharge Instructions: Please refer to Patient Instructions section of EMR for full details.  Patient was counseled important signs and symptoms that should prompt return to medical care, changes in medications, dietary instructions, activity restrictions, and follow up appointments.   Follow-Up Appointments: Follow-up Information   Follow up with Willeen Niece, MD On 01/15/2014. (Apt at [facility] for Hospital follow-up )     Specialty:  Family Medicine   Contact information:   [place] [address] [phone]       Schedule an appointment as soon as possible for a visit with Simonne Maffucci, MD. (For pulmonary function test)    Specialty:  Pulmonary Disease   Contact information:   [place] [address] [place] [address] [phone]       Phill Myron, MD 12/31/2013, 2:42 PM PGY-1, [place]

## 2013-12-29 NOTE — Progress Notes (Signed)
Patient's central line removed in stepdown prior to arrival to the floor.  No other peripheral access.  Patient scheduled to received IV ABX.  Patient is requesting that we not start another PIV since she is a difficult stick.  She wants to take oral ABX.  Dr. Jimmy Footman called and made aware.  She stated to defer decision to AM [facility].  Will continue to monitor.  [employer] RN-BC, WTA.

## 2013-12-29 NOTE — Progress Notes (Signed)
Family Medicine Teaching Service Daily Progress Note Intern Pager: [phone]  Patient name: [name] Medical record number: [id number] Date of birth: March 14, 1951 Age: 63 y.o. Gender: female  Primary Care Provider: Willeen Niece, MD Consultants: CCM Code Status: full  Pt Overview and Major Events to Date:  4/13: Resp distress; BiPAP; TTE >>> EF 29-92%, grade 1 diastolic dysfcn, severe dilated right atrium  4/14: Strep Pneumo +; Intubated  4/15 Extubated and tolerated well  4/16: Transfer to SDU 4/16. Keep on PCCM service. 4/17: Weaning steroids - transferring to [facility] in am 4/18: Switched to Levaquin; Stopped dulera; weaning O2 and prednisone  Assessment and Plan: 63-yo-female with poor medical follow-up with RML PNA (Strep Pneumo +) with acute hypercarbic respiratory failure requiring intubation. PCCM consulted due to continued hypercarbia on ABG. Transferring out of ICU to [facility] on 4/18.   # Respiratory Distress (PNA - Strep Pneumo +, Probable AECOPD, Possible AECHF ) - Initially required BiPAP then intubation. ABGs showed resp acidosis which has improved; CO2 now ~ 60s. BNP 10387 on admission & TE showed EF 42-68%, grade 1 diastolic dysfunction, severe dilated right atrium.  - L hemidiaphragm elevation, paralysis? ( curious if related to radiation to R chest for breast CA) - Abx: Levaquin ( 4/17 >>) ; 4/13 Ceftriaxone>>>4/17 ; 4/13 Azithromycin >>> 4/15  - WBC trending down; BCx: NG x 5 days - Prednisone: 50 mg today - weaning (dec 10 mg daily)  - Dulera (d/c'd 4/18) , Spiriva daily, albuterol q4 PRN; McQuaid to f/u in outpt clinic  - O2 [place] at 3L; keep stats 88-92%  - Sniff test when stable   # Anemia: Normocytic, stable  - trend CBC  # Prediabetes  - HGB A1C 6.1  - SSI  # Hx of HTN - No PCP visit since 4/'13 - No current BP meds   # AKI: Resolved - trend BMP  FEN/GI: KVO; Diet reg PPx: heparin  Disposition: Discharge pending improved respiratory fcn  Subjective:   Breathing improving; No complaints. Wants to go as natural as possible with medications.   Objective: Temp:  [98 F (36.7 C)-98.6 F (37 C)] 98.4 F (36.9 C) (04/18 0423) Pulse Rate:  [67-78] 67 (04/18 0423) Resp:  [18-23] 18 (04/18 0423) BP: (117-145)/(62-83) 121/80 mmHg (04/18 0423) SpO2:  [87 %-98 %] 93 % (04/18 0423) Weight:  [230 lb 13.2 oz (104.7 kg)] 230 lb 13.2 oz (104.7 kg) (04/17 1937) Physical Exam: General: NAD  PULM: diminished breath sounds throughout;  no wheezing or rhonchi, rales CV: RRR, no mgr  Ext: no edema  Neuro: A&Ox4,   Laboratory:  Recent Labs Lab 12/27/13 0449 12/28/13 0523 12/29/13 0530  WBC 14.3* 10.9* 10.7*  HGB 9.8* 10.7* 10.1*  HCT 35.7* 38.7 36.9  PLT 164 168 146*    Recent Labs Lab 12/23/13 2145  12/26/13 0416 12/27/13 0449 12/28/13 0523 12/29/13 0530  NA 145  < >  --  152* 150* 148*  K 4.1  < >  --  4.6 4.5 4.8  CL 100  < >  --  108 106 106  CO2 32  < >  --  34* 33* 32  BUN 65*  < >  --  58* 39* 28*  CREATININE 1.50*  < >  --  0.76 0.70 0.57  CALCIUM 8.5  < >  --  8.7 8.7 8.3*  PROT 6.9  --  5.9*  --   --   --   BILITOT 0.7  --  <0.2*  --   --   --  ALKPHOS 98  --  74  --   --   --   ALT 59*  --  25  --   --   --   AST 16  --  10  --   --   --   GLUCOSE 126*  < >  --  123* 88 87  < > = values in this interval not displayed.   Recent Labs Lab 12/24/13 0717 12/24/13 1633 12/24/13 1829 12/25/13 0825 12/26/13 0416  PHART 7.254* 7.243* 7.278* 7.395 7.382  PCO2ART 82.6* 89.1* 74.4* 58.0* 61.7*  PO2ART 84.0 335.0* 70.0* 55.0* 73.0*   BNP (last 3 results)  Recent Labs  12/23/13 2145  PROBNP 10387.0*    Imaging/Diagnostic Tests:  4/12 CXR: RML PNA 4/14 Port CXR: Mild elevation of the left hemidiaphragm. Bibasilar atelectasis, vascular congestion.  4/15 Port CXR: Evidence of a degree of congestive heart failure superimposed on emphysematous change. No airspace consolidation. 4/17 Port CXR: Minimal improvement in  aeration with persistent low lung volumes   Phill Myron, MD 12/29/2013, 7:33 AM PGY-1, [place] Intern pager: [phone], text pages welcome

## 2013-12-30 DIAGNOSIS — R0609 Other forms of dyspnea: Secondary | ICD-10-CM

## 2013-12-30 DIAGNOSIS — R0989 Other specified symptoms and signs involving the circulatory and respiratory systems: Secondary | ICD-10-CM

## 2013-12-30 DIAGNOSIS — D509 Iron deficiency anemia, unspecified: Secondary | ICD-10-CM

## 2013-12-30 LAB — CULTURE, BLOOD (ROUTINE X 2)
Culture: NO GROWTH
Culture: NO GROWTH

## 2013-12-30 LAB — CBC
HCT: 35.9 % — ABNORMAL LOW (ref 36.0–46.0)
Hemoglobin: 10 g/dL — ABNORMAL LOW (ref 12.0–15.0)
MCH: 24.6 pg — ABNORMAL LOW (ref 26.0–34.0)
MCHC: 27.9 g/dL — ABNORMAL LOW (ref 30.0–36.0)
MCV: 88.2 fL (ref 78.0–100.0)
Platelets: 123 10*3/uL — ABNORMAL LOW (ref 150–400)
RBC: 4.07 MIL/uL (ref 3.87–5.11)
RDW: 17.4 % — ABNORMAL HIGH (ref 11.5–15.5)
WBC: 10.5 10*3/uL (ref 4.0–10.5)

## 2013-12-30 LAB — BASIC METABOLIC PANEL
BUN: 24 mg/dL — ABNORMAL HIGH (ref 6–23)
CO2: 30 mEq/L (ref 19–32)
Calcium: 8 mg/dL — ABNORMAL LOW (ref 8.4–10.5)
Chloride: 107 mEq/L (ref 96–112)
Creatinine, Ser: 0.54 mg/dL (ref 0.50–1.10)
GFR calc Af Amer: >90 mL/min (ref >90)
GFR calc non Af Amer: >90 mL/min (ref >90)
Glucose, Bld: 71 mg/dL (ref 70–99)
Potassium: 4 mEq/L (ref 3.7–5.3)
Sodium: 146 mEq/L (ref 137–147)

## 2013-12-30 LAB — GLUCOSE, CAPILLARY
Glucose-Capillary: 85 mg/dL (ref 70–99)
Glucose-Capillary: 101 mg/dL — ABNORMAL HIGH (ref 70–99)
Glucose-Capillary: 145 mg/dL — ABNORMAL HIGH (ref 70–99)

## 2013-12-30 NOTE — Progress Notes (Signed)
FMTS Attending Daily Note:  Tammy Sabal MD  [phone] pager  Family Practice pager:  [phone] I have seen and examined this patient and have reviewed their chart. I have discussed this patient with the resident. I agree with the resident's findings, assessment and care plan.  Additionally:  - very much improved.  Walking to restroom and up and down hallway on her own without O2.  - Need ambulatory pulse ox.  If this is normal, able to DC home with steroids, antibiotics, and outpt PCP/Pulm FU.    Alveda Reasons, MD 12/30/2013 12:28 PM

## 2013-12-30 NOTE — Progress Notes (Signed)
Due to patient's insurance and lack of chronic lung disease, she does not qualify for home oxygen.  Given that, per nursing, her O2 sat dropped to 73 with ambulation and took some time to recover, she will need to remain in the hospital until her ambulatory O2 sat improves.  [name] 12/30/2013, 4:29 PM

## 2013-12-30 NOTE — Progress Notes (Signed)
Ambulated patient from room to nurse's desk without oxygen. O2  sats dropped to 76%. Patient tolerated except for legs feeling "tired and weak when first starting." Patient stated walk made her feel stronger later on. Reapplied O2 @ 2l nasal cannula  and sats increased to 94%. Patient resting in bed without difficulty breathing. Oxygen decreased to 1 L nasal cannula in an attempt to wean.

## 2013-12-30 NOTE — Progress Notes (Signed)
Patient was found to have 02 sats 85% on 1 LPM nasal cannula.  Oxygen increased to 2 LPM, RN notified.

## 2013-12-30 NOTE — Progress Notes (Signed)
Family Medicine Teaching Service Daily Progress Note Intern Pager: [phone]  Patient name: [name] Medical record number: [id number] Date of birth: November 05, 1950 Age: 63 y.o. Gender: female  Primary Care Provider: Willeen Niece, MD Consultants: CCM Code Status: full  Pt Overview and Major Events to Date:  4/13: Resp distress; BiPAP; TTE >>> EF 76-16%, grade 1 diastolic dysfcn, severe dilated right atrium  4/14: Strep Pneumo +; Intubated  4/15 Extubated and tolerated well  4/16: Transfer to SDU 4/16. Keep on PCCM service. 4/17: Weaning steroids - transferring to [facility] in am 4/18: Switched to Levaquin; Stopped dulera; weaning O2 and prednisone  Assessment and Plan: 63-yo-female with poor medical follow-up with RML PNA (Strep Pneumo +) with acute hypercarbic respiratory failure requiring intubation. PCCM consulted due to continued hypercarbia on ABG. Transferring out of ICU to [facility] on 4/18.   # Respiratory Distress (PNA - Strep Pneumo +, Probable AECOPD, Possible AECHF ) - Initially required BiPAP then intubation. ABGs showed resp acidosis which has improved. BNP 10387 on admission & TE showed EF 07-37%, grade 1 diastolic dysfunction, severe dilated right atrium.  - L hemidiaphragm elevation, NO paralysis - Abx: Levaquin ( 4/17 >>end date 4/23) ; 4/13 Ceftriaxone>>>4/17 ; 4/13 Azithromycin >>> 4/15  - WBC trending down; BCx: NG x 5 days - Prednisone: 40 mg today - weaning (dec 10 mg daily)  - Dulera (d/c'd 4/18) , Spiriva daily, albuterol q4 PRN; McQuaid to f/u in outpt clinic  - O2 [place] at 3L; keep stats 88-92%  - wean O2 as tolerated   # Anemia: Normocytic, stable  - trend CBC  # Prediabetes  - HGB A1C 6.1  - SSI  # Hx of HTN - No PCP visit since 4/'13 - No current BP meds  - BP normotensive  # AKI: Resolved - trend BMP  FEN/GI: KVO; Diet reg PPx: heparin  Disposition: Work on weaning O2 today; anticipate D/C tomorrow  Subjective:  Breathing improving; No  complaints.   Objective: Temp:  [97.7 F (36.5 C)-98.6 F (37 C)] 98.5 F (36.9 C) (04/19 0457) Pulse Rate:  [65-80] 68 (04/19 0457) Resp:  [18-20] 18 (04/19 0457) BP: (120-140)/(72-79) 132/79 mmHg (04/19 0457) SpO2:  [89 %-97 %] 94 % (04/19 0457) Weight:  [235 lb 10.8 oz (106.9 kg)] 235 lb 10.8 oz (106.9 kg) (04/18 2100) Physical Exam: General: NAD  PULM: diminished breath sounds RLB;  no wheezing or rhonchi, rales CV: RRR, no mgr  Ext: no edema  Neuro: A&Ox4  Laboratory:  Recent Labs Lab 12/28/13 0523 12/29/13 0530 12/30/13 0553  WBC 10.9* 10.7* 10.5  HGB 10.7* 10.1* 10.0*  HCT 38.7 36.9 35.9*  PLT 168 146* 123*    Recent Labs Lab 12/23/13 2145  12/26/13 0416  12/28/13 0523 12/29/13 0530 12/30/13 0553  NA 145  < >  --   < > 150* 148* 146  K 4.1  < >  --   < > 4.5 4.8 4.0  CL 100  < >  --   < > 106 106 107  CO2 32  < >  --   < > 33* 32 30  BUN 65*  < >  --   < > 39* 28* 24*  CREATININE 1.50*  < >  --   < > 0.70 0.57 0.54  CALCIUM 8.5  < >  --   < > 8.7 8.3* 8.0*  PROT 6.9  --  5.9*  --   --   --   --  BILITOT 0.7  --  <0.2*  --   --   --   --   ALKPHOS 98  --  74  --   --   --   --   ALT 59*  --  25  --   --   --   --   AST 16  --  10  --   --   --   --   GLUCOSE 126*  < >  --   < > 88 87 71  < > = values in this interval not displayed.   Recent Labs Lab 12/24/13 0717 12/24/13 1633 12/24/13 1829 12/25/13 0825 12/26/13 0416  PHART 7.254* 7.243* 7.278* 7.395 7.382  PCO2ART 82.6* 89.1* 74.4* 58.0* 61.7*  PO2ART 84.0 335.0* 70.0* 55.0* 73.0*   BNP (last 3 results)  Recent Labs  12/23/13 2145  PROBNP 10387.0*    Imaging/Diagnostic Tests:  4/12 CXR: RML PNA 4/14 Port CXR: Mild elevation of the left hemidiaphragm. Bibasilar atelectasis, vascular congestion.  4/15 Port CXR: Evidence of a degree of congestive heart failure superimposed on emphysematous change. No airspace consolidation. 4/17 Port CXR: Minimal improvement in aeration with  persistent low lung volumes   Melony Overly, MD 12/30/2013, 9:39 AM PGY-3, [place] Intern pager: [phone], text pages welcome

## 2013-12-31 DIAGNOSIS — R6521 Severe sepsis with septic shock: Secondary | ICD-10-CM

## 2013-12-31 DIAGNOSIS — A419 Sepsis, unspecified organism: Secondary | ICD-10-CM

## 2013-12-31 DIAGNOSIS — I10 Essential (primary) hypertension: Secondary | ICD-10-CM

## 2013-12-31 DIAGNOSIS — N289 Disorder of kidney and ureter, unspecified: Secondary | ICD-10-CM

## 2013-12-31 LAB — BASIC METABOLIC PANEL
BUN: 19 mg/dL (ref 6–23)
CO2: 30 mEq/L (ref 19–32)
Calcium: 7.9 mg/dL — ABNORMAL LOW (ref 8.4–10.5)
Chloride: 108 mEq/L (ref 96–112)
Creatinine, Ser: 0.54 mg/dL (ref 0.50–1.10)
GFR calc Af Amer: >90 mL/min (ref >90)
GFR calc non Af Amer: >90 mL/min (ref >90)
Glucose, Bld: 84 mg/dL (ref 70–99)
Potassium: 3.9 mEq/L (ref 3.7–5.3)
Sodium: 148 mEq/L — ABNORMAL HIGH (ref 137–147)

## 2013-12-31 LAB — CBC
HCT: 34.2 % — ABNORMAL LOW (ref 36.0–46.0)
Hemoglobin: 9.4 g/dL — ABNORMAL LOW (ref 12.0–15.0)
MCH: 24 pg — ABNORMAL LOW (ref 26.0–34.0)
MCHC: 27.5 g/dL — ABNORMAL LOW (ref 30.0–36.0)
MCV: 87.5 fL (ref 78.0–100.0)
Platelets: 153 10*3/uL (ref 150–400)
RBC: 3.91 MIL/uL (ref 3.87–5.11)
RDW: 17.4 % — ABNORMAL HIGH (ref 11.5–15.5)
WBC: 11 10*3/uL — ABNORMAL HIGH (ref 4.0–10.5)

## 2013-12-31 LAB — GLUCOSE, CAPILLARY
Glucose-Capillary: 149 mg/dL — ABNORMAL HIGH (ref 70–99)
Glucose-Capillary: 75 mg/dL (ref 70–99)
Glucose-Capillary: 101 mg/dL — ABNORMAL HIGH (ref 70–99)

## 2013-12-31 MED ORDER — ALBUTEROL SULFATE HFA 108 (90 BASE) MCG/ACT IN AERS: 2.0000 | INHALATION_SPRAY | Freq: Four times a day (QID) | RESPIRATORY_TRACT | Status: DC | PRN

## 2013-12-31 MED ORDER — PREDNISONE 10 MG PO TABS: 10.0000 mg | ORAL_TABLET | Freq: Every day | ORAL | Status: DC

## 2013-12-31 MED ORDER — LEVOFLOXACIN 750 MG PO TABS: 750.0000 mg | ORAL_TABLET | Freq: Every day | ORAL | Status: DC

## 2013-12-31 MED ORDER — TIOTROPIUM BROMIDE MONOHYDRATE 18 MCG IN CAPS
18.0000 ug | ORAL_CAPSULE | Freq: Every day | RESPIRATORY_TRACT | Status: DC
  Filled 2013-12-31: qty 5

## 2013-12-31 MED ORDER — TIOTROPIUM BROMIDE MONOHYDRATE 18 MCG IN CAPS: 18.0000 ug | ORAL_CAPSULE | Freq: Every day | RESPIRATORY_TRACT | Status: DC

## 2013-12-31 MED ORDER — SPACER/AERO-HOLDING CHAMBERS DEVI: 1.0000 | Freq: Once | Status: DC

## 2013-12-31 NOTE — Discharge Summary (Signed)
Seen and examined.  Agree with Dr. Milagros Reap DC management and documentation.  She still has an O2 requirement and will need temporary home O2.

## 2013-12-31 NOTE — Progress Notes (Signed)
Physical Therapy Treatment Note   SATURATION QUALIFICATIONS: (This note is used to comply with regulatory documentation for home oxygen)  Patient Saturations on Room Air at Rest = 88%  Patient Saturations on Room Air while Ambulating = 77%  Patient Saturations on 3 Liters of oxygen while Ambulating = 89-90%  Please briefly explain why patient needs home oxygen: Pt requires supplemental O2 to keep O2 saturation at safe levels with activity.  (Full PT note to follow)  Roney Marion, [place] Pager [phone] Office [phone]

## 2013-12-31 NOTE — Discharge Instructions (Signed)
[name] you were hospitalized for pneumonia and likely acute exacerbation of COPD (chronic obstructive pulmonary disease) that required you to be intubated. You were treated with antibiotics, steroids, respiratory inhalers and oxygen.   You should continue Levaquin (antibiotics) for three days  You will need continuous oxygen until stopped by a physician  You should continue Spiriva daily and use albuterol as needed for shortness of breath  You will need to call Dr [name] to make an appointment to see him and get pulmonary function testing   You should take prednisone 20mg  (two pills) tomorrow, and then 10mg  (1 pill) Wednesday. No steroids needed after two days.  You have an appointment with Dr [name] to discuss this hospitalization and optimize therapy going forward  Chronic Obstructive Pulmonary Disease Chronic obstructive pulmonary disease (COPD) is a common lung condition in which airflow from the lungs is limited. COPD is a general term that can be used to describe many different lung problems that limit airflow, including both chronic bronchitis and emphysema. If you have COPD, your lung function will probably never return to normal, but there are measures you can take to improve lung function and make yourself feel better.  CAUSES   Smoking (common).   Exposure to secondhand smoke.   Genetic problems.  Chronic inflammatory lung diseases or recurrent infections. SYMPTOMS   Shortness of breath, especially with physical activity.   Deep, persistent (chronic) cough with a large amount of thick mucus.   Wheezing.   Rapid breaths (tachypnea).   Gray or bluish discoloration (cyanosis) of the skin, especially in fingers, toes, or lips.   Fatigue.   Weight loss.   Frequent infections or episodes when breathing symptoms become much worse (exacerbations).   Chest tightness. DIAGNOSIS  Your healthcare provider will take a medical history and perform a physical  examination to make the initial diagnosis. Additional tests for COPD may include:   Lung (pulmonary) function tests.  Chest X-ray.  CT scan.  Blood tests. TREATMENT  Treatment available to help you feel better when you have COPD include:   Inhaler and nebulizer medicines. These help manage the symptoms of COPD and make your breathing more comfortable  Supplemental oxygen. Supplemental oxygen is only helpful if you have a low oxygen level in your blood.   Exercise and physical activity. These are beneficial for nearly all people with COPD. Some people may also benefit from a pulmonary rehabilitation program. HOME CARE INSTRUCTIONS   Take all medicines (inhaled or pills) as directed by your health care provider.  Only take over-the-counter or prescription medicines for pain, fever, or discomfort as directed by your health care provider.   Avoid over-the-counter medicines or cough syrups that dry up your airway (such as antihistamines) and slow down the elimination of secretions unless instructed otherwise by your healthcare provider.   If you are a smoker, the most important thing that you can do is stop smoking. Continuing to smoke will cause further lung damage and breathing trouble. Ask your health care provider for help with quitting smoking. He or she can direct you to community resources or hospitals that provide support.  Avoid exposure to irritants such as smoke, chemicals, and fumes that aggravate your breathing.  Use oxygen therapy and pulmonary rehabilitation if directed by your health care provider. If you require home oxygen therapy, ask your healthcare provider whether you should purchase a pulse oximeter to measure your oxygen level at home.   Avoid contact with individuals who have a contagious  illness.  Avoid extreme temperature and humidity changes.  Eat healthy foods. Eating smaller, more frequent meals and resting before meals may help you maintain your  strength.  Stay active, but balance activity with periods of rest. Exercise and physical activity will help you maintain your ability to do things you want to do.  Preventing infection and hospitalization is very important when you have COPD. Make sure to receive all the vaccines your health care provider recommends, especially the pneumococcal and influenza vaccines. Ask your healthcare provider whether you need a pneumonia vaccine.  Learn and use relaxation techniques to manage stress.  Learn and use controlled breathing techniques as directed by your health care provider. Controlled breathing techniques include:   Pursed lip breathing. Start by breathing in (inhaling) through your nose for 1 second. Then, purse your lips as if you were going to whistle and breathe out (exhale) through the pursed lips for 2 seconds.   Diaphragmatic breathing. Start by putting one hand on your abdomen just above your waist. Inhale slowly through your nose. The hand on your abdomen should move out. Then purse your lips and exhale slowly. You should be able to feel the hand on your abdomen moving in as you exhale.   Learn and use controlled coughing to clear mucus from your lungs. Controlled coughing is a series of short, progressive coughs. The steps of controlled coughing are:  1. Lean your head slightly forward.  2. Breathe in deeply using diaphragmatic breathing.  3. Try to hold your breath for 3 seconds.  4. Keep your mouth slightly open while coughing twice.  5. Spit any mucus out into a tissue.  6. Rest and repeat the steps once or twice as needed. SEEK MEDICAL CARE IF:   You are coughing up more mucus than usual.   There is a change in the color or thickness of your mucus.   Your breathing is more labored than usual.   Your breathing is faster than usual.  SEEK IMMEDIATE MEDICAL CARE IF:   You have shortness of breath while you are resting.   You have shortness of breath that  prevents you from:  Being able to talk.   Performing your usual physical activities.   You have chest pain lasting longer than 5 minutes.   Your skin color is more cyanotic than usual.  You measure low oxygen saturations for longer than 5 minutes with a pulse oximeter. MAKE SURE YOU:   Understand these instructions.  Will watch your condition.  Will get help right away if you are not doing well or get worse. Document Released: 06/09/2005 Document Revised: 06/20/2013 Document Reviewed: 04/26/2013 [facility] Patient Information 2014 [place], [place].

## 2013-12-31 NOTE — Progress Notes (Signed)
Physical Therapy Treatment Patient Details Name: [name] MRN: [id number] DOB: [birth year] Today's Date: 12/31/2013    History of Present Illness Pt adm with right middle lobe pneumonia with acute hypercarbic respiratory failure. Pt intubated 4/13-4/15.PMHx- Lt TKR, pt reports needs Rt TKR    PT Comments    Continuing progress with functional mobility; Continues to require supplemental O2 with activity to keep O2 saturation at safe, acceptable levels  Follow Up Recommendations  No PT follow up;Supervision - Intermittent     Equipment Recommendations  Rolling walker with 5" wheels    Recommendations for Other Services       Precautions / Restrictions Precautions Precautions: Fall Precaution Comments: Fall risk greatly reduced with use of RW Restrictions Weight Bearing Restrictions: No    Mobility  Bed Mobility                  Transfers Overall transfer level: Needs assistance Equipment used: Rolling walker (2 wheeled) Transfers: Sit to/from Stand Sit to Stand: Supervision         General transfer comment: pt with proper sequencing; guarding assist for safety and lines'  Ambulation/Gait Ambulation/Gait assistance: Supervision;Min guard Ambulation Distance (Feet): 200 Feet Assistive device: Rolling walker (2 wheeled) Gait Pattern/deviations: Antalgic (incr lateral sway, torso)     General Gait Details: pt reported her Rt knee feels worse today (as occurs at times) with antalgic gait, especially toward end of walk; O2 sats dropped during amb, required supplemental O2, cues for pursed lip breathing   Stairs            Wheelchair Mobility    Modified Rankin (Stroke Patients Only)       Balance                                    Cognition Arousal/Alertness: Awake/alert Behavior During Therapy: WFL for tasks assessed/performed Overall Cognitive Status: Within Functional Limits for tasks assessed                       Exercises      General Comments        Pertinent Vitals/Pain See other PT note of this date    Home Living                      Prior Function            PT Goals (current goals can now be found in the care plan section) Acute Rehab PT Goals Patient Stated Goal: Return home PT Goal Formulation: With patient Time For Goal Achievement: 01/03/14 Potential to Achieve Goals: Good Progress towards PT goals: Progressing toward goals    Frequency  Min 3X/week    PT Plan Current plan remains appropriate    Co-evaluation             End of Session Equipment Utilized During Treatment: Gait belt;Oxygen Activity Tolerance: Patient tolerated treatment well Patient left: in chair;with call bell/phone within reach     Time: 1053-1118 (minus approx 2 min in bathroom) PT Time Calculation (min): 25 min  Charges:  $Gait Training: 23-37 mins                    G Codes:      [facility] [place] 12/31/2013, 11:28 AM  Roney Marion, [place] Pager [phone] Office [phone]

## 2013-12-31 NOTE — Progress Notes (Signed)
Family Medicine Teaching Service Daily Progress Note Intern Pager: [phone]  Patient name: [name] Medical record number: [id number] Date of birth: [birth year] Age: 63 y.o. Gender: female  Primary Care Provider: Willeen Niece, MD Consultants: CCM Code Status: full  Pt Overview and Major Events to Date:  4/13: Resp distress; BiPAP; TTE >>> EF 54-56%, grade 1 diastolic dysfcn, severe dilated right atrium  4/14: Strep Pneumo +; Intubated  4/15 Extubated and tolerated well  4/16: Transfer to SDU 4/16. Keep on PCCM service. 4/17: Weaning steroids - transferring to [facility] in am 4/18: Switched to Levaquin; Stopped dulera; weaning O2 and prednisone 4/20: attempting to wean o2  Assessment and Plan: 63-yo-female with poor medical follow-up with RML PNA (Strep Pneumo +) with acute hypercarbic respiratory failure requiring intubation. PCCM consulted due to continued hypercarbia on ABG. Transferring out of ICU to [facility] on 4/18.   # Respiratory Distress (PNA - Strep Pneumo +, Probable AECOPD, Possible AECHF ) - Initially required BiPAP then intubation. ABGs showed resp acidosis which has improved. BNP 10387 on admission & TE showed EF 25-63%, grade 1 diastolic dysfunction, severe dilated right atrium.  - L hemidiaphragm elevation, NO paralysis - Abx: Levaquin ( 4/17 >>end date 4/23) ; 4/13 Ceftriaxone>>>4/17 ; 4/13 Azithromycin >>> 4/15  - WBC trending down; BCx: NG x 5 days - Prednisone: 30 mg today - weaning (dec 10 mg daily)  - Dulera (d/c'd 4/18) , Spiriva daily, albuterol q4 PRN; McQuaid to f/u in outpt clinic  - O2 [place] at 3L; keep stats 88-92%  - wean O2 as tolerated; may need home O2 but doesn't have qualifying diagnosis  # Anemia: Normocytic, stable  - trend CBC  # Prediabetes  - HGB A1C 6.1  - SSI  # Hx of HTN - No PCP visit since 4/'13 - No current BP meds  - BP normotensive  # AKI: Resolved - trend BMP  FEN/GI: KVO; Diet reg PPx: heparin  Disposition: Work on weaning  O2 today; anticipate D/C tomorrow  Subjective:  Breathing improving; No complaints.   Objective: Temp:  [97.7 F (36.5 C)-98.8 F (37.1 C)] 98.4 F (36.9 C) (04/20 0759) Pulse Rate:  [69-86] 77 (04/20 0815) Resp:  [16-20] 20 (04/20 0815) BP: (115-132)/(64-75) 123/70 mmHg (04/20 0759) SpO2:  [76 %-94 %] 88 % (04/20 0815) Weight:  [235 lb 8 oz (893.734 kg)] 235 lb 8 oz (106.822 kg) (04/19 2157) Physical Exam: General: NAD  PULM: diminished breath sounds RLB;  no wheezing or rhonchi, rales CV: RRR, no mgr  Ext: no edema  Neuro: A&Ox4  Laboratory:  Recent Labs Lab 12/29/13 0530 12/30/13 0553 12/31/13 0301  WBC 10.7* 10.5 11.0*  HGB 10.1* 10.0* 9.4*  HCT 36.9 35.9* 34.2*  PLT 146* 123* 153    Recent Labs Lab 12/26/13 0416  12/29/13 0530 12/30/13 0553 12/31/13 0301  NA  --   < > 148* 146 148*  K  --   < > 4.8 4.0 3.9  CL  --   < > 106 107 108  CO2  --   < > 32 30 30  BUN  --   < > 28* 24* 19  CREATININE  --   < > 0.57 0.54 0.54  CALCIUM  --   < > 8.3* 8.0* 7.9*  PROT 5.9*  --   --   --   --   BILITOT <0.2*  --   --   --   --   ALKPHOS 74  --   --   --   --  ALT 25  --   --   --   --   AST 10  --   --   --   --   GLUCOSE  --   < > 87 71 84  < > = values in this interval not displayed.   Recent Labs Lab 12/24/13 1633 12/24/13 1829 12/25/13 0825 12/26/13 0416  PHART 7.243* 7.278* 7.395 7.382  PCO2ART 89.1* 74.4* 58.0* 61.7*  PO2ART 335.0* 70.0* 55.0* 73.0*   BNP (last 3 results)  Recent Labs  12/23/13 2145  PROBNP 10387.0*    Imaging/Diagnostic Tests:  4/12 CXR: RML PNA 4/14 Port CXR: Mild elevation of the left hemidiaphragm. Bibasilar atelectasis, vascular congestion.  4/15 Port CXR: Evidence of a degree of congestive heart failure superimposed on emphysematous change. No airspace consolidation. 4/17 Port CXR: Minimal improvement in aeration with persistent low lung volumes   Phill Myron, MD 12/31/2013, 9:55 AM PGY-1, [place] Intern pager: [phone], text pages welcome

## 2013-12-31 NOTE — Progress Notes (Signed)
CARE MANAGEMENT NOTE 12/31/2013  Patient:  [name], [name]   Account Number:  [id number]  Date Initiated:  12/24/2013  Documentation initiated by:  Luz Lex  Subjective/Objective Assessment:   Admitted with increased SOB requiring bipap     Action/Plan:   PT eval  O2   Anticipated DC Date:  12/31/2013   Anticipated DC Plan:  [place]  CM consult      Choice offered to / List presented to:     DME arranged  [facility]      DME agency  [facility].        Status of service:  Completed, signed off Medicare Important Message given?   (If response is "NO", the following Medicare IM given date fields will be blank) Date Medicare IM given:   Date Additional Medicare IM given:    Discharge Disposition:  HOME/SELF CARE  Per UR Regulation:  Reviewed for med. necessity/level of care/duration of stay  If discussed at [place] of Stay Meetings, dates discussed:    Comments:  Contact:  [name],[name] Daughter [phone]  12/31/2013  [place], [place] [phone] Oxygen evaluation completed. [place] called with results, states patient will qualify for O2 MD aware.  spoke with patient regarding the need for O2 at home. Explained [facility] will provide the O2 tank at discharge and concentrator in the home. Suggested she speak with the provider about costs and size of the equipment. She declined the RW, has one at home.  12/31/2013  0900 [place], [place] CM referral:  O2 at home  12/28/13- 1400- Marvetta Gibbons RN, BSN [phone] Pt now extubated and moved to SDU on 12/27/13- currently on 4L 02- per PT eval no f/u recommendations made- NCM to continue to follow for any d/c needs.  12-25-13 10:30am Luz Lex, RNBSN [phone] Patient now intubated.

## 2013-12-31 NOTE — Progress Notes (Signed)
Seen and examined.  Agree with Dr. Milagros Reap management and documentation.  Because we are able to arrange home O2, OK to DC today.

## 2013-12-31 NOTE — Progress Notes (Signed)
Paged April RT, pt denies shortness of breath but O2 on 3 lpm via [place] 90% and goes back to upper 80's.  Pt asymptomatic to O2 sat of 90%, RT will be up to give breathing tx.

## 2013-12-31 NOTE — Progress Notes (Signed)
Discharge instructions given, pt verbalized understanding.  VSS. Denies pain. PT left floor via wheelchair accompanied by staff and family with O2 tank on 2 lpm via [place].

## 2013-12-31 NOTE — Progress Notes (Signed)
PT working with patient,  O2 sat 77% on RA, 89% at rest on 2 lpm via [place].  Pt ambulating in hall with PT and walker O2 sat 86% while ambulating.

## 2014-01-01 ENCOUNTER — Telehealth: Payer: Self-pay | Admitting: Family Medicine

## 2014-01-01 ENCOUNTER — Telehealth: Payer: Self-pay | Admitting: Pulmonary Disease

## 2014-01-01 NOTE — Telephone Encounter (Signed)
Per discharge summary: Schedule an appointment as soon as possible for a visit with Simonne Maffucci, MD. (For pulmonary function test)  Specialty: Pulmonary Disease  Contact information:  [address]  [place] [address]  [phone]    [email or web address]. [place] Bing, CMA

## 2014-01-01 NOTE — Telephone Encounter (Signed)
Pt returned call.  [name] ° °

## 2014-01-01 NOTE — Telephone Encounter (Signed)
I called and scheduled pt PFT 01/03/14 at 1 PM.  She is aware no smoking or inhaler use 4 hrs prior to appt. Aware [place] office. Nothing further needed

## 2014-01-01 NOTE — Telephone Encounter (Signed)
Spiriva RX will be over $300 dollars for patient. Patient requesting something cheaper. States she is not having much dyspnea since being D/C for hospital.

## 2014-01-02 ENCOUNTER — Telehealth: Payer: Self-pay

## 2014-01-02 NOTE — Telephone Encounter (Signed)
Pt scheduled for 01/17/14 with BQ.  Nothing further needed.

## 2014-01-02 NOTE — Telephone Encounter (Signed)
Message copied by Len Blalock on Wed Jan 02, 2014  2:11 PM ------      Message from: Horatio Pel      Created: Fri Dec 28, 2013  4:19 PM                   ----- Message -----         From: Juanito Doom, MD         Sent: 12/28/2013   4:06 PM           To: [facility] Triage Pool            All,            Please schedule a hospital follow up with me 3-6 weeks: pneumonia, possible COPD.            Thanks      B ------

## 2014-01-03 ENCOUNTER — Ambulatory Visit (INDEPENDENT_AMBULATORY_CARE_PROVIDER_SITE_OTHER): Payer: Medicare Other | Admitting: Pulmonary Disease

## 2014-01-03 ENCOUNTER — Other Ambulatory Visit: Payer: Self-pay | Admitting: Pulmonary Disease

## 2014-01-03 DIAGNOSIS — R0602 Shortness of breath: Secondary | ICD-10-CM | POA: Diagnosis not present

## 2014-01-03 NOTE — Progress Notes (Signed)
PFT done today. 

## 2014-01-07 ENCOUNTER — Telehealth: Payer: Self-pay

## 2014-01-07 ENCOUNTER — Encounter: Payer: Self-pay | Admitting: Pulmonary Disease

## 2014-01-07 NOTE — Telephone Encounter (Signed)
lmtcb X1 to relay pft results

## 2014-01-07 NOTE — Telephone Encounter (Signed)
Message copied by Len Blalock on Mon Jan 07, 2014 10:21 AM ------      Message from: Simonne Maffucci B      Created: Mon Jan 07, 2014  3:57 AM       A,            Please let her know that this showed COPD and asthma            Thanks      B ------

## 2014-01-07 NOTE — Telephone Encounter (Signed)
Pt returned Ashley's call.

## 2014-01-07 NOTE — Telephone Encounter (Signed)
Pt called again, I relayed the results to her.  She seemed to not understand what this meant.  I explained that the breathing test showed evidence of her having come COPD and asthma, and briefly explained what copd and asthma are.  She still did not seem to know what this meant.  I advised her that Dr. Lake Bells would discuss this in further detail with her at her next visit.  Nothing further needed at this time.

## 2014-01-15 ENCOUNTER — Ambulatory Visit (INDEPENDENT_AMBULATORY_CARE_PROVIDER_SITE_OTHER): Payer: Medicare Other | Admitting: Family Medicine

## 2014-01-15 ENCOUNTER — Encounter: Payer: Self-pay | Admitting: Family Medicine

## 2014-01-15 VITALS — BP 134/80 | HR 88 | Temp 98.5°F | Ht 65.0 in | Wt 232.0 lb

## 2014-01-15 DIAGNOSIS — J189 Pneumonia, unspecified organism: Secondary | ICD-10-CM

## 2014-01-15 DIAGNOSIS — F17200 Nicotine dependence, unspecified, uncomplicated: Secondary | ICD-10-CM | POA: Diagnosis not present

## 2014-01-15 DIAGNOSIS — J441 Chronic obstructive pulmonary disease with (acute) exacerbation: Secondary | ICD-10-CM | POA: Diagnosis not present

## 2014-01-15 NOTE — Patient Instructions (Signed)
It was a pleasure to see you today.  I am glad to see you have continued to improve since your hospital discharge.   It is extremely important that you keep the appointment with Dr Lake Bells Pulmonology next [facility], May 13th at 1:30pm.

## 2014-01-16 NOTE — Assessment & Plan Note (Signed)
Has had no further tobacco use since discharge.

## 2014-01-16 NOTE — Assessment & Plan Note (Signed)
I discussed with patient this diagnosis and the objective measurement behind the 'label' of COPD (as she describes it).  She is not convinced of the utility of starting LABA/ICS therapy, or anticholinergic therapy.  Plans to discuss with her pulmonologist, Dr Lake Bells, at her visit with him next week.  She is given encouragement in her efforts to quit smoking.

## 2014-01-16 NOTE — Progress Notes (Signed)
   Subjective:    Patient ID: [name], female    DOB: [birth year], 63 y.o.   MRN: [id number]  HPI Patient presents by herself for hospital followup following hospitalization and intubation for respiratory failure/PNA.  She has had PFTs done since discharge, results reviewed by me. She remains doubtful about a diagnosis of asthma and COPD, stating that these are 'labels' and that she feels fine.  No smoking since April 4th, 2015.  Does not intend to return to smoking.  Has not obtained Spiriva due to high copay/cost to her. Is not convinced she needs to be on any medication.  Stopped using the supplemental oxygen at rest last Friday, May 1st.  Has an appointment with pulmonology (Dr Lake Bells) next [facility].   Denies fevers or chills, denies cough or sputum production> Has not had to use the rescue Albuterol HFA at all since discharge.  On ROS she remarks about a small amount of dependent edema in ankles at the end of the day; denies chest pain or pressure.     Review of Systems     Objective:   Physical Exam Well appearing; cane to ambulate. Fluid speech without dyspnea or tachypnea. No distress, not using supplemental oxygen (did not bring with her). HEENT neck supple, no cervical adenopathy. Clear oropharynx.  COR Regular S1S2 PULM Clear bilaterally, no rales or wheezes.  EXTS 2+ bilateral pitting ankle edema. Palpable dp pulses bilaterally.   Resting pulse oximetry 94-95%; ambulatory Pulse oximetry 88% on room air.        Assessment & Plan:

## 2014-01-16 NOTE — Assessment & Plan Note (Addendum)
No further cough or fevers; completed treatment for PNA.  Appears to be improving clinically. Given her desaturation when ambulating, she is advised to continue to use her home oxygen for the time being, reassess needs as she continues to improve. Discussed with patient.

## 2014-01-17 ENCOUNTER — Inpatient Hospital Stay: Payer: PRIVATE HEALTH INSURANCE | Admitting: Pulmonary Disease

## 2014-01-23 ENCOUNTER — Inpatient Hospital Stay: Payer: PRIVATE HEALTH INSURANCE | Admitting: Pulmonary Disease

## 2014-02-05 ENCOUNTER — Encounter: Payer: Self-pay | Admitting: Pulmonary Disease

## 2014-02-05 ENCOUNTER — Ambulatory Visit (INDEPENDENT_AMBULATORY_CARE_PROVIDER_SITE_OTHER)
Admission: RE | Admit: 2014-02-05 | Discharge: 2014-02-05 | Disposition: A | Discharge status: Home / Self Care | Payer: Medicare Other | Source: Ambulatory Visit | Attending: Pulmonary Disease | Admitting: Pulmonary Disease

## 2014-02-05 ENCOUNTER — Ambulatory Visit (INDEPENDENT_AMBULATORY_CARE_PROVIDER_SITE_OTHER): Payer: Medicare Other | Admitting: Pulmonary Disease

## 2014-02-05 ENCOUNTER — Encounter (INDEPENDENT_AMBULATORY_CARE_PROVIDER_SITE_OTHER): Payer: Self-pay

## 2014-02-05 VITALS — BP 126/68 | HR 85 | Ht 65.0 in | Wt 234.0 lb

## 2014-02-05 DIAGNOSIS — J441 Chronic obstructive pulmonary disease with (acute) exacerbation: Secondary | ICD-10-CM

## 2014-02-05 DIAGNOSIS — J189 Pneumonia, unspecified organism: Secondary | ICD-10-CM

## 2014-02-05 DIAGNOSIS — J449 Chronic obstructive pulmonary disease, unspecified: Secondary | ICD-10-CM | POA: Diagnosis not present

## 2014-02-05 NOTE — Patient Instructions (Signed)
We will have you get a chest x-ray on your way out today and call you with the results Stay active and exercise regularly We will see you back as needed

## 2014-02-05 NOTE — Assessment & Plan Note (Signed)
She was hospitalized for severe community acquired pneumonia as well as a severe exacerbation of COPD in April 2015. This required a brief course of mechanical ventilation.  Since her hospitalization she has had lung function testing which confirms airflow obstruction consistent with COPD. This is do to her prior heavy tobacco use.  Despite the fact that she was intubated for a few days and has lung function tests which confirms COPD, Ms. Franta refuses to believe that there is anything wrong with her lungs.  We discussed reasons to continue taking Spiriva but she has no desire to take any medication whatsoever. I tried my best to encourage her to take inhaled bronchodilators today but she refused.  Plan -encouraged exercise and weight loss -get a flu shot every year -f/u prn

## 2014-02-05 NOTE — Progress Notes (Signed)
Subjective:    Patient ID: [name], female    DOB: 1951-08-24, 63 y.o.   MRN: [id number]  HPI This is a 63 year old female who previously smoked 40-pack-years who comes to our clinic today for evaluation of COPD. She was recently hospitalized at [facility] in April 2015 for severe community acquired pneumonia and a severe exacerbation of COPD. She required mechanical ventilation for several days. She is extubated and discharged home on oxygen.  Since going home she says she does not use the oxygen at all. She's not taking any of the medications we have prescribed because she said the cost much. Specifically, she says that she's not taken Spriva. She said she took the samples of Spiriva but didn't make a difference.  She has not smoked cigarettes since leaving home.  She says that she's not able to exercise consistently because of chronic knee pain. However, with the exercise that she does perform she does not have shortness of breath. She does not have a cough. She feels "perfectly healthy".   Past Medical History  Diagnosis Date  . Psoriasis   . Macular degeneration      Family History  Problem Relation Age of Onset  . Osteoarthritis Mother   . Hypertension Mother   . Thyroid disease Mother      History   Social History  . Marital Status: Divorced    Spouse Name: N/A    Number of Children: N/A  . Years of Education: N/A   Occupational History  . Not on file.   Social History Main Topics  . Smoking status: Former Smoker -- 0.50 packs/day for 40 years    Types: Cigarettes    Quit date: 12/15/2013  . Smokeless tobacco: Never Used  . Alcohol Use: No  . Drug Use: Not on file  . Sexual Activity: Not on file   Other Topics Concern  . Not on file   Social History Narrative  . No narrative on file     No Known Allergies   Outpatient Prescriptions Prior to Visit  Medication Sig Dispense Refill  . albuterol (PROVENTIL HFA;VENTOLIN HFA) 108 (90 BASE)  MCG/ACT inhaler Inhale 2 puffs into the lungs every 6 (six) hours as needed for wheezing or shortness of breath.  1 Inhaler  2  . levofloxacin (LEVAQUIN) 750 MG tablet Take 1 tablet (750 mg total) by mouth daily.  3 tablet  0  . predniSONE (DELTASONE) 10 MG tablet Take 1 tablet (10 mg total) by mouth daily with breakfast.  3 tablet  0  . Spacer/Aero-Holding Chambers DEVI 1 Device by Does not apply route once.  1 each  0  . tiotropium (SPIRIVA) 18 MCG inhalation capsule Place 1 capsule (18 mcg total) into inhaler and inhale daily.  30 capsule  12   No facility-administered medications prior to visit.      Review of Systems  Constitutional: Negative for fever, chills, diaphoresis, appetite change and fatigue.  HENT: Negative for congestion, hearing loss, nosebleeds, postnasal drip, rhinorrhea, sinus pressure, sore throat and trouble swallowing.   Eyes: Negative for discharge, redness and visual disturbance.  Respiratory: Negative for cough, choking, chest tightness, shortness of breath and wheezing.   Cardiovascular: Negative for chest pain and leg swelling.  Gastrointestinal: Negative for nausea, abdominal pain, diarrhea, constipation and blood in stool.  Genitourinary: Negative for dysuria, frequency and hematuria.  Musculoskeletal: Negative for arthralgias, joint swelling, myalgias and neck stiffness.  Skin: Negative for color change, pallor and rash.  Neurological: Negative for dizziness, seizures, speech difficulty, light-headedness, numbness and headaches.  Hematological: Negative for adenopathy. Does not bruise/bleed easily.       Objective:   Physical Exam Filed Vitals:   02/05/14 1400  BP: 126/68  Pulse: 85  Height: 5\' 5"  (1.651 m)  Weight: 234 lb (106.142 kg)  SpO2: 93%   RA  Gen: obese, no acute distress HEENT: NCAT, PERRL, EOMi, OP clear, neck supple without masses PULM: CTA B CV: RRR, no mgr, no JVD AB: BS+, soft, nontender, no hsm Ext: warm, chronic appearing  leg edema, no clubbing, no cyanosis Derm: no rash or skin breakdown Neuro: A&Ox4, CN II-XII intact, strength 5/5 in all 4 extremities   12/2013 CXR > RML pneumonia 12/2013 PFT> Ratio 46%, FEV1  1.24L (54% pred, 44% change), TLC 6.17L (127% pred), DLCO 10.82 (48% pred),        Assessment & Plan:   COPD exacerbation She was hospitalized for severe community acquired pneumonia as well as a severe exacerbation of COPD in April 2015. This required a brief course of mechanical ventilation.  Since her hospitalization she has had lung function testing which confirms airflow obstruction consistent with COPD. This is do to her prior heavy tobacco use.  Despite the fact that she was intubated for a few days and has lung function tests which confirms COPD, [name] refuses to believe that there is anything wrong with her lungs.  We discussed reasons to continue taking Spiriva but she has no desire to take any medication whatsoever. I tried my best to encourage her to take inhaled bronchodilators today but she refused.  Plan -encouraged exercise and weight loss -get a flu shot every year -f/u prn  CAP (community acquired pneumonia) We will order a CXR to ensure radiographic resolution of her CAP.    Updated Medication List Outpatient Encounter Prescriptions as of 02/05/2014  Medication Sig  . albuterol (PROVENTIL HFA;VENTOLIN HFA) 108 (90 BASE) MCG/ACT inhaler Inhale 2 puffs into the lungs every 6 (six) hours as needed for wheezing or shortness of breath.  . Multiple Vitamin (MULTIVITAMIN WITH MINERALS) TABS tablet Take 1 tablet by mouth daily.  . naproxen sodium (ANAPROX) 220 MG tablet Take 220 mg by mouth 2 (two) times daily as needed.  . [DISCONTINUED] levofloxacin (LEVAQUIN) 750 MG tablet Take 1 tablet (750 mg total) by mouth daily.  . [DISCONTINUED] predniSONE (DELTASONE) 10 MG tablet Take 1 tablet (10 mg total) by mouth daily with breakfast.  . [DISCONTINUED] Spacer/Aero-Holding  Josiah Lobo DEVI 1 Device by Does not apply route once.  . [DISCONTINUED] tiotropium (SPIRIVA) 18 MCG inhalation capsule Place 1 capsule (18 mcg total) into inhaler and inhale daily.

## 2014-02-05 NOTE — Assessment & Plan Note (Signed)
We will order a CXR to ensure radiographic resolution of her CAP.

## 2014-02-06 NOTE — Progress Notes (Signed)
Quick Note:  Spoke with pt, she is aware of results and recs. Nothing further needed. ______ 

## 2014-05-01 ENCOUNTER — Ambulatory Visit (INDEPENDENT_AMBULATORY_CARE_PROVIDER_SITE_OTHER): Payer: Medicare Other | Admitting: Family Medicine

## 2014-05-01 ENCOUNTER — Encounter: Payer: Self-pay | Admitting: Family Medicine

## 2014-05-01 VITALS — BP 123/80 | HR 101 | Temp 98.4°F | Wt 235.0 lb

## 2014-05-01 DIAGNOSIS — H5789 Other specified disorders of eye and adnexa: Secondary | ICD-10-CM | POA: Diagnosis not present

## 2014-05-01 MED ORDER — NAPROXEN SODIUM 220 MG PO TABS: 220.0000 mg | ORAL_TABLET | Freq: Two times a day (BID) | ORAL | Status: DC | PRN

## 2014-05-01 MED ORDER — CLINDAMYCIN HCL 300 MG PO CAPS: 300.0000 mg | ORAL_CAPSULE | Freq: Three times a day (TID) | ORAL | Status: AC

## 2014-05-01 NOTE — Assessment & Plan Note (Signed)
Appears to be a local reaction to a bug bite. Mild concern for preseptal cellulitis and will treat with clindamycin for 7 days since it has gotten worse. Will refill naproxen for pain and inflammation. Do not suspect orbital cellulitis since no pain with EOM, no proptosis and no vision loss. Red flags discussed verbally, including return for decreased vision, extraocular movement pain, fevers. Patient to follow-up in one week if not improving.

## 2014-05-01 NOTE — Patient Instructions (Signed)
[name], it was a pleasure seeing you today. Today we talked about your eye swelling. I will prescribe you some antibiotics and an antiinflammatory medication. Please return to be seen by a doctor if your eye does not improve in the next week.  If you have any questions or concerns, please do not hesitate to call the office at [phone].  Sincerely,  Cordelia Poche, MD

## 2014-05-01 NOTE — Progress Notes (Signed)
   Subjective:    Patient ID: [name], female    DOB: December 29, 1950, 63 y.o.   MRN: [id number]  HPI  Patient presents for same day appointment.  Eye swelling (left): started 5 ago. It is worsened. She believes she may have been bitten by an insect. There is erythema and tenderness. There are no vision changes. It does not hurt to move her eye. No discharge. She has tried some eye wash with no help in symptoms. No fever, chills. nausea or vomiting. No pain with eye movements.  History  Substance Use Topics  . Smoking status: Former Smoker -- 0.50 packs/day for 40 years    Types: Cigarettes    Quit date: 12/15/2013  . Smokeless tobacco: Never Used  . Alcohol Use: No    Review of Systems Per HPI    Objective:   Physical Exam  General: well appearing female HEENT: swelling over left brow and left cheek. Mild tenderness and erythema over brow with just erythema over cheek. There is an area of the left brow with some crusting that looks like something may have bitten into the skin. EOMI. PERRL. No proptosis.  Visual acuity Left: 20/30 Right: 20/25 Bilateral: 20/20     Assessment & Plan:

## 2014-06-20 LAB — PULMONARY FUNCTION TEST
DL/VA % pred: 53 %
DL/VA: 2.46 ml/min/mmHg/L
DLCO unc % pred: 48 %
DLCO unc: 10.82 ml/min/mmHg
FEF 25-75 Post: 0.78 L/sec
FEF 25-75 Pre: 0.36 L/sec
FEF2575-%Change-Post: 116 %
FEF2575-%Pred-Post: 38 %
FEF2575-%Pred-Pre: 17 %
FEV1-%Change-Post: 44 %
FEV1-%Pred-Post: 54 %
FEV1-%Pred-Pre: 37 %
FEV1-Post: 1.24 L
FEV1-Pre: 0.85 L
FEV1FVC-%Change-Post: 9 %
FEV1FVC-%Pred-Pre: 54 %
FEV6-%Change-Post: 32 %
FEV6-%Pred-Post: 89 %
FEV6-%Pred-Pre: 67 %
FEV6-Post: 2.56 L
FEV6-Pre: 1.93 L
FEV6FVC-%Change-Post: 0 %
FEV6FVC-%Pred-Post: 98 %
FEV6FVC-%Pred-Pre: 97 %
FVC-%Change-Post: 31 %
FVC-%Pred-Post: 91 %
FVC-%Pred-Pre: 69 %
FVC-Post: 2.71 L
FVC-Pre: 2.05 L
Post FEV1/FVC ratio: 46 %
Post FEV6/FVC ratio: 95 %
Pre FEV1/FVC ratio: 42 %
Pre FEV6/FVC Ratio: 94 %
RV % pred: 195 %
RV: 3.95 L
TLC % pred: 127 %
TLC: 6.17 L

## 2015-04-18 IMAGING — CR DG ABD PORTABLE 1V
1 series · 1 of 1 positions shown · non-contrast
Comparison: Portable exam 8822 hr without priors available for
comparison

CLINICAL DATA: Nasogastric tube placement

EXAM:
PORTABLE ABDOMEN - 1 VIEW

[AP]
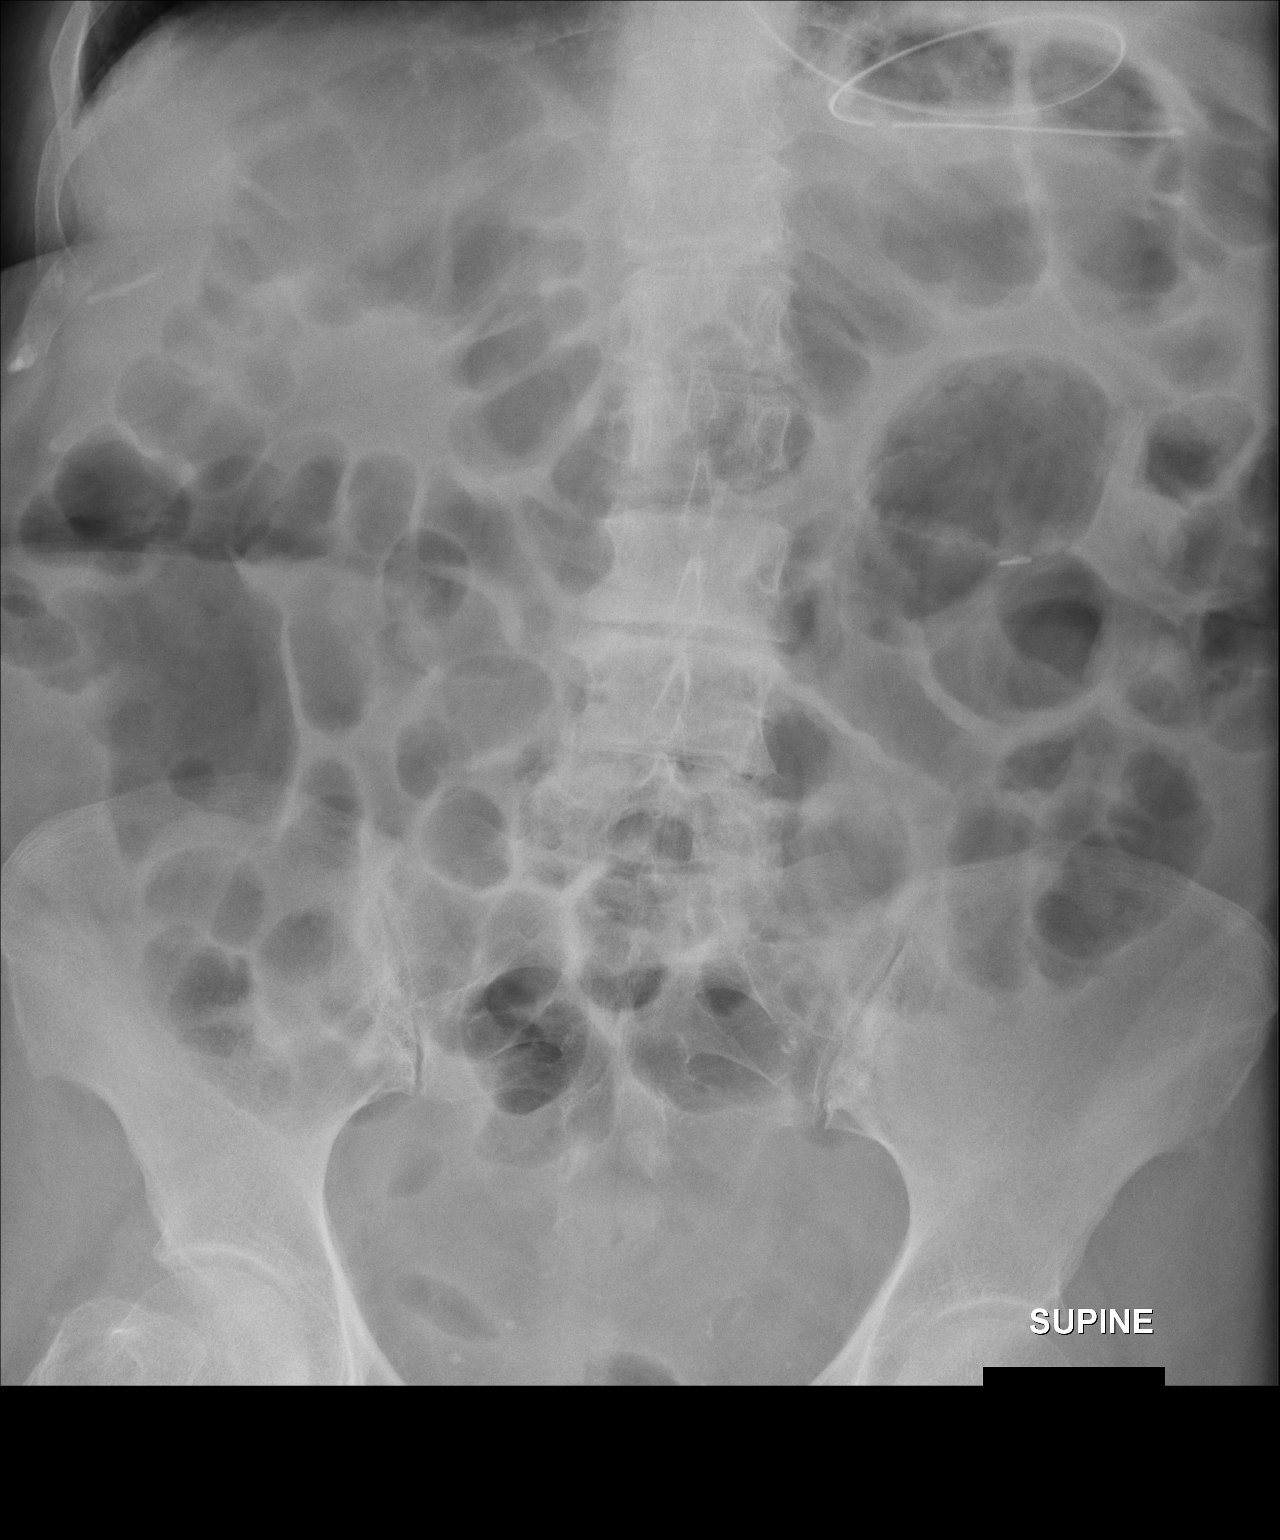

[1 of 1 positions shown; findings below may reference images not displayed]

FINDINGS: Nasogastric tube coiled in proximal stomach.

Air-filled nondistended loops of large and small bowel throughout
abdomen.

Small amount a gas within rectum.

No bowel wall thickening or dilatation.

Bones appear demineralized.
IMPRESSION: Nasogastric tube coiled in proximal stomach.

## 2015-04-20 IMAGING — CR DG CHEST 1V PORT
1 series · 1 of 1 positions shown · non-contrast
Comparison: December 25, 2013

CLINICAL DATA: Respiratory failure; hypoxia

EXAM:
PORTABLE CHEST - 1 VIEW

[AP]
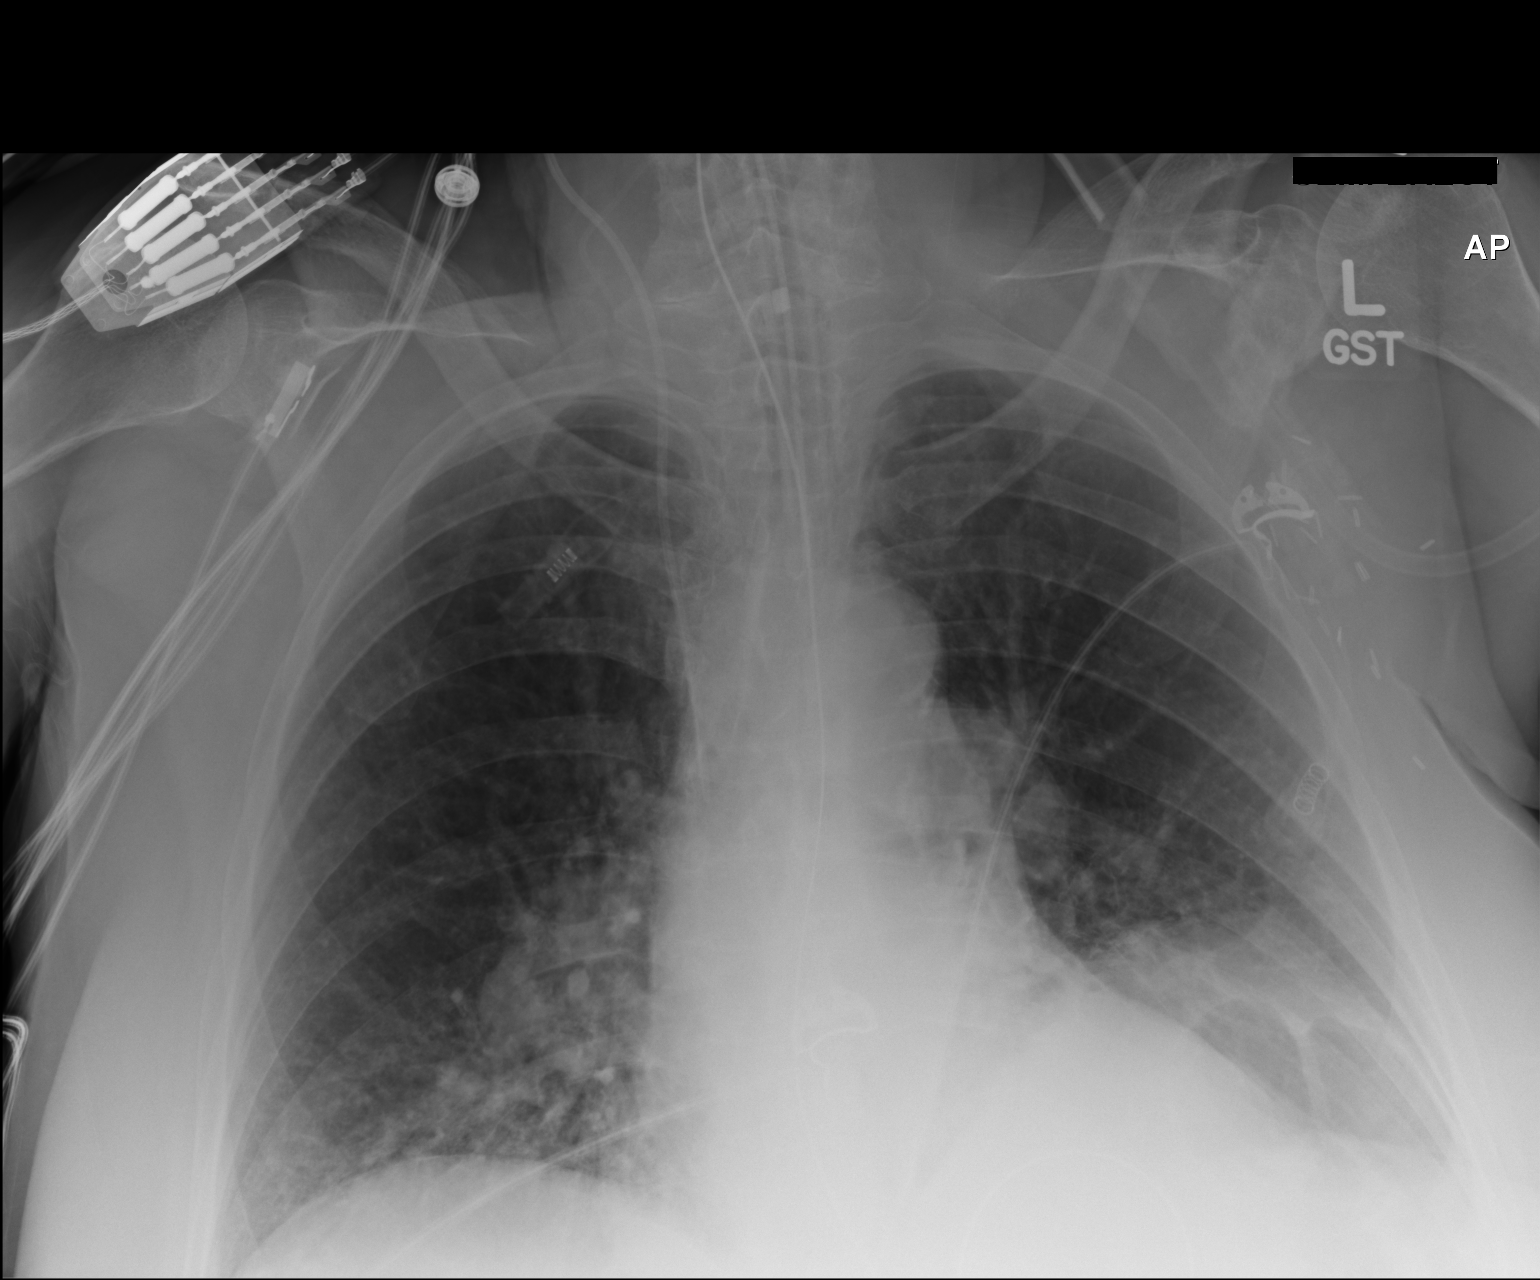

[1 of 1 positions shown; findings below may reference images not displayed]

FINDINGS: Endotracheal tube tip is 5.2 cm above the carinal. Central catheter
tip is in the superior vena cava near the cavoatrial junction.
Nasogastric tube tip and side port are below the diaphragm. No
pneumothorax.

There is underlying emphysema. There is mild elevation of the left
hemidiaphragm. There is bibasilar interstitial edema. There is no
appreciable airspace consolidation.

Heart is mildly enlarged. The pulmonary vascularity is within normal
limits. No adenopathy. There are surgical clips in the left axillary
region.
IMPRESSION: Tube and catheter positions as described without pneumothorax.
Evidence of a degree of congestive heart failure superimposed on
emphysematous change. No airspace consolidation.

## 2015-07-19 ENCOUNTER — Inpatient Hospital Stay (HOSPITAL_COMMUNITY)
Admission: EM | Admit: 2015-07-19 | Discharge: 2015-07-24 | DRG: 189 | Disposition: A | Discharge status: Home / Self Care | Payer: Medicare Other | Attending: Family Medicine | Admitting: Family Medicine

## 2015-07-19 ENCOUNTER — Encounter (HOSPITAL_COMMUNITY): Payer: Self-pay

## 2015-07-19 ENCOUNTER — Emergency Department (HOSPITAL_COMMUNITY): Payer: Medicare Other

## 2015-07-19 ENCOUNTER — Inpatient Hospital Stay (HOSPITAL_COMMUNITY): Payer: Medicare Other

## 2015-07-19 DIAGNOSIS — Z853 Personal history of malignant neoplasm of breast: Secondary | ICD-10-CM | POA: Diagnosis not present

## 2015-07-19 DIAGNOSIS — I10 Essential (primary) hypertension: Secondary | ICD-10-CM | POA: Diagnosis present

## 2015-07-19 DIAGNOSIS — E872 Acidosis: Secondary | ICD-10-CM | POA: Diagnosis present

## 2015-07-19 DIAGNOSIS — J441 Chronic obstructive pulmonary disease with (acute) exacerbation: Secondary | ICD-10-CM | POA: Diagnosis present

## 2015-07-19 DIAGNOSIS — Z8249 Family history of ischemic heart disease and other diseases of the circulatory system: Secondary | ICD-10-CM

## 2015-07-19 DIAGNOSIS — D751 Secondary polycythemia: Secondary | ICD-10-CM | POA: Diagnosis present

## 2015-07-19 DIAGNOSIS — Z9981 Dependence on supplemental oxygen: Secondary | ICD-10-CM

## 2015-07-19 DIAGNOSIS — Z8701 Personal history of pneumonia (recurrent): Secondary | ICD-10-CM

## 2015-07-19 DIAGNOSIS — J209 Acute bronchitis, unspecified: Secondary | ICD-10-CM | POA: Diagnosis present

## 2015-07-19 DIAGNOSIS — Z79899 Other long term (current) drug therapy: Secondary | ICD-10-CM

## 2015-07-19 DIAGNOSIS — J449 Chronic obstructive pulmonary disease, unspecified: Secondary | ICD-10-CM | POA: Diagnosis present

## 2015-07-19 DIAGNOSIS — J9622 Acute and chronic respiratory failure with hypercapnia: Secondary | ICD-10-CM | POA: Diagnosis not present

## 2015-07-19 DIAGNOSIS — Z96652 Presence of left artificial knee joint: Secondary | ICD-10-CM | POA: Diagnosis present

## 2015-07-19 DIAGNOSIS — Z87891 Personal history of nicotine dependence: Secondary | ICD-10-CM | POA: Diagnosis not present

## 2015-07-19 DIAGNOSIS — M1990 Unspecified osteoarthritis, unspecified site: Secondary | ICD-10-CM | POA: Diagnosis present

## 2015-07-19 DIAGNOSIS — J9621 Acute and chronic respiratory failure with hypoxia: Secondary | ICD-10-CM

## 2015-07-19 DIAGNOSIS — I110 Hypertensive heart disease with heart failure: Secondary | ICD-10-CM | POA: Diagnosis present

## 2015-07-19 DIAGNOSIS — I5032 Chronic diastolic (congestive) heart failure: Secondary | ICD-10-CM | POA: Diagnosis present

## 2015-07-19 DIAGNOSIS — H3530 Unspecified macular degeneration: Secondary | ICD-10-CM | POA: Diagnosis present

## 2015-07-19 DIAGNOSIS — Z9114 Patient's other noncompliance with medication regimen: Secondary | ICD-10-CM | POA: Diagnosis not present

## 2015-07-19 DIAGNOSIS — R0602 Shortness of breath: Secondary | ICD-10-CM | POA: Diagnosis not present

## 2015-07-19 DIAGNOSIS — J9600 Acute respiratory failure, unspecified whether with hypoxia or hypercapnia: Secondary | ICD-10-CM

## 2015-07-19 DIAGNOSIS — I5030 Unspecified diastolic (congestive) heart failure: Secondary | ICD-10-CM | POA: Diagnosis not present

## 2015-07-19 DIAGNOSIS — Z91148 Patient's other noncompliance with medication regimen for other reason: Secondary | ICD-10-CM | POA: Insufficient documentation

## 2015-07-19 DIAGNOSIS — J9620 Acute and chronic respiratory failure, unspecified whether with hypoxia or hypercapnia: Secondary | ICD-10-CM | POA: Diagnosis present

## 2015-07-19 DIAGNOSIS — Z9884 Bariatric surgery status: Secondary | ICD-10-CM | POA: Diagnosis not present

## 2015-07-19 DIAGNOSIS — Z781 Physical restraint status: Secondary | ICD-10-CM

## 2015-07-19 DIAGNOSIS — R0902 Hypoxemia: Secondary | ICD-10-CM | POA: Diagnosis not present

## 2015-07-19 DIAGNOSIS — R0600 Dyspnea, unspecified: Secondary | ICD-10-CM

## 2015-07-19 HISTORY — DX: Hypocalcemia: E83.51

## 2015-07-19 HISTORY — DX: Localized edema: R60.0

## 2015-07-19 HISTORY — DX: Allergic rhinitis, unspecified: J30.9

## 2015-07-19 HISTORY — DX: Other disorders of plasma-protein metabolism, not elsewhere classified: E88.09

## 2015-07-19 HISTORY — DX: Other fatigue: R53.83

## 2015-07-19 HISTORY — DX: Chronic obstructive pulmonary disease, unspecified: J44.9

## 2015-07-19 HISTORY — DX: Essential (primary) hypertension: I10

## 2015-07-19 HISTORY — DX: Tobacco use: Z72.0

## 2015-07-19 HISTORY — DX: Abnormal weight gain: R63.5

## 2015-07-19 HISTORY — DX: Acute respiratory failure, unspecified whether with hypoxia or hypercapnia: J96.00

## 2015-07-19 HISTORY — DX: Anemia, unspecified: D64.9

## 2015-07-19 HISTORY — DX: Pneumonia, unspecified organism: J18.9

## 2015-07-19 LAB — CBC WITH DIFFERENTIAL/PLATELET
Basophils Absolute: 0 10*3/uL (ref 0.0–0.1)
Basophils Relative: 0 %
Eosinophils Absolute: 0 10*3/uL (ref 0.0–0.7)
Eosinophils Relative: 0 %
HCT: 36.4 % (ref 36.0–46.0)
Hemoglobin: 9.9 g/dL — ABNORMAL LOW (ref 12.0–15.0)
Lymphocytes Relative: 12 %
Lymphs Abs: 1.5 10*3/uL (ref 0.7–4.0)
MCH: 24 pg — ABNORMAL LOW (ref 26.0–34.0)
MCHC: 27.2 g/dL — ABNORMAL LOW (ref 30.0–36.0)
MCV: 88.1 fL (ref 78.0–100.0)
Monocytes Absolute: 1.4 10*3/uL — ABNORMAL HIGH (ref 0.1–1.0)
Monocytes Relative: 12 %
Neutro Abs: 9.1 10*3/uL — ABNORMAL HIGH (ref 1.7–7.7)
Neutrophils Relative %: 76 %
Platelets: 195 10*3/uL (ref 150–400)
RBC: 4.13 MIL/uL (ref 3.87–5.11)
RDW: 17.5 % — ABNORMAL HIGH (ref 11.5–15.5)
WBC: 12 10*3/uL — ABNORMAL HIGH (ref 4.0–10.5)

## 2015-07-19 LAB — BASIC METABOLIC PANEL
Anion gap: 7 (ref 5–15)
BUN: 31 mg/dL — ABNORMAL HIGH (ref 6–20)
CO2: 34 mmol/L — ABNORMAL HIGH (ref 22–32)
Calcium: 8.1 mg/dL — ABNORMAL LOW (ref 8.9–10.3)
Chloride: 103 mmol/L (ref 101–111)
Creatinine, Ser: 0.67 mg/dL (ref 0.44–1.00)
GFR calc Af Amer: >60 mL/min (ref >60)
GFR calc non Af Amer: >60 mL/min (ref >60)
Glucose, Bld: 116 mg/dL — ABNORMAL HIGH (ref 65–99)
Potassium: 4.1 mmol/L (ref 3.5–5.1)
Sodium: 144 mmol/L (ref 135–145)

## 2015-07-19 LAB — BRAIN NATRIURETIC PEPTIDE: B Natriuretic Peptide: 831.2 pg/mL — ABNORMAL HIGH (ref 0.0–100.0)

## 2015-07-19 LAB — I-STAT CHEM 8, ED
BUN: 30 mg/dL — ABNORMAL HIGH (ref 6–20)
Calcium, Ion: 1.04 mmol/L — ABNORMAL LOW (ref 1.13–1.30)
Chloride: 98 mmol/L — ABNORMAL LOW (ref 101–111)
Creatinine, Ser: 0.8 mg/dL (ref 0.44–1.00)
Glucose, Bld: 119 mg/dL — ABNORMAL HIGH (ref 65–99)
HCT: 36 % (ref 36.0–46.0)
Hemoglobin: 12.2 g/dL (ref 12.0–15.0)
Potassium: 3.9 mmol/L (ref 3.5–5.1)
Sodium: 142 mmol/L (ref 135–145)
TCO2: 33 mmol/L (ref 0–100)

## 2015-07-19 LAB — CBC
HCT: 48.5 % — ABNORMAL HIGH (ref 36.0–46.0)
Hemoglobin: 16.8 g/dL — ABNORMAL HIGH (ref 12.0–15.0)
MCH: 30.9 pg (ref 26.0–34.0)
MCHC: 34.6 g/dL (ref 30.0–36.0)
MCV: 89.3 fL (ref 78.0–100.0)
Platelets: 229 10*3/uL (ref 150–400)
RBC: 5.43 MIL/uL — ABNORMAL HIGH (ref 3.87–5.11)
RDW: 12.9 % (ref 11.5–15.5)
WBC: 8.1 10*3/uL (ref 4.0–10.5)

## 2015-07-19 LAB — BLOOD GAS, ARTERIAL
Acid-Base Excess: 6.2 mmol/L — ABNORMAL HIGH (ref 0.0–2.0)
Acid-Base Excess: 5.8 mmol/L — ABNORMAL HIGH (ref 0.0–2.0)
Acid-Base Excess: 7 mmol/L — ABNORMAL HIGH (ref 0.0–2.0)
Acid-Base Excess: 9.9 mmol/L — ABNORMAL HIGH (ref 0.0–2.0)
Bicarbonate: 33.7 mEq/L — ABNORMAL HIGH (ref 20.0–24.0)
Bicarbonate: 33.9 mEq/L — ABNORMAL HIGH (ref 20.0–24.0)
Bicarbonate: 34.3 mEq/L — ABNORMAL HIGH (ref 20.0–24.0)
Bicarbonate: 36.4 mEq/L — ABNORMAL HIGH (ref 20.0–24.0)
Delivery systems: POSITIVE
Delivery systems: POSITIVE
Drawn by: 235321
Drawn by: 244801
Drawn by: 244801
Drawn by: 36277
Expiratory PAP: 7
Expiratory PAP: 7
FIO2: 0.4
FIO2: 0.4
Inspiratory PAP: 14
Inspiratory PAP: 14
O2 Content: 4 L/min
O2 Content: 4 L/min
O2 Saturation: 91.5 %
O2 Saturation: 94.9 %
O2 Saturation: 92.2 %
O2 Saturation: 90.2 %
Patient temperature: 98.6
Patient temperature: 98.6
Patient temperature: 98.6
Patient temperature: 98.6
TCO2: 32 mmol/L (ref 0–100)
TCO2: 36.8 mmol/L (ref 0–100)
TCO2: 36.9 mmol/L (ref 0–100)
TCO2: 38.7 mmol/L (ref 0–100)
pCO2 arterial: 70.6 mmHg (ref 35.0–45.0)
pCO2 arterial: 95.6 mmHg (ref 35.0–45.0)
pCO2 arterial: 85 mmHg (ref 35.0–45.0)
pCO2 arterial: 76.2 mmHg (ref 35.0–45.0)
pH, Arterial: 7.3 — ABNORMAL LOW (ref 7.350–7.450)
pH, Arterial: 7.175 — CL (ref 7.350–7.450)
pH, Arterial: 7.23 — ABNORMAL LOW (ref 7.350–7.450)
pH, Arterial: 7.301 — ABNORMAL LOW (ref 7.350–7.450)
pO2, Arterial: 75.5 mmHg — ABNORMAL LOW (ref 80.0–100.0)
pO2, Arterial: 99.8 mmHg (ref 80.0–100.0)
pO2, Arterial: 79.2 mmHg — ABNORMAL LOW (ref 80.0–100.0)
pO2, Arterial: 65.1 mmHg — ABNORMAL LOW (ref 80.0–100.0)

## 2015-07-19 LAB — TROPONIN I
Troponin I: <0.03 ng/mL (ref <0.031)
Troponin I: 0.03 ng/mL (ref <0.031)
Troponin I: 0.03 ng/mL (ref <0.031)

## 2015-07-19 LAB — TYPE AND SCREEN
ABO/RH(D): A NEG
Antibody Screen: NEGATIVE

## 2015-07-19 LAB — ABO/RH: ABO/RH(D): A NEG

## 2015-07-19 LAB — MRSA PCR SCREENING: MRSA by PCR: NEGATIVE

## 2015-07-19 LAB — I-STAT TROPONIN, ED: Troponin i, poc: 0.03 ng/mL (ref 0.00–0.08)

## 2015-07-19 LAB — POC OCCULT BLOOD, ED: Fecal Occult Bld: NEGATIVE

## 2015-07-19 MED ORDER — METHYLPREDNISOLONE SODIUM SUCC 125 MG IJ SOLR
60.0000 mg | Freq: Two times a day (BID) | INTRAMUSCULAR | Status: DC
  Filled 2015-07-19 (×2): qty 2

## 2015-07-19 MED ORDER — METHYLPREDNISOLONE SODIUM SUCC 125 MG IJ SOLR
125.0000 mg | Freq: Once | INTRAMUSCULAR | Status: AC
  Administered 2015-07-19: 125 mg via INTRAVENOUS
  Filled 2015-07-19: qty 2

## 2015-07-19 MED ORDER — ALBUTEROL SULFATE (2.5 MG/3ML) 0.083% IN NEBU: 2.5000 mg | INHALATION_SOLUTION | RESPIRATORY_TRACT | Status: DC | PRN

## 2015-07-19 MED ORDER — ENOXAPARIN SODIUM 40 MG/0.4ML ~~LOC~~ SOLN
40.0000 mg | SUBCUTANEOUS | Status: DC
  Administered 2015-07-22 – 2015-07-23 (×2): 40 mg via SUBCUTANEOUS
  Filled 2015-07-19 (×6): qty 0.4

## 2015-07-19 MED ORDER — ALBUTEROL SULFATE (2.5 MG/3ML) 0.083% IN NEBU
5.0000 mg | INHALATION_SOLUTION | Freq: Once | RESPIRATORY_TRACT | Status: DC
  Filled 2015-07-19: qty 6

## 2015-07-19 MED ORDER — IPRATROPIUM-ALBUTEROL 0.5-2.5 (3) MG/3ML IN SOLN
3.0000 mL | RESPIRATORY_TRACT | Status: DC
  Administered 2015-07-19: 3 mL via RESPIRATORY_TRACT
  Filled 2015-07-19: qty 3

## 2015-07-19 MED ORDER — DEXTROSE 5 % IV SOLN
1.0000 g | Freq: Once | INTRAVENOUS | Status: AC
  Administered 2015-07-19: 1 g via INTRAVENOUS
  Filled 2015-07-19: qty 10

## 2015-07-19 MED ORDER — SODIUM CHLORIDE 0.9 % IJ SOLN
3.0000 mL | Freq: Two times a day (BID) | INTRAMUSCULAR | Status: DC
  Administered 2015-07-19 – 2015-07-24 (×10): 3 mL via INTRAVENOUS

## 2015-07-19 MED ORDER — METHYLPREDNISOLONE SODIUM SUCC 125 MG IJ SOLR
60.0000 mg | Freq: Two times a day (BID) | INTRAMUSCULAR | Status: DC
  Administered 2015-07-20 (×3): 60 mg via INTRAVENOUS
  Filled 2015-07-19: qty 2
  Filled 2015-07-19 (×4): qty 0.96

## 2015-07-19 MED ORDER — DOXYCYCLINE HYCLATE 100 MG PO TABS: 100.0000 mg | ORAL_TABLET | Freq: Two times a day (BID) | ORAL | Status: DC

## 2015-07-19 MED ORDER — PREDNISONE 50 MG PO TABS: 50.0000 mg | ORAL_TABLET | Freq: Every day | ORAL | Status: DC

## 2015-07-19 MED ORDER — CETYLPYRIDINIUM CHLORIDE 0.05 % MT LIQD
7.0000 mL | Freq: Two times a day (BID) | OROMUCOSAL | Status: DC
  Administered 2015-07-19 – 2015-07-22 (×4): 7 mL via OROMUCOSAL

## 2015-07-19 MED ORDER — LEVOFLOXACIN IN D5W 750 MG/150ML IV SOLN
750.0000 mg | INTRAVENOUS | Status: DC
  Administered 2015-07-19 – 2015-07-20 (×2): 750 mg via INTRAVENOUS
  Filled 2015-07-19 (×3): qty 150

## 2015-07-19 MED ORDER — ALBUTEROL SULFATE (2.5 MG/3ML) 0.083% IN NEBU
5.0000 mg | INHALATION_SOLUTION | Freq: Once | RESPIRATORY_TRACT | Status: AC
  Administered 2015-07-19: 5 mg via RESPIRATORY_TRACT
  Filled 2015-07-19: qty 6

## 2015-07-19 MED ORDER — IOHEXOL 350 MG/ML SOLN
100.0000 mL | Freq: Once | INTRAVENOUS | Status: AC | PRN
  Administered 2015-07-19: 100 mL via INTRAVENOUS

## 2015-07-19 MED ORDER — IPRATROPIUM-ALBUTEROL 0.5-2.5 (3) MG/3ML IN SOLN
3.0000 mL | Freq: Four times a day (QID) | RESPIRATORY_TRACT | Status: DC
  Administered 2015-07-19 – 2015-07-21 (×7): 3 mL via RESPIRATORY_TRACT
  Filled 2015-07-19 (×8): qty 3

## 2015-07-19 MED ORDER — PANTOPRAZOLE SODIUM 40 MG IV SOLR
40.0000 mg | INTRAVENOUS | Status: DC
  Administered 2015-07-19 – 2015-07-21 (×3): 40 mg via INTRAVENOUS
  Filled 2015-07-19 (×4): qty 40

## 2015-07-19 MED ORDER — FUROSEMIDE 10 MG/ML IJ SOLN
40.0000 mg | Freq: Once | INTRAMUSCULAR | Status: AC
  Administered 2015-07-19: 40 mg via INTRAVENOUS
  Filled 2015-07-19: qty 4

## 2015-07-19 MED ORDER — ACETAMINOPHEN 325 MG PO TABS
650.0000 mg | ORAL_TABLET | Freq: Four times a day (QID) | ORAL | Status: DC | PRN
  Filled 2015-07-19: qty 2

## 2015-07-19 MED ORDER — POLYETHYLENE GLYCOL 3350 17 G PO PACK: 17.0000 g | PACK | Freq: Every day | ORAL | Status: DC | PRN

## 2015-07-19 MED ORDER — ACETAMINOPHEN 650 MG RE SUPP: 650.0000 mg | Freq: Four times a day (QID) | RECTAL | Status: DC | PRN

## 2015-07-19 NOTE — Plan of Care (Signed)
Problem: Education: Goal: Knowledge of disease or condition will improve Outcome: Not Progressing Reviewed COPD and use of bi pap

## 2015-07-19 NOTE — Progress Notes (Signed)
Pt very sleepy and hard to arouse. ABG and Bipap ordered. Consuelo Pandy RN

## 2015-07-19 NOTE — ED Notes (Signed)
Pt complains of being short of breath and having a low O2 sat at home, she states that she's been admitted for the same before

## 2015-07-19 NOTE — Consult Note (Signed)
PULMONARY / CRITICAL CARE MEDICINE   Name: [name] MRN: [id number] DOB: 06-Feb-1951    ADMISSION DATE:  07/19/2015 CONSULTATION DATE:  11/5  REFERRING MD :  FPTS  CHIEF COMPLAINT:  Respiratory failure, AMS   INITIAL PRESENTATION: 64yo female former smoker with hx COPD, HTN, HFpEF, previous hx PNA with respiratory failure requiring intubation 2015.  Presented 11/5 with progressive SOB over last 2 weeks, acutely worse in last 24 hours, as well as cough with yellow sputum.  Admitted by FPTS to SDU and was initially improved but mid-day 11/5 became increasingly lethargic.  ABG revealed significant hypercarbic respiratory failure with PCO2 >90 and pH 7.1.  Placed on bipap and PCCM consulted.   STUDIES:  CTA chest 11/5>>> no PE, mild edema, probable pulm HTN  SIGNIFICANT EVENTS:    HISTORY OF PRESENT ILLNESS:  64yo female former smoker with hx COPD, HTN, HFpEF, previous hx PNA with respiratory failure requiring intubation 2015.  Presented 11/5 with progressive SOB over last 2 weeks, acutely worse in last 24 hours, as well as cough with yellow sputum.  Admitted by FPTS to SDU and was initially improved but mid-day 11/5 became increasingly lethargic.  ABG revealed significant hypercarbic respiratory failure with PCO2 >90 and pH 7.1.  Placed on bipap and PCCM consulted.   Per nurse, pt felt this all began approx 2 weeks ago when she kept a family members dog.  Cough has been progressive and productive of yellow sputum intermittently. Also c/o abd fullness.  Told nurse she felt like she needed to "pop" her abd with a needle. Denied chest pain, hemoptysis, abd pain, n/v/d, fever per nurse.  Quit smoking approx 1 year ago. Wears 2L O2 at home.   PAST MEDICAL HISTORY :   has a past medical history of Psoriasis; Macular degeneration; Abnormal weight gain (08/14/2009); Tobacco abuse; Allergic rhinitis; Hypertension; Cancer ([place]); Arthritis; Anemia; Hypocalcemia; Edema extremities; Fatigue;  Hypoalbuminemia; Pneumonia; CAP (community acquired pneumonia); COPD (chronic obstructive pulmonary disease) ([place]); and Acute respiratory failure ([place]).  has past surgical history that includes left knee replacement (1995); Gastric bypass (May 2004); and Breast surgery. Prior to Admission medications   Medication Sig Start Date End Date Taking? Authorizing Provider  Multiple Vitamin (MULTIVITAMIN WITH MINERALS) TABS tablet Take 1 tablet by mouth daily.   Yes Historical Provider, MD  albuterol (PROVENTIL HFA;VENTOLIN HFA) 108 (90 BASE) MCG/ACT inhaler Inhale 2 puffs into the lungs every 6 (six) hours as needed for wheezing or shortness of breath. Patient not taking: Reported on 07/19/2015 12/31/13   Olam Idler, MD  naproxen sodium (ANAPROX) 220 MG tablet Take 1 tablet (220 mg total) by mouth 2 (two) times daily as needed. Patient not taking: Reported on 07/19/2015 05/01/14   Mariel Aloe, MD   No Known Allergies  FAMILY HISTORY:  has no family status information on file.  SOCIAL HISTORY:  reports that she quit smoking about 19 months ago. Her smoking use included Cigarettes. She has a 20 pack-year smoking history. She has never used smokeless tobacco. She reports that she does not drink alcohol.  REVIEW OF SYSTEMS:  Unable - obtained from records and bedside RN.   SUBJECTIVE:   VITAL SIGNS: Temp:  [97.6 F (36.4 C)-98.9 F (37.2 C)] 98.2 F (36.8 C) (11/05 0855) Pulse Rate:  [80-93] 80 (11/05 1048) Resp:  [15-28] 18 (11/05 1048) BP: (123-151)/(63-77) 151/77 mmHg (11/05 0855) SpO2:  [92 %-96 %] 93 % (11/05 1048) FiO2 (%):  [40 %] 40 % (11/05 1045)  Weight:  [230 lb (104.327 kg)-311 lb 1.1 oz (141.1 kg)] 311 lb 1.1 oz (141.1 kg) (11/05 0855) HEMODYNAMICS:   VENTILATOR SETTINGS: Vent Mode:  [-]  FiO2 (%):  [40 %] 40 % INTAKE / OUTPUT:  Intake/Output Summary (Last 24 hours) at 07/19/15 1138 Last data filed at 07/19/15 1000  Gross per 24 hour  Intake      0 ml  Output      0 ml   Net      0 ml    PHYSICAL EXAMINATION: General:  Chronically ill appearing female, NAD  Neuro:  Lethargic but arousable, appropriate when awake but falls quickly back to sleep, MAE, follows commands  HEENT:  Mm moist, no JVD, bipap  Cardiovascular:  s1s2 rrr Lungs:  resps even, non labored on bipap, coarse, bibasilar crackles, few exp wheeze  Abdomen:  Round, soft, non tender  Musculoskeletal:  Warm and dry, 1+ BLE edema   LABS:  CBC  Recent Labs Lab 07/19/15 0545 07/19/15 0549 07/19/15 0648  WBC 8.1  --  12.0*  HGB 16.8* 12.2 9.9*  HCT 48.5* 36.0 36.4  PLT 229  --  195   Coag's No results for input(s): APTT, INR in the last 168 hours. BMET  Recent Labs Lab 07/19/15 0545 07/19/15 0549  NA 144 142  K 4.1 3.9  CL 103 98*  CO2 34*  --   BUN 31* 30*  CREATININE 0.67 0.80  GLUCOSE 116* 119*   Electrolytes  Recent Labs Lab 07/19/15 0545  CALCIUM 8.1*   Sepsis Markers No results for input(s): LATICACIDVEN, PROCALCITON, O2SATVEN in the last 168 hours. ABG  Recent Labs Lab 07/19/15 0520 07/19/15 1029  PHART 7.300* 7.175*  PCO2ART 70.6* 95.6*  PO2ART 75.5* 99.8   Liver Enzymes No results for input(s): AST, ALT, ALKPHOS, BILITOT, ALBUMIN in the last 168 hours. Cardiac Enzymes No results for input(s): TROPONINI, PROBNP in the last 168 hours. Glucose No results for input(s): GLUCAP in the last 168 hours.  Imaging Ct Angio Chest Pe W/cm &/or Wo Cm  07/19/2015  CLINICAL DATA:  64 year old female with a history of short of breath EXAM: CT ANGIOGRAPHY CHEST WITH CONTRAST TECHNIQUE: Multidetector CT imaging of the chest was performed using the standard protocol during bolus administration of intravenous contrast. Multiplanar CT image reconstructions and MIPs were obtained to evaluate the vascular anatomy. CONTRAST:  135mL OMNIPAQUE IOHEXOL 350 MG/ML SOLN COMPARISON:  None. FINDINGS: Chest: Unremarkable appearance of the chest superficial soft tissues. No axillary  or supraclavicular adenopathy. Surgical clips within the left axilla. Unremarkable appearance of the thoracic inlet, including the visualized thyroid. Several mediastinal lymph nodes, none of which are enlarged by CT size criteria or have suspicious features. Unremarkable appearance of the esophagus. Unremarkable course caliber and contour of the thoracic aorta without dissection flap, aneurysm, periaortic fluid. No central, lobar, segmental, or proximal subsegmental filling defects to indicate pulmonary emboli. Transverse diameter of the main pulmonary artery measures 4.2 cm. There is prominence of the pulmonary arteries as they extend to the peripheral 1/3 of the lungs, and they are greater in diameter than the accompanying bronchi at the periphery. Heart size borderline enlarged. Trace pericardial fluid/ thickening, particularly anteriorly. Calcifications of the left main and circumflex coronary arteries. Diffuse centrilobular and paraseptal emphysema. No confluent airspace disease. Small right pleural effusion. Mixed ground-glass and nodular opacity in the dependent lung bases, more pronounced on the left. Trace interlobular septal thickening, more pronounced at the superior aspects of the lungs. Upper  abdomen: Surgical changes of gastric bypass.  Small hiatal hernia. Review of the MIP images confirms the above findings. IMPRESSION: Trace right-sided pleural effusion, and minimal interlobular septal thickening. This may reflect mild edema, and correlation with a history of CHF and current lab values may be useful. Diffuse emphysema Evidence of developing pulmonary hypertension, with enlarged main pulmonary artery and prominence of the distal pulmonary arteries of the bilateral lungs. If not already performed, referral for pulmonary evaluation may be useful. Signed, Dulcy Fanny. Earleen Newport, DO [facility] Electronically Signed   By: Corrie Mckusick D.O.   On:  07/19/2015 08:08   Dg Abd Acute W/chest  07/19/2015  CLINICAL DATA:  Dyspnea. Hypoxia. Central chest and abdominal pain for 2 weeks. EXAM: DG ABDOMEN ACUTE W/ 1V CHEST COMPARISON:  02/05/2014 FINDINGS: There is no evidence of dilated bowel loops or free intraperitoneal air. No radiopaque calculi or other significant radiographic abnormality is seen. Heart size and mediastinal contours are within normal limits. Both lungs are clear. IMPRESSION: Negative abdominal radiographs.  No acute cardiopulmonary disease. Electronically Signed   By: Andreas Newport M.D.   On: 07/19/2015 06:11     ASSESSMENT / PLAN:  PULMONARY Acute on chronic hypercarbic respiratory failure - r/t AECOPD +/- pulmonary edema  COPD  Respiratory acidosis  P:   bipap -- 2 hours on, 4 hours off if f/u ABG is improving  F/u ABG in 1 hour  IV solumedrol  BD's  Change Abx to IV for now  F/u CXR  Lasix x1  pulm hygiene once off bipap   CARDIOVASCULAR Hx HFpEF - BNP elevated  Hx HTN  P:  Lasix x 1  Trend BNP, troponin   RENAL No active issue  P:   Monitor chem with lasix, levaquin   GASTROINTESTINAL No active issue  P:   NPO for now with bipap  PPI   HEMATOLOGIC Polycythemia  P:  F/u cbc   INFECTIOUS AECOPD  No evidence CAP  P:   Levaquin 11/5>>>  Narrow quickly once resp status improves  Trend WBC, fever curve   ENDOCRINE No active issue  P:   Monitor glucose on chem   NEUROLOGIC AMS - hypercarbia  P:   bipap as above  F/u ABG  Now that bipap has been on ~30 mins she is more awake and not cooperating with bipap - will need soft wrist restraints short term to ensure resolution of respiratory acidosis  Avoid sedating medications   FAMILY  - Updates:  No family available 11/5   Nickolas Madrid, NP 07/19/2015  11:38 AM Pager: [phone] or [phone]

## 2015-07-19 NOTE — ED Notes (Signed)
Report given to [facility] w/ [facility]

## 2015-07-19 NOTE — ED Notes (Signed)
Resp therapy did arterial stick and obtained labs when getting abg.

## 2015-07-19 NOTE — ED Provider Notes (Signed)
CSN: [id number]     Arrival date & time 07/19/15  0407 History   First MD Initiated Contact with Patient 07/19/15 0502     Chief Complaint  Patient presents with  . Shortness of Breath     (Consider location/radiation/quality/duration/timing/severity/associated sxs/prior Treatment) Patient is a 64 y.o. female presenting with shortness of breath. The history is provided by the patient.  Shortness of Breath Severity:  Severe Onset quality:  Gradual Timing:  Constant Progression:  Worsening Chronicity:  Recurrent Context: activity   Context: not fumes   Relieved by:  Nothing Worsened by:  Nothing tried Ineffective treatments:  None tried Associated symptoms: no chest pain, no diaphoresis, no fever and no syncope   Risk factors: no recent alcohol use     Past Medical History  Diagnosis Date  . Psoriasis   . Macular degeneration    Past Surgical History  Procedure Laterality Date  . Left knee replacement  1995  . Gastric bypass  May 2004   Family History  Problem Relation Age of Onset  . Osteoarthritis Mother   . Hypertension Mother   . Thyroid disease Mother    Social History  Substance Use Topics  . Smoking status: Former Smoker -- 0.50 packs/day for 40 years    Types: Cigarettes    Quit date: 12/15/2013  . Smokeless tobacco: Never Used  . Alcohol Use: No   OB History    No data available     Review of Systems  Constitutional: Negative for fever and diaphoresis.  Respiratory: Positive for shortness of breath.   Cardiovascular: Negative for chest pain and syncope.  Gastrointestinal: Positive for abdominal distention.  All other systems reviewed and are negative.     Allergies  Review of patient's allergies indicates no known allergies.  Home Medications   Prior to Admission medications   Medication Sig Start Date End Date Taking? Authorizing Provider  albuterol (PROVENTIL HFA;VENTOLIN HFA) 108 (90 BASE) MCG/ACT inhaler Inhale 2 puffs into the lungs  every 6 (six) hours as needed for wheezing or shortness of breath. 12/31/13   Olam Idler, MD  Multiple Vitamin (MULTIVITAMIN WITH MINERALS) TABS tablet Take 1 tablet by mouth daily.    Historical Provider, MD  naproxen sodium (ANAPROX) 220 MG tablet Take 1 tablet (220 mg total) by mouth 2 (two) times daily as needed. 05/01/14   Mariel Aloe, MD   BP 127/66 mmHg  Temp(Src) 98.7 F (37.1 C) (Oral)  Resp 15  Ht 5\' 5"  (1.651 m)  Wt 230 lb (104.327 kg)  BMI 38.27 kg/m2 Physical Exam  Constitutional: She is oriented to person, place, and time. She appears well-developed and well-nourished. No distress.  HENT:  Head: Normocephalic and atraumatic.  Mouth/Throat: Oropharynx is clear and moist.  Eyes: EOM are normal. Pupils are equal, round, and reactive to light.  Neck: Normal range of motion. Neck supple.  Cardiovascular: Normal rate, regular rhythm and intact distal pulses.   Pulmonary/Chest: She has decreased breath sounds. She has no wheezes. She has no rhonchi. She has no rales.  Abdominal: Soft. Bowel sounds are normal. There is no tenderness. There is no rebound and no guarding.  Musculoskeletal: Normal range of motion. She exhibits edema. She exhibits no tenderness.  Neurological: She is alert and oriented to person, place, and time. She has normal reflexes.  Skin: Skin is warm and dry. There is pallor.  Psychiatric: She has a normal mood and affect.    ED Course  Procedures (including critical  care time) Labs Review Labs Reviewed  CBC - Abnormal; Notable for the following:    RBC 5.43 (*)    Hemoglobin 16.8 (*)    HCT 48.5 (*)    All other components within normal limits  BLOOD GAS, ARTERIAL - Abnormal; Notable for the following:    pH, Arterial 7.300 (*)    pCO2 arterial 70.6 (*)    pO2, Arterial 75.5 (*)    Bicarbonate 33.7 (*)    Acid-Base Excess 6.2 (*)    All other components within normal limits  I-STAT CHEM 8, ED - Abnormal; Notable for the following:     Chloride 98 (*)    BUN 30 (*)    Glucose, Bld 119 (*)    Calcium, Ion 1.04 (*)    All other components within normal limits  BASIC METABOLIC PANEL  BRAIN NATRIURETIC PEPTIDE  OCCULT BLOOD X 1 CARD TO LAB, STOOL  I-STAT TROPOININ, ED  POC OCCULT BLOOD, ED  TYPE AND SCREEN    Imaging Review Dg Abd Acute W/chest  07/19/2015  CLINICAL DATA:  Dyspnea. Hypoxia. Central chest and abdominal pain for 2 weeks. EXAM: DG ABDOMEN ACUTE W/ 1V CHEST COMPARISON:  02/05/2014 FINDINGS: There is no evidence of dilated bowel loops or free intraperitoneal air. No radiopaque calculi or other significant radiographic abnormality is seen. Heart size and mediastinal contours are within normal limits. Both lungs are clear. IMPRESSION: Negative abdominal radiographs.  No acute cardiopulmonary disease. Electronically Signed   By: Andreas Newport M.D.   On: 07/19/2015 06:11   I have personally reviewed and evaluated these images and lab results as part of my medical decision-making.   EKG Interpretation   Date/Time:  Saturday July 19 2015 05:02:05 EDT Ventricular Rate:  98 PR Interval:  102 QRS Duration: 86 QT Interval:  382 QTC Calculation: 488 R Axis:   80 Text Interpretation:  Sinus rhythm Short PR interval Confirmed by  [facility]  MD, Dejai Schubach ([id number]) on 07/19/2015 5:53:56 AM      MDM   Final diagnoses:  None    Medications  albuterol (PROVENTIL) (2.5 MG/3ML) 0.083% nebulizer solution 5 mg (not administered)  methylPREDNISolone sodium succinate (SOLU-MEDROL) 125 mg/2 mL injection 125 mg (not administered)   Results for orders placed or performed during the hospital encounter of 07/19/15  CBC  Result Value Ref Range   WBC 8.1 4.0 - 10.5 K/uL   RBC 5.43 (H) 3.87 - 5.11 MIL/uL   Hemoglobin 16.8 (H) 12.0 - 15.0 g/dL   HCT 48.5 (H) 36.0 - 46.0 %   MCV 89.3 78.0 - 100.0 fL   MCH 30.9 26.0 - 34.0 pg   MCHC 34.6 30.0 - 36.0 g/dL   RDW 12.9 11.5 - 15.5 %   Platelets 229 150 - 400 K/uL   Blood gas, arterial  Result Value Ref Range   O2 Content 4.0 L/min   Delivery systems NASAL CANNULA    pH, Arterial 7.300 (L) 7.350 - 7.450   pCO2 arterial 70.6 (HH) 35.0 - 45.0 mmHg   pO2, Arterial 75.5 (L) 80.0 - 100.0 mmHg   Bicarbonate 33.7 (H) 20.0 - 24.0 mEq/L   TCO2 32.0 0 - 100 mmol/L   Acid-Base Excess 6.2 (H) 0.0 - 2.0 mmol/L   O2 Saturation 91.5 %   Patient temperature 98.6    Collection site BRACHIAL ARTERY    Drawn by [id number]    Sample type ARTERIAL DRAW   I-stat troponin, ED (not at [facility])  Result Value  Ref Range   Troponin i, poc 0.03 0.00 - 0.08 ng/mL   Comment 3          I-Stat Chem 8, ED  Result Value Ref Range   Sodium 142 135 - 145 mmol/L   Potassium 3.9 3.5 - 5.1 mmol/L   Chloride 98 (L) 101 - 111 mmol/L   BUN 30 (H) 6 - 20 mg/dL   Creatinine, Ser 0.80 0.44 - 1.00 mg/dL   Glucose, Bld 119 (H) 65 - 99 mg/dL   Calcium, Ion 1.04 (L) 1.13 - 1.30 mmol/L   TCO2 33 0 - 100 mmol/L   Hemoglobin 12.2 12.0 - 15.0 g/dL   HCT 36.0 36.0 - 46.0 %  POC occult blood, ED  Result Value Ref Range   Fecal Occult Bld NEGATIVE NEGATIVE   Dg Abd Acute W/chest  07/19/2015  CLINICAL DATA:  Dyspnea. Hypoxia. Central chest and abdominal pain for 2 weeks. EXAM: DG ABDOMEN ACUTE W/ 1V CHEST COMPARISON:  02/05/2014 FINDINGS: There is no evidence of dilated bowel loops or free intraperitoneal air. No radiopaque calculi or other significant radiographic abnormality is seen. Heart size and mediastinal contours are within normal limits. Both lungs are clear. IMPRESSION: Negative abdominal radiographs.  No acute cardiopulmonary disease. Electronically Signed   By: Andreas Newport M.D.   On: 07/19/2015 06:11      Medications  albuterol (PROVENTIL) (2.5 MG/3ML) 0.083% nebulizer solution 5 mg (5 mg Nebulization Given 07/19/15 0637)  methylPREDNISolone sodium succinate (SOLU-MEDROL) 125 mg/2 mL injection 125 mg (125 mg Intravenous Given 07/19/15 0637)  cefTRIAXone (ROCEPHIN) 1 g in  dextrose 5 % 50 mL IVPB (0 g Intravenous Stopped 07/19/15 0813)  iohexol (OMNIPAQUE) 350 MG/ML injection 100 mL (100 mLs Intravenous Contrast Given 07/19/15 0735)   Results for orders placed or performed during the hospital encounter of 07/19/15  CBC  Result Value Ref Range   WBC 8.1 4.0 - 10.5 K/uL   RBC 5.43 (H) 3.87 - 5.11 MIL/uL   Hemoglobin 16.8 (H) 12.0 - 15.0 g/dL   HCT 48.5 (H) 36.0 - 46.0 %   MCV 89.3 78.0 - 100.0 fL   MCH 30.9 26.0 - 34.0 pg   MCHC 34.6 30.0 - 36.0 g/dL   RDW 12.9 11.5 - 15.5 %   Platelets 229 150 - 400 K/uL  Basic metabolic panel  Result Value Ref Range   Sodium 144 135 - 145 mmol/L   Potassium 4.1 3.5 - 5.1 mmol/L   Chloride 103 101 - 111 mmol/L   CO2 34 (H) 22 - 32 mmol/L   Glucose, Bld 116 (H) 65 - 99 mg/dL   BUN 31 (H) 6 - 20 mg/dL   Creatinine, Ser 0.67 0.44 - 1.00 mg/dL   Calcium 8.1 (L) 8.9 - 10.3 mg/dL   GFR calc non Af Amer >60 >60 mL/min   GFR calc Af Amer >60 >60 mL/min   Anion gap 7 5 - 15  Blood gas, arterial  Result Value Ref Range   O2 Content 4.0 L/min   Delivery systems NASAL CANNULA    pH, Arterial 7.300 (L) 7.350 - 7.450   pCO2 arterial 70.6 (HH) 35.0 - 45.0 mmHg   pO2, Arterial 75.5 (L) 80.0 - 100.0 mmHg   Bicarbonate 33.7 (H) 20.0 - 24.0 mEq/L   TCO2 32.0 0 - 100 mmol/L   Acid-Base Excess 6.2 (H) 0.0 - 2.0 mmol/L   O2 Saturation 91.5 %   Patient temperature 98.6    Collection site BRACHIAL ARTERY  Drawn by [id number]    Sample type ARTERIAL DRAW   Brain natriuretic peptide (only with dyspnea)  Result Value Ref Range   B Natriuretic Peptide 831.2 (H) 0.0 - 100.0 pg/mL  CBC with Differential  Result Value Ref Range   WBC 12.0 (H) 4.0 - 10.5 K/uL   RBC 4.13 3.87 - 5.11 MIL/uL   Hemoglobin 9.9 (L) 12.0 - 15.0 g/dL   HCT 36.4 36.0 - 46.0 %   MCV 88.1 78.0 - 100.0 fL   MCH 24.0 (L) 26.0 - 34.0 pg   MCHC 27.2 (L) 30.0 - 36.0 g/dL   RDW 17.5 (H) 11.5 - 15.5 %   Platelets 195 150 - 400 K/uL   Neutrophils Relative % 76 %    Neutro Abs 9.1 (H) 1.7 - 7.7 K/uL   Lymphocytes Relative 12 %   Lymphs Abs 1.5 0.7 - 4.0 K/uL   Monocytes Relative 12 %   Monocytes Absolute 1.4 (H) 0.1 - 1.0 K/uL   Eosinophils Relative 0 %   Eosinophils Absolute 0.0 0.0 - 0.7 K/uL   Basophils Relative 0 %   Basophils Absolute 0.0 0.0 - 0.1 K/uL  I-stat troponin, ED (not at [facility], [facility])  Result Value Ref Range   Troponin i, poc 0.03 0.00 - 0.08 ng/mL   Comment 3          I-Stat Chem 8, ED  Result Value Ref Range   Sodium 142 135 - 145 mmol/L   Potassium 3.9 3.5 - 5.1 mmol/L   Chloride 98 (L) 101 - 111 mmol/L   BUN 30 (H) 6 - 20 mg/dL   Creatinine, Ser 0.80 0.44 - 1.00 mg/dL   Glucose, Bld 119 (H) 65 - 99 mg/dL   Calcium, Ion 1.04 (L) 1.13 - 1.30 mmol/L   TCO2 33 0 - 100 mmol/L   Hemoglobin 12.2 12.0 - 15.0 g/dL   HCT 36.0 36.0 - 46.0 %  POC occult blood, ED  Result Value Ref Range   Fecal Occult Bld NEGATIVE NEGATIVE  Type and screen [facility]  Result Value Ref Range   ABO/RH(D) A NEG    Antibody Screen NEG    Sample Expiration 07/22/2015    Ct Angio Chest Pe W/cm &/or Wo Cm  07/19/2015  CLINICAL DATA:  64 year old female with a history of short of breath EXAM: CT ANGIOGRAPHY CHEST WITH CONTRAST TECHNIQUE: Multidetector CT imaging of the chest was performed using the standard protocol during bolus administration of intravenous contrast. Multiplanar CT image reconstructions and MIPs were obtained to evaluate the vascular anatomy. CONTRAST:  192mL OMNIPAQUE IOHEXOL 350 MG/ML SOLN COMPARISON:  None. FINDINGS: Chest: Unremarkable appearance of the chest superficial soft tissues. No axillary or supraclavicular adenopathy. Surgical clips within the left axilla. Unremarkable appearance of the thoracic inlet, including the visualized thyroid. Several mediastinal lymph nodes, none of which are enlarged by CT size criteria or have suspicious features. Unremarkable appearance of the esophagus. Unremarkable course caliber  and contour of the thoracic aorta without dissection flap, aneurysm, periaortic fluid. No central, lobar, segmental, or proximal subsegmental filling defects to indicate pulmonary emboli. Transverse diameter of the main pulmonary artery measures 4.2 cm. There is prominence of the pulmonary arteries as they extend to the peripheral 1/3 of the lungs, and they are greater in diameter than the accompanying bronchi at the periphery. Heart size borderline enlarged. Trace pericardial fluid/ thickening, particularly anteriorly. Calcifications of the left main and circumflex coronary arteries. Diffuse centrilobular and paraseptal emphysema. No  confluent airspace disease. Small right pleural effusion. Mixed ground-glass and nodular opacity in the dependent lung bases, more pronounced on the left. Trace interlobular septal thickening, more pronounced at the superior aspects of the lungs. Upper abdomen: Surgical changes of gastric bypass.  Small hiatal hernia. Review of the MIP images confirms the above findings. IMPRESSION: Trace right-sided pleural effusion, and minimal interlobular septal thickening. This may reflect mild edema, and correlation with a history of CHF and current lab values may be useful. Diffuse emphysema Evidence of developing pulmonary hypertension, with enlarged main pulmonary artery and prominence of the distal pulmonary arteries of the bilateral lungs. If not already performed, referral for pulmonary evaluation may be useful. Signed, Dulcy Fanny. Earleen Newport, DO [facility] Electronically Signed   By: Corrie Mckusick D.O.   On: 07/19/2015 08:08   Dg Abd Acute W/chest  07/19/2015  CLINICAL DATA:  Dyspnea. Hypoxia. Central chest and abdominal pain for 2 weeks. EXAM: DG ABDOMEN ACUTE W/ 1V CHEST COMPARISON:  02/05/2014 FINDINGS: There is no evidence of dilated bowel loops or free intraperitoneal air. No radiopaque calculi or other significant radiographic  abnormality is seen. Heart size and mediastinal contours are within normal limits. Both lungs are clear. IMPRESSION: Negative abdominal radiographs.  No acute cardiopulmonary disease. Electronically Signed   By: Andreas Newport M.D.   On: 07/19/2015 06:11      Cola Highfill, MD 07/19/15 [phone]

## 2015-07-19 NOTE — Progress Notes (Signed)
MD at bedside, restraints removed per MD. Pt more alert and oriented. Pt states she can not remember earlier today and why she needed Bipap. Pt states she wants to be a full code and will not pull at the bipap mask. [employer]

## 2015-07-19 NOTE — Progress Notes (Signed)
FPTS Interim Progress Note  S: Patient was lying in bed. Alert and orientated x3. She is no longer lethargic. She was upset as she felt nobody had come to see her. I explained that I had been up to check on her 3 times throughout the day. However, because she was very confused at the time, she did not remember our discussions. While there, I reviewed her code status with her, and she agreed that wanted to be a full code. We discussed the plan and I explained the reason for the BiPAP. Patient seemed to acknowledge and understand the treatment plans. She asked to have the soft restraints removed and understood that she should not pull the BiPAP mask off her face. During this discussion, I removed the BiPAP for about 6 minutes, at which time patient began to desat to 87% on pulse oximetry. BiPAP mask was replaced.   O: BP 124/68 mmHg  Pulse 68  Temp(Src) 98.1 F (36.7 C) (Axillary)  Resp 17  Ht 5\' 5"  (1.651 m)  Wt 311 lb 1.1 oz (141.1 kg)  BMI 51.76 kg/m2  SpO2 97%   A/P: - Removed soft restraints as patient is no longer altered or confused, will reassess as needed  - Continue patient on BiPAP  - Will recheck ABG now  - Will consider Non-Rebreather depending on ABG   [name] Cletis Media, MD 07/19/2015, 6:32 PM PGY-1, [place] [facility] pager [phone]

## 2015-07-19 NOTE — H&P (Signed)
[facility] Admission History and Physical Service Pager: [phone]  Patient name: [name] Medical record number: [id number] Date of birth: 1951-05-30 Age: 64 y.o. Gender: female  Primary Care Provider: Ronnie Doss, DO Consultants: CCM Code Status: Full code - per previous admission - will confirm with patient when she is more awake  Chief Complaint: SOB   Assessment and Plan: [name] is a 64 y.o. female presenting with SOB. PMH is significant for Breast Cancer hx, COPD,  HTN, Psoriasis, Macular Degeneration, HFpEF, Gastric Bypass.   #Acute on Chronic Hypercapnic Respiratory Failure: Patient with diagnosis of COPD and 40-pack-year hx. Patient does not take any controller medications at home and does not believe that she has a diagnosis of COPD. In April of 2015, patient was admitted for CAP requiring intubation, patient was discharged with home oxygen but did not use it. On transfer from [facility], ABG was positive for respiratory acidosis with PH 7.3, PCO2 70.6, HCO3  34, indicating an acute respiratory decompensation, with likely some degree of chronic CO2 retention . CTA with trace right-sided pleural effusion, and minimal interlobular septal thickening, no concern for PE. Patient was S/p abluterol, Ceftriaxone, Solumedrol. On admission patient was somnolent, ABG was rechecked and came back at a ph 7.175 pCO2 95%. Likely patient with acute respiratory failure due to untreated COPD with acute exacerbation.   - Admit to Step-down, attending Dr. Andria Frames  - Will start BiPAP as patient is hypercarbic  - Will recheck ABG in 1 hour after BIPAP  - Consult to CCM, appreciate recs - Will continue Levaquin, Duonebs q4hr with prn albuterol for treatment of COPD exacerbation - Continue Solumedrol BID    - Will keep NPO for now, IV meds only - can transition to Doxycycline and Prednisone when able to take PO - Continuous pulse oximetry - Will need further  teaching regarding COPD and importance of medications before discharge  - Consider starting Spiriva or Advair prior to discharge  # Altered Mental Status: Likely related to hypercarbia in setting of acute respiratory failure.  Improving slowly with Bipap - Monitor closely - BiPAP as above - Avoid sedating medications  # Abdominal fullness: Some notes in chart regarding patient complaints of abd fullness. KUB negative - Continue to monitor  #HFpEF, Last Echo 12/2013 with EF 60-65% and Grade 1 Diastolic Dysfunction. On admission 2+ pitting edema mid-calves with respiratory failure. EKG low voltage NSR, no evidence of ischemia. Respiratory status most likely 2/2 COPD exacerbation rather than volume overload - Will consider getting repeat ECHO - Will trend troponins x3  - Will get BNP  - Per CCM Lasix x 1 - f/u diuresis - Will get an AM EKG   #HTN  BP 139/60. No home med  - Continue to monitor   #Hx Breast Cancer (1997). Patient had a DVT during this admission as well   FEN/GI: NPO until improvement in respiratory status Prophylaxis: Lovenox, PPI while NPO on BiPAP  Disposition: Home  History of Present Illness:  [name] is a 64 y.o. female presenting with SOB over the past two weeks, with acute worsening. At [facility] patient was also complaining of abdominal fullness.. She received prednisone, solumedrol and ceftriaxone for COPD exacerbation. Imaging included CTA and abdominal XRAY was completed. While at [facility], patient had desats in the 55s. ABG was positive for pH 7.3. Patient was started on BiPAP, however refused and was transferred over to [facility] on [address]. On arrival, patient was difficult to arouse. After  sternal rub, patient did open eyes and was able to eventually tell us her name and where she was but remained very confused. Stat ABG was obtained, CCM was called, and patient was placed on BiPAP  Level V Caveat: unable to obtain full hx and ROS as a result of altered mental status    Review Of Systems: Per HPI with the following additions:  Otherwise the remainder of the systems were negative.  Patient Active Problem List   Diagnosis Date Noted  . COPD (chronic obstructive pulmonary disease) ([place]) 07/19/2015  . Eye swelling 05/01/2014  . CAP (community acquired pneumonia) 12/24/2013  . Acute on chronic respiratory failure ([place]) 12/24/2013  . COPD exacerbation ([place]) 12/24/2013  . Pneumonia 12/23/2013  . Shoulder and upper arm, abrasion or friction burn, without mention of infection 05/03/2012  . Fall down stairs 12/31/2011  . Hypoalbuminemia 11/23/2011  . Anemia, iron deficiency 10/26/2011  . Hypocalcemia 10/26/2011  . Impaired fasting glucose 10/26/2011  . Fatigue 10/26/2011  . Lower extremity edema 10/20/2011  . Dyspnea 01/12/2011  . Bibasilar crackles 01/12/2011  . Osteoarthritis 01/04/2011  . TOBACCO ABUSE 08/14/2009  . ALLERGIC RHINITIS CAUSE UNSPECIFIED 08/14/2009  . ABNORMAL WEIGHT GAIN 08/14/2009  . Hypertension 08/14/2009  . PLANTAR WART, RIGHT 12/06/2008  . PSORIASIS 12/06/2008  . BREAST CANCER, HX OF 12/06/2008    Past Medical History: Past Medical History  Diagnosis Date  . Psoriasis   . Macular degeneration   . Abnormal weight gain 08/14/2009    Qualifier: Diagnosis of  By: Lindell Noe MD, Jeneen Rinks    . Tobacco abuse   . Allergic rhinitis   . Hypertension   . Cancer [facility])     Breast CA left  . Arthritis     osteo  . Anemia   . Hypocalcemia   . Edema extremities     lower  . Fatigue   . Hypoalbuminemia   . Pneumonia   . CAP (community acquired pneumonia)   . COPD (chronic obstructive pulmonary disease) ([place])   . Acute respiratory failure [facility])     Past Surgical History: Past Surgical History  Procedure Laterality Date  . Left knee replacement  1995  . Gastric bypass  May 2004  . Breast surgery      Social History: Social History  Substance Use Topics  . Smoking status: Former Smoker -- 0.50 packs/day for 40 years    Types:  Cigarettes    Quit date: 12/15/2013  . Smokeless tobacco: Never Used  . Alcohol Use: No   Additional social history: 40 pack-year hx of smoking Please also refer to relevant sections of EMR.  Family History: Family History  Problem Relation Age of Onset  . Osteoarthritis Mother   . Hypertension Mother   . Thyroid disease Mother     Allergies and Medications: No Known Allergies No current facility-administered medications on file prior to encounter.   Current Outpatient Prescriptions on File Prior to Encounter  Medication Sig Dispense Refill  . albuterol (PROVENTIL HFA;VENTOLIN HFA) 108 (90 BASE) MCG/ACT inhaler Inhale 2 puffs into the lungs every 6 (six) hours as needed for wheezing or shortness of breath. 1 Inhaler 2  . Multiple Vitamin (MULTIVITAMIN WITH MINERALS) TABS tablet Take 1 tablet by mouth daily.    . naproxen sodium (ANAPROX) 220 MG tablet Take 1 tablet (220 mg total) by mouth 2 (two) times daily as needed. 30 tablet 0    Objective: BP 136/72 mmHg  Pulse 91  Temp(Src) 97.6 F (36.4 C) (  Oral)  Resp 18  Ht 5\' 5"  (1.651 m)  Wt 230 lb (104.327 kg)  BMI 38.27 kg/m2  SpO2 93% Exam: General: Patient lying in bed, lethargic, unable to stay awake to answer questions Eyes: Pupils Equal Round Reactive to light Conjunctiva without redness or discharge ENTM: Moist mucosa membrane, OP clear  Neck: Supple, No lymphadenopathy  Cardiovascular: RRR, no murmurs noted, intact distal pulses Respiratory: Expiratory wheezes in the upper lobes, decreased breaths in the lower lobes, patient with increased work of breathing, including supraclavicular and subcostal retractions  Abdomen: Soft, BS+, NTND, no rebound  MSK: 2+ pitting edema to mid-calves  Skin: no sores or suspicious lesions or rashes or color changes Neuro: Lethargic on exam, only on repeat questioning was patient orientated to self and place  Labs and Imaging: CBC BMET   Recent Labs Lab 07/19/15 0648  WBC 12.0*   HGB 9.9*  HCT 36.4  PLT 195    Recent Labs Lab 07/19/15 0545 07/19/15 0549  NA 144 142  K 4.1 3.9  CL 103 98*  CO2 34*  --   BUN 31* 30*  CREATININE 0.67 0.80  GLUCOSE 116* 119*  CALCIUM 8.1*  --        Results for INESSA, WARDROP (MRN [id number]) as of 07/19/2015 13:14  Ref. Range 07/19/2015 05:20  pH, Arterial Latest Ref Range: 7.350-7.450  7.300 (L)  pCO2 arterial Latest Ref Range: 35.0-45.0 mmHg 70.6 (HH)  pO2, Arterial Latest Ref Range: 80.0-100.0 mmHg 75.5 (L)  Bicarbonate Latest Ref Range: 20.0-24.0 mEq/L 33.7 (H)  TCO2 Latest Ref Range: 0-100 mmol/L 32.0  Acid-Base Excess Latest Ref Range: 0.0-2.0 mmol/L 6.2 (H)  O2 Saturation Latest Units: % 91.5  Patient temperature Unknown 98.6  Collection site Unknown BRACHIAL ARTERY    Ct Angio Chest Pe W/cm &/or Wo Cm  07/19/2015  CLINICAL DATA:  64 year old female with a history of short of breath EXAM: CT ANGIOGRAPHY CHEST WITH CONTRAST TECHNIQUE: Multidetector CT imaging of the chest was performed using the standard protocol during bolus administration of intravenous contrast. Multiplanar CT image reconstructions and MIPs were obtained to evaluate the vascular anatomy. CONTRAST:  186mL OMNIPAQUE IOHEXOL 350 MG/ML SOLN COMPARISON:  None. FINDINGS: Chest: Unremarkable appearance of the chest superficial soft tissues. No axillary or supraclavicular adenopathy. Surgical clips within the left axilla. Unremarkable appearance of the thoracic inlet, including the visualized thyroid. Several mediastinal lymph nodes, none of which are enlarged by CT size criteria or have suspicious features. Unremarkable appearance of the esophagus. Unremarkable course caliber and contour of the thoracic aorta without dissection flap, aneurysm, periaortic fluid. No central, lobar, segmental, or proximal subsegmental filling defects to indicate pulmonary emboli. Transverse diameter of the main pulmonary artery measures 4.2 cm. There is prominence of the  pulmonary arteries as they extend to the peripheral 1/3 of the lungs, and they are greater in diameter than the accompanying bronchi at the periphery. Heart size borderline enlarged. Trace pericardial fluid/ thickening, particularly anteriorly. Calcifications of the left main and circumflex coronary arteries. Diffuse centrilobular and paraseptal emphysema. No confluent airspace disease. Small right pleural effusion. Mixed ground-glass and nodular opacity in the dependent lung bases, more pronounced on the left. Trace interlobular septal thickening, more pronounced at the superior aspects of the lungs. Upper abdomen: Surgical changes of gastric bypass.  Small hiatal hernia. Review of the MIP images confirms the above findings. IMPRESSION: Trace right-sided pleural effusion, and minimal interlobular septal thickening. This may reflect mild edema, and correlation with a history of  CHF and current lab values may be useful. Diffuse emphysema Evidence of developing pulmonary hypertension, with enlarged main pulmonary artery and prominence of the distal pulmonary arteries of the bilateral lungs. If not already performed, referral for pulmonary evaluation may be useful. Signed, Dulcy Fanny. Earleen Newport, DO [facility] Radiology Electronically Signed   By: Corrie Mckusick D.O.   On: 07/19/2015 08:08   Dg Abd Acute W/chest  07/19/2015  CLINICAL DATA:  Dyspnea. Hypoxia. Central chest and abdominal pain for 2 weeks. EXAM: DG ABDOMEN ACUTE W/ 1V CHEST COMPARISON:  02/05/2014 FINDINGS: There is no evidence of dilated bowel loops or free intraperitoneal air. No radiopaque calculi or other significant radiographic abnormality is seen. Heart size and mediastinal contours are within normal limits. Both lungs are clear. IMPRESSION: Negative abdominal radiographs.  No acute cardiopulmonary disease. Electronically Signed   By: Andreas Newport M.D.   On: 07/19/2015 06:11     Asiyah Cletis Media, MD 07/19/2015, 9:16 AM PGY-1, [place] [facility] pager: [phone], text pages welcome   Upper Level Addendum:  I have seen and evaluated this patient along with Dr. Emmaline Life and reviewed the above note, making necessary revisions in pink.  Virginia Crews, MD, MPH PGY-2,  [facility] 07/19/2015 2:33 PM

## 2015-07-19 NOTE — Progress Notes (Signed)
Seen and examined.  Discussed residents.  We have a plan.  I will co-sign their H&PE when available.  Greatly appreciate CCM help.  Briefly, patient admitted with a clear cut, severe COPD exacerbation.  She clearly has acute respiratory failure with both hypoxia and hypercapnea.  Thankfully, she seems to have stabilized on BIPAP.  Her initial ABGs suggest that she has some element of chronic CO2 retention.    My history and our communication was limited due to her being on BIPAP.  I am led to believe that she has been in marked denial about the COPD diagnosis and refused to take outpatient controler medications.  Obviously, we will need to explore this further as she stabilizes.  We will need to try to understand the why behind this puzzling behavior.

## 2015-07-19 NOTE — Progress Notes (Signed)
Patient refuses to use BiPAP at this time. EDP and RN aware.

## 2015-07-19 NOTE — Plan of Care (Signed)
Problem: Physical Regulation: Goal: Ability to maintain clinical measurements within normal limits will improve Outcome: Progressing Reviewed reason for ABG and use of Bipap machine

## 2015-07-19 NOTE — Progress Notes (Signed)
Pt very combative and refuses to keep bi pap on, O2 sats drop to 84%.  Order received to place bilateral wrist restraints. Consuelo Pandy RN

## 2015-07-20 ENCOUNTER — Inpatient Hospital Stay (HOSPITAL_COMMUNITY): Payer: Medicare Other

## 2015-07-20 LAB — RAPID URINE DRUG SCREEN, HOSP PERFORMED
Amphetamines: NOT DETECTED
Barbiturates: NOT DETECTED
Benzodiazepines: NOT DETECTED
Cocaine: NOT DETECTED
Opiates: NOT DETECTED
Tetrahydrocannabinol: NOT DETECTED

## 2015-07-20 LAB — BASIC METABOLIC PANEL
Anion gap: 7 (ref 5–15)
BUN: 22 mg/dL — ABNORMAL HIGH (ref 6–20)
CO2: 38 mmol/L — ABNORMAL HIGH (ref 22–32)
Calcium: 8.2 mg/dL — ABNORMAL LOW (ref 8.9–10.3)
Chloride: 100 mmol/L — ABNORMAL LOW (ref 101–111)
Creatinine, Ser: 0.5 mg/dL (ref 0.44–1.00)
GFR calc Af Amer: >60 mL/min (ref >60)
GFR calc non Af Amer: >60 mL/min (ref >60)
Glucose, Bld: 112 mg/dL — ABNORMAL HIGH (ref 65–99)
Potassium: 4.5 mmol/L (ref 3.5–5.1)
Sodium: 145 mmol/L (ref 135–145)

## 2015-07-20 LAB — CBC
HCT: 37.1 % (ref 36.0–46.0)
Hemoglobin: 9.7 g/dL — ABNORMAL LOW (ref 12.0–15.0)
MCH: 23.6 pg — ABNORMAL LOW (ref 26.0–34.0)
MCHC: 26.1 g/dL — ABNORMAL LOW (ref 30.0–36.0)
MCV: 90.3 fL (ref 78.0–100.0)
Platelets: 166 10*3/uL (ref 150–400)
RBC: 4.11 MIL/uL (ref 3.87–5.11)
RDW: 17.5 % — ABNORMAL HIGH (ref 11.5–15.5)
WBC: 6.5 10*3/uL (ref 4.0–10.5)

## 2015-07-20 LAB — BLOOD GAS, ARTERIAL
Acid-Base Excess: 9.9 mmol/L — ABNORMAL HIGH (ref 0.0–2.0)
Bicarbonate: 36.2 mEq/L — ABNORMAL HIGH (ref 20.0–24.0)
Delivery systems: POSITIVE
Drawn by: 312761
Expiratory PAP: 8
FIO2: 0.4
Inspiratory PAP: 16
O2 Saturation: 93.8 %
Patient temperature: 98.6
TCO2: 38.5 mmol/L (ref 0–100)
pCO2 arterial: 74.3 mmHg (ref 35.0–45.0)
pH, Arterial: 7.309 — ABNORMAL LOW (ref 7.350–7.450)
pO2, Arterial: 74.9 mmHg — ABNORMAL LOW (ref 80.0–100.0)

## 2015-07-20 LAB — LIPID PANEL
Cholesterol: 97 mg/dL (ref 0–200)
HDL: 17 mg/dL — ABNORMAL LOW (ref >40)
LDL Cholesterol: 67 mg/dL (ref 0–99)
Total CHOL/HDL Ratio: 5.7 RATIO
Triglycerides: 65 mg/dL (ref <150)
VLDL: 13 mg/dL (ref 0–40)

## 2015-07-20 LAB — MAGNESIUM: Magnesium: 2.2 mg/dL (ref 1.7–2.4)

## 2015-07-20 LAB — PHOSPHORUS: Phosphorus: 4.3 mg/dL (ref 2.5–4.6)

## 2015-07-20 NOTE — Care Management Note (Signed)
Case Management Note  Patient Details  Name: [name] MRN: [id number] Date of Birth: 1951/04/05  Subjective/Objective:                 Admitted with SOB / acute on chronic hypercarbic respiratory failure / COPD.  Uses a cane intermittently with ambulation PTA.  Hx: smoker   Action/Plan: Return to home when medically stable. CM to f/u with d/c needs.  Expected Discharge Date:                  Expected Discharge Plan:  Home/Self Care  In-House Referral:     Discharge planning Services  CM Consult  Post Acute Care Choice:    Choice offered to:     DME Arranged:    DME Agency:     HH Arranged:    HH Agency:     Status of Service:  In process, will continue to follow  Medicare Important Message Given:    Date Medicare IM Given:    Medicare IM give by:    Date Additional Medicare IM Given:    Additional Medicare Important Message give by:     If discussed at Combine of Stay Meetings, dates discussed:    Additional Comments:   CM spoke with daughter [name]) regarding discharge planning.  [name] states  mom  lives with mother and brother. Plan is for mom to care for self @ d/c with the assistance of family if needed.   [name] (Daughter)  [phone]  [name] [place], [place] [phone] 07/20/2015, 6:30 PM

## 2015-07-20 NOTE — Progress Notes (Signed)
PULMONARY / CRITICAL CARE MEDICINE   Name: [name] MRN: [id number] DOB: 1951/02/25    ADMISSION DATE:  07/19/2015 CONSULTATION DATE:  11/5  REFERRING MD :  FPTS  CHIEF COMPLAINT:  Respiratory failure, AMS   INITIAL PRESENTATION: 64yo female former smoker with hx COPD, HTN, HFpEF, previous hx PNA with respiratory failure requiring intubation 2015.  Presented 11/5 with progressive SOB over last 2 weeks, acutely worse in last 24 hours, as well as cough with yellow sputum.  Admitted by FPTS to SDU and was initially improved but mid-day 11/5 became increasingly lethargic.  ABG revealed significant hypercarbic respiratory failure with PCO2 >90 and pH 7.1.  Placed on bipap and PCCM consulted.   STUDIES:  CTA chest 11/5>>> no PE, mild edema, probable pulm HTN  SIGNIFICANT EVENTS:  SUBJECTIVE:   Feeling much better.  Awake, alert, interactive.  Wore bipap overnight intermittently.  Desats easily this morning.   VITAL SIGNS: Temp:  [97.5 F (36.4 C)-98.1 F (36.7 C)] 97.5 F (36.4 C) (11/06 0714) Pulse Rate:  [66-83] 74 (11/06 0302) Resp:  [14-30] 17 (11/06 0714) BP: (106-139)/(60-82) 130/82 mmHg (11/06 0714) SpO2:  [90 %-99 %] 96 % (11/06 0930) HEMODYNAMICS:   VENTILATOR SETTINGS:   INTAKE / OUTPUT:  Intake/Output Summary (Last 24 hours) at 07/20/15 1100 Last data filed at 07/20/15 0714  Gross per 24 hour  Intake    150 ml  Output    900 ml  Net   -750 ml    PHYSICAL EXAMINATION: General:  Chronically ill appearing female, NAD, uncooperative at times, argumentative Neuro: awake, alert, appropriate, arguing with nurse, MAE  HEENT:  Mm moist, no JVD Cardiovascular:  s1s2 rrr Lungs:  resps even, non labored on [place], coarse, bibasilar crackles, few exp wheeze  Abdomen:  Round, soft, non tender  Musculoskeletal:  Warm and dry, 1+ BLE edema   LABS:  CBC  Recent Labs Lab 07/19/15 0545 07/19/15 0549 07/19/15 0648 07/20/15 0223  WBC 8.1  --  12.0* 6.5  HGB 16.8* 12.2  9.9* 9.7*  HCT 48.5* 36.0 36.4 37.1  PLT 229  --  195 166   Coag's No results for input(s): APTT, INR in the last 168 hours. BMET  Recent Labs Lab 07/19/15 0545 07/19/15 0549 07/20/15 0223  NA 144 142 145  K 4.1 3.9 4.5  CL 103 98* 100*  CO2 34*  --  38*  BUN 31* 30* 22*  CREATININE 0.67 0.80 0.50  GLUCOSE 116* 119* 112*   Electrolytes  Recent Labs Lab 07/19/15 0545 07/20/15 0223  CALCIUM 8.1* 8.2*  MG  --  2.2  PHOS  --  4.3   Sepsis Markers No results for input(s): LATICACIDVEN, PROCALCITON, O2SATVEN in the last 168 hours. ABG  Recent Labs Lab 07/19/15 1300 07/19/15 2010 07/20/15 0120  PHART 7.230* 7.301* 7.309*  PCO2ART 85.0* 76.2* 74.3*  PO2ART 79.2* 65.1* 74.9*   Liver Enzymes No results for input(s): AST, ALT, ALKPHOS, BILITOT, ALBUMIN in the last 168 hours. Cardiac Enzymes  Recent Labs Lab 07/19/15 1120 07/19/15 1751 07/19/15 2205  TROPONINI <0.03 0.03 0.03   Glucose No results for input(s): GLUCAP in the last 168 hours.  Imaging Dg Chest Portable 1 View  07/20/2015  CLINICAL DATA:  64 year old female with acute respiratory failure. EXAM: PORTABLE CHEST 1 VIEW COMPARISON:  Chest x-ray 07/19/2015. FINDINGS: Lung volumes are slightly low. No consolidative airspace disease. No pleural effusions. Mild crowding of the pulmonary vasculature, without frank pulmonary edema. Mild cardiomegaly. Upper mediastinal  contours are within normal limits. Atherosclerosis in the thoracic aorta. Surgical clips project over the left axilla, related to prior left axillary lymph node dissection. IMPRESSION: 1. Cardiomegaly with pulmonary venous congestion. 2. Low lung volumes. 3. Atherosclerosis. Electronically Signed   By: Vinnie Langton M.D.   On: 07/20/2015 08:29     ASSESSMENT / PLAN:  PULMONARY Acute on chronic hypercarbic respiratory failure - r/t AECOPD +/- pulmonary edema +/- URI COPD  Respiratory acidosis  P:   bipap PRN IV solumedrol  BD's   Continue levaquin  F/u CXR  pulm hygiene  Repeat lasix 11/6  CARDIOVASCULAR Hx HFpEF - BNP elevated  Hx HTN  P:  Lasix x 1 11/6 F/u BNP  RENAL No active issue  P:   Monitor chem with lasix, levaquin   GASTROINTESTINAL No active issue  P:   Full liquid diet  PPI   HEMATOLOGIC No active issue  P:  F/u cbc  lovenox   INFECTIOUS AECOPD  No evidence CAP  P:   Levaquin 11/5>>>  Narrow quickly once resp status improves  Trend WBC, fever curve   ENDOCRINE No active issue  P:   Monitor glucose on chem   NEUROLOGIC AMS - r/t hypercarbia  P:   bipap as above  F/u ABG  Avoid sedating medications   FAMILY  - Updates:  No family available 11/6.  Updated pt at length.  She is very argumentative and insists that she does not have COPD or "any kind of lung or heart problem" and that the results of her PFT's were r/t ill-fitting dentures.  She states she will NOT wear oxygen at home under any circumstances nor will she take BD's.  Overall she is improved with near baseline mental status and no increased WOB.     Nickolas Madrid, NP 07/20/2015  11:00 AM Pager: [phone] or [phone]

## 2015-07-20 NOTE — Progress Notes (Signed)
UR COMPLETED  

## 2015-07-20 NOTE — Progress Notes (Signed)
Pt taken off Bipap and placed on 2L nasal cannula by repiratory.  Consuelo Pandy RN

## 2015-07-20 NOTE — Progress Notes (Signed)
Family Medicine Teaching Service Daily Progress Note Intern Pager: [phone]  Patient name: [name] Medical record number: [id number] Date of birth: 03-14-1951 Age: 64 y.o. Gender: female  Primary Care Provider: Ronnie Doss, DO Consultants: CCM Code Status: Full  Pt Overview and Major Events to Date:  11/5: patient admitted for AoC hypercarbic resp failure.  Assessment and Plan: [name] is a 64 y.o. female presenting with SOB. PMH is significant for Breast Cancer hx, COPD, HTN, Psoriasis, Macular Degeneration, HFpEF, Gastric Bypass.   #Acute on Chronic Hypercapnic Respiratory Failure: Improving; Patient with COPD and 40-pack-year hx. Does not take controller medications and does not believe that she has COPD. Patient admitted for CAP requiring intubation in 12/2013. DC'd on home oxygen but did not use. ABG showed acute resp acidosis with PH 7.3, PCO2 70.6, HCO3 34. CTA with trace right-sided pleural effusion, and minimal interlobular septal thickening, no concern for PE. Likely patient with acute respiratory failure due to untreated COPD with acute exacerbation. Patient had some improvement overnight. - Will initiate trials off BiPAP today >> low threshold for restarting - ABGs have been improving on BiPap; will repeat if status declines - Consult to CCM, appreciate recs - Levaquin, Solumedrol BID,Duonebs q4hr with prn albuterol for treatment of COPD exacerbation - Can transition to Doxycycline and Prednisone when able to consistently stay off BiPap - Continuous pulse oximetry - Will need further teaching regarding COPD and importance of medications before discharge  - Consider starting Spiriva or Advair prior to discharge  # Altered Mental Status: Likely related to hypercarbia in setting of acute respiratory failure. Improved after Bipap overnight.  - Monitor closely - BiPAP PRN  #HFpEF, Last Echo 12/2013 with EF 60-65% and Grade 1 Diastolic Dysfunction. On  admission 2+ pitting edema mid-calves with respiratory failure. EKG low voltage NSR, no evidence of ischemia. Respiratory status most likely 2/2 COPD exacerbation rather than volume overload.  - Consider ECHO if patient is refractory to current regimen - Troponins: neg x3 - BNP: 831.2 - Per CCM Lasix x 1 - f/u diuresis - Will monitor closely  #HTN BP 139/60. No home med  - Continue to monitor   # Abdominal fullness: Some notes in chart regarding patient complaints of abd fullness. KUB negative - Continue to monitor  #Hx Breast Cancer (1997). Patient had a DVT during 1997 admission for Breast CA    FEN/GI: SLIV; NPO until improvement in respiratory status Prophylaxis: Lovenox, PPI while NPO (also in steroid)  Disposition: pending medical stability  Subjective:  Patient was able to attempt trial off of BiPAP this morning. She continues to have some shortness of breath but states that she feels much better this morning. However, she states that she is very tired and was unable to sleep due to the BiPAP. She denies any other issues at this time.   Objective: Temp:  [97.5 F (36.4 C)-98.1 F (36.7 C)] 97.5 F (36.4 C) (11/06 0714) Pulse Rate:  [66-89] 87 (11/06 1200) Resp:  [17-30] 22 (11/06 1200) BP: (106-133)/(55-82) 126/68 mmHg (11/06 1200) SpO2:  [87 %-99 %] 89 % (11/06 1200) Physical Exam: General: Patient lying in bed, A/O, able to answer in complete sentences HEENT: PERRL, MMM (bordering tacky), OP clear  Cardiovascular: RRR, no murmurs noted, intact distal pulses Respiratory: Expiratory wheezes in the upper lobes, decreased breaths in the lower lobes, some increased WOB  MSK: 2+ pitting edema in LE  Laboratory:  Recent Labs Lab 07/19/15 0545 07/19/15 0549 07/19/15 3662 07/20/15 9476  WBC 8.1  --  12.0* 6.5  HGB 16.8* 12.2 9.9* 9.7*  HCT 48.5* 36.0 36.4 37.1  PLT 229  --  195 166    Recent Labs Lab 07/19/15 0545 07/19/15 0549 07/20/15 0223  NA 144 142  145  K 4.1 3.9 4.5  CL 103 98* 100*  CO2 34*  --  38*  BUN 31* 30* 22*  CREATININE 0.67 0.80 0.50  CALCIUM 8.1*  --  8.2*  GLUCOSE 116* 119* 112*   ABG    Component Value Date/Time   PHART 7.309* 07/20/2015 0120   PCO2ART 74.3* 07/20/2015 0120   PO2ART 74.9* 07/20/2015 0120   HCO3 36.2* 07/20/2015 0120   TCO2 38.5 07/20/2015 0120   O2SAT 93.8 07/20/2015 0120   A1c: pending BNP: 831.2  Imaging/Diagnostic Tests: CXR 11/6 IMPRESSION: 1. Cardiomegaly with pulmonary venous congestion. 2. Low lung volumes. 3. Atherosclerosis.  CTA 11/5 IMPRESSION: - Trace right-sided pleural effusion, and minimal interlobular septal thickening. This may reflect mild edema, and correlation with a history of CHF and current lab values may be useful. - Diffuse emphysema - Evidence of developing pulmonary hypertension, with enlarged main pulmonary artery and prominence of the distal pulmonary arteries of the bilateral lungs.   Elberta Leatherwood, MD 07/20/2015, 1:33 PM PGY-2, [place] Intern pager: [phone], text pages welcome

## 2015-07-20 NOTE — Progress Notes (Signed)
Called to see patient for AMS and Hypoxemia. On arrival, patient sleeping but easily awakened. Satting high 90-'s on BiPAP. Doesn't feel different. Edema on exam.   RN reports concern that patient desatting more this evening than prior. None documented in medical record. Encouraged primary team to come to see sat waveform during desat events. Can also consider component of OSA in additional to OHS/COPD. For now, agree with recommendations from consult earlier yesterday - Rx for COPD and Diurese. Discussed with house staff relatively little urine put out by patient thus far.

## 2015-07-20 NOTE — Progress Notes (Signed)
Pt refuses lovenox, Curt Bears Np updated at bedside. Pt states she does not want it. Educated pt on importance of lovenox and preventing blood clots. Pt states she understands. Consuelo Pandy RN

## 2015-07-20 NOTE — Progress Notes (Signed)
RN called critical ABG value during downtime at 0138 on 07/20/2015. pH7.30, pCO2 74, paO2 74, bicarb 36.

## 2015-07-21 DIAGNOSIS — J209 Acute bronchitis, unspecified: Secondary | ICD-10-CM

## 2015-07-21 LAB — BRAIN NATRIURETIC PEPTIDE: B Natriuretic Peptide: 407.4 pg/mL — ABNORMAL HIGH (ref 0.0–100.0)

## 2015-07-21 LAB — HEMOGLOBIN A1C
Hgb A1c MFr Bld: 6.1 % — ABNORMAL HIGH (ref 4.8–5.6)
Mean Plasma Glucose: 128 mg/dL

## 2015-07-21 MED ORDER — PANTOPRAZOLE SODIUM 40 MG PO TBEC
40.0000 mg | DELAYED_RELEASE_TABLET | Freq: Every day | ORAL | Status: DC
  Administered 2015-07-22 – 2015-07-24 (×3): 40 mg via ORAL
  Filled 2015-07-21 (×3): qty 1

## 2015-07-21 MED ORDER — PREDNISONE 50 MG PO TABS
50.0000 mg | ORAL_TABLET | Freq: Every day | ORAL | Status: DC
  Administered 2015-07-22 – 2015-07-24 (×3): 50 mg via ORAL
  Filled 2015-07-21 (×4): qty 1

## 2015-07-21 MED ORDER — IPRATROPIUM-ALBUTEROL 0.5-2.5 (3) MG/3ML IN SOLN: 3.0000 mL | RESPIRATORY_TRACT | Status: DC | PRN

## 2015-07-21 MED ORDER — DOXYCYCLINE HYCLATE 100 MG PO TABS
100.0000 mg | ORAL_TABLET | Freq: Two times a day (BID) | ORAL | Status: DC
  Administered 2015-07-21 – 2015-07-24 (×7): 100 mg via ORAL
  Filled 2015-07-21 (×8): qty 1

## 2015-07-21 MED ORDER — TIOTROPIUM BROMIDE MONOHYDRATE 18 MCG IN CAPS
18.0000 ug | ORAL_CAPSULE | Freq: Every day | RESPIRATORY_TRACT | Status: DC
  Administered 2015-07-21 – 2015-07-24 (×3): 18 ug via RESPIRATORY_TRACT
  Filled 2015-07-21 (×2): qty 5

## 2015-07-21 MED ORDER — MOMETASONE FURO-FORMOTEROL FUM 100-5 MCG/ACT IN AERO
2.0000 | INHALATION_SPRAY | Freq: Two times a day (BID) | RESPIRATORY_TRACT | Status: DC
  Administered 2015-07-21 – 2015-07-24 (×6): 2 via RESPIRATORY_TRACT
  Filled 2015-07-21: qty 8.8

## 2015-07-21 NOTE — Progress Notes (Signed)
PULMONARY / CRITICAL CARE MEDICINE   Name: [name] MRN: [id number] DOB: 1951/02/09    ADMISSION DATE:  07/19/2015 CONSULTATION DATE:  11/5  REFERRING MD :  FPTS  CHIEF COMPLAINT:  Respiratory failure, AMS   INITIAL PRESENTATION: 64yo female former smoker with hx COPD, HTN, HFpEF, previous hx PNA with respiratory failure requiring intubation 2015.  Presented 11/5 with progressive SOB over last 2 weeks, acutely worse in last 24 hours, as well as cough with yellow sputum.  Admitted by FPTS to SDU and was initially improved but mid-day 11/5 became increasingly lethargic.  ABG revealed significant hypercarbic respiratory failure with PCO2 >90 and pH 7.1.  Placed on bipap and PCCM consulted.   STUDIES:  CTA chest 11/5>>> no PE, mild edema, probable pulm HTN  SIGNIFICANT EVENTS:  SUBJECTIVE:   Refused bipap overnight ("could not sleep").  Overall much improved. Still refuses to believe that she has COPD. Says she will not wear O2 at home.    VITAL SIGNS: Temp:  [98 F (36.7 C)-99.2 F (37.3 C)] 98 F (36.7 C) (11/07 0744) Pulse Rate:  [66-89] 80 (11/07 0800) Resp:  [18-26] 19 (11/07 0800) BP: (121-139)/(55-71) 136/66 mmHg (11/07 0800) SpO2:  [87 %-96 %] 90 % (11/07 0800) FiO2 (%):  [35 %] 35 % (11/07 0058)  INTAKE / OUTPUT:  Intake/Output Summary (Last 24 hours) at 07/21/15 1023 Last data filed at 07/21/15 0900  Gross per 24 hour  Intake   1320 ml  Output    575 ml  Net    745 ml    PHYSICAL EXAMINATION: General:  Chronically ill appearing female, NAD, argumentative at times but overall oriented, appropriate Neuro: awake, alert, appropriate, MAE  HEENT:  Mm moist, no JVD Cardiovascular:  s1s2 rrr Lungs:  resps even, non labored on [place], coarse,  few exp wheeze  Abdomen:  Round, soft, non tender  Musculoskeletal:  Warm and dry, scant BLE edema   LABS:  CBC  Recent Labs Lab 07/19/15 0545 07/19/15 0549 07/19/15 0648 07/20/15 0223  WBC 8.1  --  12.0* 6.5  HGB  16.8* 12.2 9.9* 9.7*  HCT 48.5* 36.0 36.4 37.1  PLT 229  --  195 166   Coag's No results for input(s): APTT, INR in the last 168 hours. BMET  Recent Labs Lab 07/19/15 0545 07/19/15 0549 07/20/15 0223  NA 144 142 145  K 4.1 3.9 4.5  CL 103 98* 100*  CO2 34*  --  38*  BUN 31* 30* 22*  CREATININE 0.67 0.80 0.50  GLUCOSE 116* 119* 112*   Electrolytes  Recent Labs Lab 07/19/15 0545 07/20/15 0223  CALCIUM 8.1* 8.2*  MG  --  2.2  PHOS  --  4.3   Sepsis Markers No results for input(s): LATICACIDVEN, PROCALCITON, O2SATVEN in the last 168 hours. ABG  Recent Labs Lab 07/19/15 1300 07/19/15 2010 07/20/15 0120  PHART 7.230* 7.301* 7.309*  PCO2ART 85.0* 76.2* 74.3*  PO2ART 79.2* 65.1* 74.9*   Liver Enzymes No results for input(s): AST, ALT, ALKPHOS, BILITOT, ALBUMIN in the last 168 hours. Cardiac Enzymes  Recent Labs Lab 07/19/15 1120 07/19/15 1751 07/19/15 2205  TROPONINI <0.03 0.03 0.03   Glucose No results for input(s): GLUCAP in the last 168 hours.  Imaging No results found.   ASSESSMENT / PLAN:  PULMONARY Acute on chronic hypercarbic respiratory failure - r/t AECOPD +/- pulmonary edema +/- URI COPD  Respiratory acidosis  P:   D/c qhs bipap - pt not wearing  Change solumedrol  to PO prednisone with taper over 2 weeks  BD's  - need to assess cost prior to prescribing home meds - case management assisting  Continue levaquin  F/u CXR  pulm hygiene  outpt pulm f/u  Ambulatory desat prior to d/c - suspect needs home O2 but she has been adamant that she will not wear it  CARDIOVASCULAR Hx HFpEF - BNP elevated  Hx HTN  P:  F/u BNP outpt cards f/u  RENAL No active issue  P:   Monitor chem   GASTROINTESTINAL No active issue  P:   Advance diet  PPI   HEMATOLOGIC No active issue  P:  F/u cbc  lovenox   INFECTIOUS AECOPD  No evidence CAP  P:   Levaquin 11/5>>>  Narrow quickly once resp status improves  Trend WBC, fever curve    ENDOCRINE No active issue  P:   Monitor glucose on chem   NEUROLOGIC AMS - r/t hypercarbia  P:   bipap PRN  F/u ABG  Avoid sedating medications     Nickolas Madrid, NP 07/21/2015  10:23 AM Pager: [phone] or [phone]

## 2015-07-21 NOTE — Progress Notes (Signed)
Pt removed from bipap at this time.  Pt had not slept since being placed on bipap and asked to be removed.  Pt is coherent and following commands.  RT will continue to monitor.

## 2015-07-21 NOTE — Progress Notes (Signed)
Family Medicine Teaching Service Daily Progress Note Intern Pager: [phone]  Patient name: [name] Medical record number: [id number] Date of birth: 09-Mar-1951 Age: 64 y.o. Gender: female  Primary Care Provider: Ronnie Doss, DO Consultants: CCM Code Status: Full  Pt Overview and Major Events to Date:  11/5: patient admitted for AoC hypercarbic resp failure.  Assessment and Plan: [name] is a 64 y.o. female presenting with SOB. PMH is significant for Breast Cancer hx, COPD, HTN, Psoriasis, Macular Degeneration, HFpEF, Gastric Bypass.   #Acute on Chronic Hypercapnic Respiratory Failure: Improving, feels ready for home. 5L [place]. Patient with COPD and 40-pack-year hx. Does not take controller medications and does not believe that she has COPD. Patient admitted for CAP requiring intubation in 12/2013. DC'd on home oxygen but did not use. ABG showed acute resp acidosis with PH 7.3, PCO2 70.6, HCO3 34. CTA with trace right-sided pleural effusion, and minimal interlobular septal thickening, no concern for PE. CXR 11/6 showing pulmonary congestion and decreased lung volumes. - Admitted requiring BiPAP for 24 hours,  BiPAP 1 AM this morning. Then transitioned on to  5 Liters [place] sats of 92% on pulse oximetry - CCM  Indicates patient should have an outpatient sleep study - ContinueDuonebs q6hr - Transition PRN  - Consider Dulera and Spiriva for control  - Transitioned to  Doxycycline and Prednisone - Continuous pulse oximetry - Will need further teaching regarding COPD and importance of medications before discharge   #HFpEF: Last Echo 12/2013 with EF 60-65% and Grade 1 Diastolic Dysfunction. 1+ Pitting edema to mid-calf   EKG low voltage NSR, no evidence of ischemia. Troponins: neg x3. BNP: 831.2>407.4  Respiratory status most likely 2/2 COPD exacerbation rather than volume overload.  - ECHO to determine heart function  - s/p  Lasix 40 mg IV on 07/19/2015 and 11/6  - Will monitor  closely  #HTN BP 139/71 No home med  - Continue to monitor   #Hx Breast Cancer (1997). Patient had a DVT during 1997 admission for Breast CA   FEN/GI: SLIV; NPO until improvement in respiratory status Prophylaxis: Lovenox, PPI while NPO (also in steroid)  Disposition: pending medical stability  Subjective:  Patient doing well this AM. Denies any dyspnea this morning. States that she feels ready to go home   Objective: Temp:  [98.1 F (36.7 C)-99.2 F (37.3 C)] 98.3 F (36.8 C) (11/07 0303) Pulse Rate:  [66-89] 80 (11/07 0400) Resp:  [18-26] 23 (11/07 0400) BP: (121-139)/(55-71) 139/71 mmHg (11/07 0400) SpO2:  [87 %-98 %] 93 % (11/07 0400) FiO2 (%):  [35 %] 35 % (11/07 0058) Physical Exam: General: Patient lying in bed, A/O, NAD  Cardiovascular: RRR, no murmurs noted, intact distal pulses Respiratory: CTAB, normal WOB MSK: 1+ pitting edema in LE  Laboratory:  Recent Labs Lab 07/19/15 0545 07/19/15 0549 07/19/15 0648 07/20/15 0223  WBC 8.1  --  12.0* 6.5  HGB 16.8* 12.2 9.9* 9.7*  HCT 48.5* 36.0 36.4 37.1  PLT 229  --  195 166    Recent Labs Lab 07/19/15 0545 07/19/15 0549 07/20/15 0223  NA 144 142 145  K 4.1 3.9 4.5  CL 103 98* 100*  CO2 34*  --  38*  BUN 31* 30* 22*  CREATININE 0.67 0.80 0.50  CALCIUM 8.1*  --  8.2*  GLUCOSE 116* 119* 112*   ABG    Component Value Date/Time   PHART 7.309* 07/20/2015 0120   PCO2ART 74.3* 07/20/2015 0120   PO2ART 74.9* 07/20/2015 0120  HCO3 36.2* 07/20/2015 0120   TCO2 38.5 07/20/2015 0120   O2SAT 93.8 07/20/2015 0120   A1c: pending BNP: 831.2>407.4  Imaging/Diagnostic Tests:  Ct Angio Chest Pe W/cm &/or Wo Cm  07/19/2015  CLINICAL DATA:  64 year old female with a history of short of breath EXAM: CT ANGIOGRAPHY CHEST WITH CONTRAST TECHNIQUE: Multidetector CT imaging of the chest was performed using the standard protocol during bolus administration of intravenous contrast. Multiplanar CT image reconstructions  and MIPs were obtained to evaluate the vascular anatomy. CONTRAST:  128mL OMNIPAQUE IOHEXOL 350 MG/ML SOLN COMPARISON:  None. FINDINGS: Chest: Unremarkable appearance of the chest superficial soft tissues. No axillary or supraclavicular adenopathy. Surgical clips within the left axilla. Unremarkable appearance of the thoracic inlet, including the visualized thyroid. Several mediastinal lymph nodes, none of which are enlarged by CT size criteria or have suspicious features. Unremarkable appearance of the esophagus. Unremarkable course caliber and contour of the thoracic aorta without dissection flap, aneurysm, periaortic fluid. No central, lobar, segmental, or proximal subsegmental filling defects to indicate pulmonary emboli. Transverse diameter of the main pulmonary artery measures 4.2 cm. There is prominence of the pulmonary arteries as they extend to the peripheral 1/3 of the lungs, and they are greater in diameter than the accompanying bronchi at the periphery. Heart size borderline enlarged. Trace pericardial fluid/ thickening, particularly anteriorly. Calcifications of the left main and circumflex coronary arteries. Diffuse centrilobular and paraseptal emphysema. No confluent airspace disease. Small right pleural effusion. Mixed ground-glass and nodular opacity in the dependent lung bases, more pronounced on the left. Trace interlobular septal thickening, more pronounced at the superior aspects of the lungs. Upper abdomen: Surgical changes of gastric bypass.  Small hiatal hernia. Review of the MIP images confirms the above findings. IMPRESSION: Trace right-sided pleural effusion, and minimal interlobular septal thickening. This may reflect mild edema, and correlation with a history of CHF and current lab values may be useful. Diffuse emphysema Evidence of developing pulmonary hypertension, with enlarged main pulmonary artery and prominence of the distal pulmonary arteries of the bilateral lungs. If not already  performed, referral for pulmonary evaluation may be useful. Signed, Dulcy Fanny. Earleen Newport, DO [facility] Radiology Electronically Signed   By: Corrie Mckusick D.O.   On: 07/19/2015 08:08   Dg Chest Portable 1 View  07/20/2015  CLINICAL DATA:  64 year old female with acute respiratory failure. EXAM: PORTABLE CHEST 1 VIEW COMPARISON:  Chest x-ray 07/19/2015. FINDINGS: Lung volumes are slightly low. No consolidative airspace disease. No pleural effusions. Mild crowding of the pulmonary vasculature, without frank pulmonary edema. Mild cardiomegaly. Upper mediastinal contours are within normal limits. Atherosclerosis in the thoracic aorta. Surgical clips project over the left axilla, related to prior left axillary lymph node dissection. IMPRESSION: 1. Cardiomegaly with pulmonary venous congestion. 2. Low lung volumes. 3. Atherosclerosis. Electronically Signed   By: Vinnie Langton M.D.   On: 07/20/2015 08:29   Dg Abd Acute W/chest  07/19/2015  CLINICAL DATA:  Dyspnea. Hypoxia. Central chest and abdominal pain for 2 weeks. EXAM: DG ABDOMEN ACUTE W/ 1V CHEST COMPARISON:  02/05/2014 FINDINGS: There is no evidence of dilated bowel loops or free intraperitoneal air. No radiopaque calculi or other significant radiographic abnormality is seen. Heart size and mediastinal contours are within normal limits. Both lungs are clear. IMPRESSION: Negative abdominal radiographs.  No acute cardiopulmonary disease. Electronically Signed   By: Andreas Newport M.D.   On: 07/19/2015 06:11    Vidhi Delellis Cletis Media, MD 07/21/2015, 7:28 AM PGY-1,  [place] Intern pager: [phone], text pages welcome

## 2015-07-21 NOTE — Progress Notes (Signed)
Pt states she does not want to wear BiPAP tonight.

## 2015-07-22 ENCOUNTER — Inpatient Hospital Stay (HOSPITAL_COMMUNITY): Payer: Medicare Other

## 2015-07-22 ENCOUNTER — Other Ambulatory Visit: Payer: Self-pay | Admitting: Family Medicine

## 2015-07-22 DIAGNOSIS — R0600 Dyspnea, unspecified: Secondary | ICD-10-CM

## 2015-07-22 LAB — CBC
HCT: 37.4 % (ref 36.0–46.0)
Hemoglobin: 10 g/dL — ABNORMAL LOW (ref 12.0–15.0)
MCH: 24.3 pg — ABNORMAL LOW (ref 26.0–34.0)
MCHC: 26.7 g/dL — ABNORMAL LOW (ref 30.0–36.0)
MCV: 91 fL (ref 78.0–100.0)
Platelets: 141 10*3/uL — ABNORMAL LOW (ref 150–400)
RBC: 4.11 MIL/uL (ref 3.87–5.11)
RDW: 17.8 % — ABNORMAL HIGH (ref 11.5–15.5)
WBC: 7.5 10*3/uL (ref 4.0–10.5)

## 2015-07-22 LAB — BASIC METABOLIC PANEL
Anion gap: 9 (ref 5–15)
BUN: 19 mg/dL (ref 6–20)
CO2: 35 mmol/L — ABNORMAL HIGH (ref 22–32)
Calcium: 8 mg/dL — ABNORMAL LOW (ref 8.9–10.3)
Chloride: 98 mmol/L — ABNORMAL LOW (ref 101–111)
Creatinine, Ser: 0.54 mg/dL (ref 0.44–1.00)
GFR calc Af Amer: >60 mL/min (ref >60)
GFR calc non Af Amer: >60 mL/min (ref >60)
Glucose, Bld: 108 mg/dL — ABNORMAL HIGH (ref 65–99)
Potassium: 4.5 mmol/L (ref 3.5–5.1)
Sodium: 142 mmol/L (ref 135–145)

## 2015-07-22 MED ORDER — TIOTROPIUM BROMIDE MONOHYDRATE 18 MCG IN CAPS: 18.0000 ug | ORAL_CAPSULE | Freq: Every day | RESPIRATORY_TRACT | Status: DC

## 2015-07-22 MED ORDER — FUROSEMIDE 10 MG/ML IJ SOLN
40.0000 mg | Freq: Once | INTRAMUSCULAR | Status: AC
  Administered 2015-07-22: 40 mg via INTRAVENOUS
  Filled 2015-07-22: qty 4

## 2015-07-22 MED FILL — Albuterol Sulfate Soln Nebu 0.083% (2.5 MG/3ML): RESPIRATORY_TRACT | Qty: 3 | Status: AC

## 2015-07-22 NOTE — Consult Note (Signed)
   [facility] CM Inpatient Consult   07/22/2015  Ladesha Pacini [birth year] [phone]   Patient evaluated for [place] Management services. She is currently on the phone. Will come back at later time. Inpatient RNCM aware.  Marthenia Rolling, MSN-Ed, RN,BSN [facility] Liaison [phone]

## 2015-07-22 NOTE — Progress Notes (Signed)
SATURATION QUALIFICATIONS: (This note is used to comply with regulatory documentation for home oxygen)  Patient Saturations on Room Air at Rest = 75%  Patient Saturations on Room Air while Ambulating = 63%  Patient Saturations on 6 Liters of oxygen while Ambulating = 88%  Please briefly explain why patient needs home oxygen: Oxygen saturations not maintained on room air while ambulating.

## 2015-07-22 NOTE — Progress Notes (Addendum)
Report called to Earlie Server, RN on 5W. Patient to be transferred to 5W32 via wheelchair on oxygen by Vickii Chafe, NT. Belongings sent with patient. No family at bedside.

## 2015-07-22 NOTE — Care Management Important Message (Signed)
Important Message  Patient Details  Name: [name] MRN: [id number] Date of Birth: Nov 12, 1950   Medicare Important Message Given:  Yes-second notification given    Nathen May 07/22/2015, 10:25 AM

## 2015-07-22 NOTE — Progress Notes (Signed)
  Echocardiogram 2D Echocardiogram has been performed.  [name] 07/22/2015, 12:03 PM

## 2015-07-22 NOTE — Progress Notes (Signed)
Received patient from 3S stable on oxygen at 3l [place]. Denies SOB at this time.

## 2015-07-22 NOTE — Progress Notes (Signed)
Family Medicine Teaching Service Daily Progress Note Intern Pager: [phone]  Patient name: [name] Medical record number: [id number] Date of birth: 24-Feb-1951 Age: 64 y.o. Gender: female  Primary Care Provider: Ronnie Doss, DO Consultants: CCM Code Status: Full  Pt Overview and Major Events to Date:  11/5: patient admitted for AoC hypercarbic resp failure.  Assessment and Plan: [name] is a 64 y.o. female presenting with SOB. PMH is significant for Breast Cancer hx, COPD, HTN, Psoriasis, Macular Degeneration, HFpEF, Gastric Bypass.   #Acute on Chronic Hypercapnic Respiratory Failure 2/2 COPD Exacerbation: Improving, feels ready for home. 5L [place]. Patient with COPD and 40-pack-year hx. Does not take controller medications and does not believe that she has COPD. Patient admitted for CAP requiring intubation in 12/2013. DC'd on home oxygen but did not use. ABG showed acute resp acidosis with PH 7.3, PCO2 70.6, HCO3 34. CTA with trace right-sided pleural effusion, and minimal interlobular septal thickening, no concern for PE. CXR 11/6 showing pulmonary congestion and decreased lung volumes. - On 2L  with good saturations - CCM  Indicates patient should have an outpatient sleep study; Recommend a two-week taper of prednisone; recommend 10 days total abx for acute bronchitis; Recommend continuing to wean FiO2 for saturation 88-92%; Patient will need an outpatient follow-up with pulmonary within 2 weeks of discharge (signed off)  - ContinueDuonebs q6hr - Transitioned PRN 11/7 - Consider Dulera and Spiriva for control  - Transitioned to  Doxycycline and Prednisone - Albuterol PRN  - Continuous pulse oximetry - Will need further teaching regarding COPD and importance of medications before discharge  - will need to ambulate to determine if pt needs home O2  #HFpEF: Last Echo 12/2013 with EF 60-65% and Grade 1 Diastolic Dysfunction. 1+ Pitting edema to mid-calf. BNP 407.4 (11/7).  Weight: 230>311> 256lboday. Urine output 600cc, with net +_270 since admission  - EKG low voltage NSR, no evidence of ischemia. Troponins: neg x3. BNP: 831.2>407.4  Respiratory status most likely 2/2 COPD exacerbation rather than volume overload.  - ECHO to determine heart function  - s/p  Lasix 40 mg IV on 07/19/2015  - Lasix 40mg  IV today  - Will monitor closely  #HTN BP stable. No home med  - Continue to monitor   #Hx Breast Cancer (1997). Patient had a DVT during 1997 admission for Breast CA   FEN/GI: SLIV; NPO until improvement in respiratory status Prophylaxis: Lovenox (patient and family refused); full diet  Disposition: pending medical stability  Subjective:  - denies shortness of breath or wheezing, cough is improving   Objective: Temp:  [97.9 F (36.6 C)-98.6 F (37 C)] 98 F (36.7 C) (11/08 0414) Pulse Rate:  [71-80] 71 (11/08 0416) Resp:  [19-25] 20 (11/08 0416) BP: (114-136)/(55-74) 114/55 mmHg (11/08 0416) SpO2:  [90 %-98 %] 98 % (11/08 0416) Weight:  [256 lb 2.8 oz (116.2 kg)-311 lb 1.1 oz (141.1 kg)] 256 lb 2.8 oz (116.2 kg) (11/08 0416) Physical Exam: General: Patient lying in bed, A/O, NAD  Cardiovascular: RRR, no murmurs noted, intact distal pulses Respiratory: CTAB, normal WOB MSK: 1+ pitting edema in LE  Laboratory:  Recent Labs Lab 07/19/15 0545 07/19/15 0549 07/19/15 0648 07/20/15 0223  WBC 8.1  --  12.0* 6.5  HGB 16.8* 12.2 9.9* 9.7*  HCT 48.5* 36.0 36.4 37.1  PLT 229  --  195 166    Recent Labs Lab 07/19/15 0545 07/19/15 0549 07/20/15 0223  NA 144 142 145  K 4.1 3.9 4.5  CL  103 98* 100*  CO2 34*  --  38*  BUN 31* 30* 22*  CREATININE 0.67 0.80 0.50  CALCIUM 8.1*  --  8.2*  GLUCOSE 116* 119* 112*   ABG    Component Value Date/Time   PHART 7.309* 07/20/2015 0120   PCO2ART 74.3* 07/20/2015 0120   PO2ART 74.9* 07/20/2015 0120   HCO3 36.2* 07/20/2015 0120   TCO2 38.5 07/20/2015 0120   O2SAT 93.8 07/20/2015 0120   A1c:  pending BNP: 831.2>407.4  Imaging/Diagnostic Tests:  Ct Angio Chest Pe W/cm &/or Wo Cm  07/19/2015  CLINICAL DATA:  64 year old female with a history of short of breath EXAM: CT ANGIOGRAPHY CHEST WITH CONTRAST TECHNIQUE: Multidetector CT imaging of the chest was performed using the standard protocol during bolus administration of intravenous contrast. Multiplanar CT image reconstructions and MIPs were obtained to evaluate the vascular anatomy. CONTRAST:  166mL OMNIPAQUE IOHEXOL 350 MG/ML SOLN COMPARISON:  None. FINDINGS: Chest: Unremarkable appearance of the chest superficial soft tissues. No axillary or supraclavicular adenopathy. Surgical clips within the left axilla. Unremarkable appearance of the thoracic inlet, including the visualized thyroid. Several mediastinal lymph nodes, none of which are enlarged by CT size criteria or have suspicious features. Unremarkable appearance of the esophagus. Unremarkable course caliber and contour of the thoracic aorta without dissection flap, aneurysm, periaortic fluid. No central, lobar, segmental, or proximal subsegmental filling defects to indicate pulmonary emboli. Transverse diameter of the main pulmonary artery measures 4.2 cm. There is prominence of the pulmonary arteries as they extend to the peripheral 1/3 of the lungs, and they are greater in diameter than the accompanying bronchi at the periphery. Heart size borderline enlarged. Trace pericardial fluid/ thickening, particularly anteriorly. Calcifications of the left main and circumflex coronary arteries. Diffuse centrilobular and paraseptal emphysema. No confluent airspace disease. Small right pleural effusion. Mixed ground-glass and nodular opacity in the dependent lung bases, more pronounced on the left. Trace interlobular septal thickening, more pronounced at the superior aspects of the lungs. Upper abdomen: Surgical changes of gastric bypass.  Small hiatal hernia. Review of the MIP images confirms the  above findings. IMPRESSION: Trace right-sided pleural effusion, and minimal interlobular septal thickening. This may reflect mild edema, and correlation with a history of CHF and current lab values may be useful. Diffuse emphysema Evidence of developing pulmonary hypertension, with enlarged main pulmonary artery and prominence of the distal pulmonary arteries of the bilateral lungs. If not already performed, referral for pulmonary evaluation may be useful. Signed, Dulcy Fanny. Earleen Newport, DO [facility] Electronically Signed   By: Corrie Mckusick D.O.   On: 07/19/2015 08:08   Dg Chest Portable 1 View  07/20/2015  CLINICAL DATA:  64 year old female with acute respiratory failure. EXAM: PORTABLE CHEST 1 VIEW COMPARISON:  Chest x-ray 07/19/2015. FINDINGS: Lung volumes are slightly low. No consolidative airspace disease. No pleural effusions. Mild crowding of the pulmonary vasculature, without frank pulmonary edema. Mild cardiomegaly. Upper mediastinal contours are within normal limits. Atherosclerosis in the thoracic aorta. Surgical clips project over the left axilla, related to prior left axillary lymph node dissection. IMPRESSION: 1. Cardiomegaly with pulmonary venous congestion. 2. Low lung volumes. 3. Atherosclerosis. Electronically Signed   By: Vinnie Langton M.D.   On: 07/20/2015 08:29   Dg Abd Acute W/chest  07/19/2015  CLINICAL DATA:  Dyspnea. Hypoxia. Central chest and abdominal pain for 2 weeks. EXAM: DG ABDOMEN ACUTE W/ 1V CHEST COMPARISON:  02/05/2014 FINDINGS: There is no evidence of dilated bowel loops  or free intraperitoneal air. No radiopaque calculi or other significant radiographic abnormality is seen. Heart size and mediastinal contours are within normal limits. Both lungs are clear. IMPRESSION: Negative abdominal radiographs.  No acute cardiopulmonary disease. Electronically Signed   By: Andreas Newport M.D.   On: 07/19/2015 06:11     Smiley Houseman, MD 07/22/2015, 7:25 AM PGY-1, [place] Intern pager: [phone], text pages welcome

## 2015-07-23 DIAGNOSIS — I5030 Unspecified diastolic (congestive) heart failure: Secondary | ICD-10-CM | POA: Insufficient documentation

## 2015-07-23 LAB — BASIC METABOLIC PANEL
Anion gap: 4 — ABNORMAL LOW (ref 5–15)
BUN: 13 mg/dL (ref 6–20)
CO2: 43 mmol/L — ABNORMAL HIGH (ref 22–32)
Calcium: 7.9 mg/dL — ABNORMAL LOW (ref 8.9–10.3)
Chloride: 95 mmol/L — ABNORMAL LOW (ref 101–111)
Creatinine, Ser: 0.54 mg/dL (ref 0.44–1.00)
GFR calc Af Amer: >60 mL/min (ref >60)
GFR calc non Af Amer: >60 mL/min (ref >60)
Glucose, Bld: 100 mg/dL — ABNORMAL HIGH (ref 65–99)
Potassium: 4 mmol/L (ref 3.5–5.1)
Sodium: 142 mmol/L (ref 135–145)

## 2015-07-23 LAB — CBC
HCT: 40.2 % (ref 36.0–46.0)
Hemoglobin: 10.4 g/dL — ABNORMAL LOW (ref 12.0–15.0)
MCH: 23.3 pg — ABNORMAL LOW (ref 26.0–34.0)
MCHC: 25.9 g/dL — ABNORMAL LOW (ref 30.0–36.0)
MCV: 89.9 fL (ref 78.0–100.0)
Platelets: 143 10*3/uL — ABNORMAL LOW (ref 150–400)
RBC: 4.47 MIL/uL (ref 3.87–5.11)
RDW: 17.2 % — ABNORMAL HIGH (ref 11.5–15.5)
WBC: 7.4 10*3/uL (ref 4.0–10.5)

## 2015-07-23 MED ORDER — PANTOPRAZOLE SODIUM 40 MG PO TBEC: 40.0000 mg | DELAYED_RELEASE_TABLET | Freq: Every day | ORAL | Status: DC

## 2015-07-23 MED ORDER — DOXYCYCLINE HYCLATE 100 MG PO TABS: 100.0000 mg | ORAL_TABLET | Freq: Two times a day (BID) | ORAL | Status: DC

## 2015-07-23 MED ORDER — PREDNISONE 10 MG PO TABS: ORAL_TABLET | ORAL | Status: DC

## 2015-07-23 MED ORDER — PREDNISONE 50 MG PO TABS: ORAL_TABLET | ORAL | Status: DC

## 2015-07-23 MED ORDER — MOMETASONE FURO-FORMOTEROL FUM 100-5 MCG/ACT IN AERO: 2.0000 | INHALATION_SPRAY | Freq: Two times a day (BID) | RESPIRATORY_TRACT | Status: DC

## 2015-07-23 NOTE — Progress Notes (Signed)
FPTS Interim Progress Note  S: Re-evaluated patient this afternoon prior to potential discharge.  Earlier in afternoon today 11/9 patient had ambulated with PT to determine potential discharge home O2 req. She had significant desaturations (see note for details) ultimately req 4L continuous O2 to maintain at rest, and 6L continuous O2 via [place] to maintain 88-89% on ambulation, also reported to be mildly short of breath and fatigued on ambulation.  Talking to patient now, she reports feeling better today, but did admit to feeling a little fatigued and SOB with the ambulation. Currently denies any symptoms while resting with 4L [place] on. Denies CP, SOB, cough. She has good family support at home, daughter and mother present in room, all questions answered. They were appreciative of Dulera sample and Spiriva med-assist plan. I updated them on upcoming hospital follow-up apts with   O: BP 127/67 mmHg  Pulse 70  Temp(Src) 98.2 F (36.8 C) (Oral)  Resp 18  Ht 5\' 5"  (1.651 m)  Wt 246 lb 14.6 oz (112 kg)  BMI 41.09 kg/m2  SpO2 90%   Gen - Sitting up in bed, comfortable, speaks full sentences HEENT - 4L [place] in place Heart - RRR, no murmurs Lungs - CTAB, improved air movement but still diminished at bases with reduced resp effort, no crackles or wheezing appreciated. Speaks full sentences. No resp distress or tachypnea Skin - warm, dry Neuro - awake, alert, oriented  A/P: [name] 64 yr Female admitted for AECOPD.  #Acute on Chronic Hypercapnic Respiratory Failure 2/2 COPD Exacerbation - Improving Considered potential discharge to home with supplemental oxygen, however following ambulation pulse ox testing with PT, req 4L at rest and 6L continuous with ambulation. Concerned by seemingly high req (and new req as previously not on home O2). - Called Pulmonology (previously consulted, for advice) spoke with Dr Ashok Cordia who advised that given patient's symptoms with some dyspnea on ambulation and high  O2 req, would agree with plan to keep patient at least 1 more day for further pulmonary mobilization, ambulation, incentive spiro. - Continue Dulera, Spiriva, nebs PRN - Continue current course Doxycycline and Prednisone - Ordered DME home health O2 (patient has in room already), specified "continuous O2 via [place] for 4L at rest and up 6L on ambulation". Also requested for home health to assess at home for a Portable Oxygen Concentrator. - Scheduled hosp [facility] at [facility] next 11/14 and scheduled hosp follow-up with [facility] (Dr Melvyn Novas on 11/25). Family notified of apts  Dispo: Anticipate discharge to home in 1-2 days, pending clinical improvement, perhaps reduced O2 req  Olin Hauser, DO 07/23/2015, 3:58 PM PGY-3, [place] Service pager [phone]

## 2015-07-23 NOTE — Evaluation (Signed)
Physical Therapy Evaluation Patient Details Name: [name] MRN: [id number] DOB: 06-Dec-1950 Today's Date: 07/23/2015   History of Present Illness  Pt adm with COPD exacerbation. PMH - Lt TKA, breast CA, HTN  Clinical Impression  Pt admitted with above diagnosis and presents to PT with functional limitations due to deficits listed below (See PT problem list). Pt needs skilled PT to maximize independence and safety to allow discharge to home. No PT recommended after DC.      Follow Up Recommendations No PT follow up    Equipment Recommendations  None recommended by PT    Recommendations for Other Services       Precautions / Restrictions Precautions Precautions: Other (comment) Precaution Comments: decr SpO2 Restrictions Weight Bearing Restrictions: No      Mobility  Bed Mobility Overal bed mobility: Modified Independent                Transfers Overall transfer level: Modified independent                  Ambulation/Gait Ambulation/Gait assistance: Supervision Ambulation Distance (Feet): 225 Feet Assistive device: 4-wheeled walker;Rolling walker (2 wheeled) Gait Pattern/deviations: Step-through pattern;Decreased stride length Gait velocity: decr Gait velocity interpretation: Below normal speed for age/gender General Gait Details: Pt slightly unsteady without assistive device. Better stability when using walker. Pt with decr SpO2. Please see separate note for details.  Stairs            Wheelchair Mobility    Modified Rankin (Stroke Patients Only)       Balance Overall balance assessment: Needs assistance Sitting-balance support: No upper extremity supported Sitting balance-Leahy Scale: Normal     Standing balance support: No upper extremity supported Standing balance-Leahy Scale: Fair                               Pertinent Vitals/Pain Pain Assessment: No/denies pain    Home Living Family/patient expects to be  discharged to:: Private residence Living Arrangements: Parent;Other relatives Available Help at Discharge: Family;Available PRN/intermittently Type of Home: House Home Access: Level entry     Home Layout: One level Home Equipment: Cane - single point      Prior Function Level of Independence: Independent               Hand Dominance        Extremity/Trunk Assessment   Upper Extremity Assessment: Overall WFL for tasks assessed           Lower Extremity Assessment: Overall WFL for tasks assessed         Communication   Communication: No difficulties  Cognition Arousal/Alertness: Awake/alert Behavior During Therapy: WFL for tasks assessed/performed Overall Cognitive Status: Within Functional Limits for tasks assessed                      General Comments      Exercises        Assessment/Plan    PT Assessment Patient needs continued PT services  PT Diagnosis Difficulty walking   PT Problem List Decreased activity tolerance;Decreased balance;Decreased mobility  PT Treatment Interventions Gait training;Functional mobility training;Therapeutic activities;Therapeutic exercise;Balance training;Patient/family education;DME instruction   PT Goals (Current goals can be found in the Care Plan section) Acute Rehab PT Goals Patient Stated Goal: return home PT Goal Formulation: With patient Time For Goal Achievement: 07/30/15 Potential to Achieve Goals: Good    Frequency Min 2X/week   Barriers  to discharge        Co-evaluation               End of Session Equipment Utilized During Treatment: Oxygen Activity Tolerance: Other (comment) (low SpO2) Patient left: in bed;with call bell/phone within reach;with nursing/sitter in room Nurse Communication: Mobility status         Time: 6387-5643 PT Time Calculation (min) (ACUTE ONLY): 22 min   Charges:   PT Evaluation $Initial PT Evaluation Tier I: 1 Procedure     PT G Codes:         Ruxin Ransome 2015-07-24, 1:47 PM [facility] PT [phone]

## 2015-07-23 NOTE — Discharge Summary (Signed)
[facility] Discharge Summary  Patient name: [name] Medical record number: [id number] Date of birth: 1951-05-04 Age: 64 y.o. Gender: female Date of Admission: 07/19/2015  Date of Discharge: 07/24/2015 Admitting Physician: Zenia Resides, MD  Primary Care Provider: Ronnie Doss, DO Consultants: Pulmonology   Indication for Hospitalization: Acute on chronic respiratory failure   Discharge Diagnoses/Problem List:  Patient Active Problem List   Diagnosis Date Noted  . Diastolic congestive heart failure ([place])   . COPD (chronic obstructive pulmonary disease) ([place]) 07/19/2015  . Acute on chronic respiratory failure ([place]) 12/24/2013  . COPD exacerbation ([place]) 12/24/2013  . Hypertension 08/14/2009  . BREAST CANCER, HX OF 12/06/2008   Disposition: Home,   Discharge Condition: Stable on home oxygen, new oxygen requirement   Discharge Exam:  General: Patient lying in bed, A/O, NAD  Cardiovascular: RRR, no murmurs noted, intact distal pulses Respiratory: slight right side crackles, otherwise CTAB, normal WOB MSK: Trace pitting edema this AM   Brief Hospital Course:  Patient was admitted with an acute on chronic respiratory failure likely due a COPD exacerbation. Patient was previously admitted a year ago for CAP requiring intubation, following she was diagnosised with COPD, with PFTs. Patient in the past refused controller medications, and therefore was admitted on no controller meds. Patient was transferred from [facility], and on admission to our service was found to be somnolent. A stat ABG was obtained and patient was found to be pH 7.175, pCO2 95.6, pO2. Patient was started on BiPAP, and received 24 hours of BiPAP before transitions to nasal cannula. CXR was obtained showing no acute process. COPD exacerbation also treated with  solumedrol and Levaquin on admission before transitioning over to oral prednisone and doxycycline following patient  weaning from BiPAP. On admission, patient was also found to have slightly pitting edema to mid-calf, with BNP of 831.2. Patient received 2 doses of IV lasix, however patient respiratory status was most likely due to COPD exacerbation. Echo was found to be unchanged from previous Echo in 2015. On discharge, patient received home oxygen, as she was requiring 4 Liters with ambulation and 2 Liters at rest. Patient was continued on controller medications including Spiriva and Dulera as well.    Issues for Follow Up:  1. Patient was started on [place] for better control of COPD, continue to manage and refill medications as needed (currently sent home with samples)  2. Patient to have prolonged course of doxycycline until 11/14,  also requiring  A two week total prednisone taper until 11/18  3. Sent home with 4 liters of oxygen, readdress requirement  4. Started patient on Protonix, while on steroid taper 5. Pulmonology indicates patient needs and outpatient sleep study  6. Will follow up with pulmonology in two weeks, confirm with patient   Significant Procedures: None    Significant Labs and Imaging:   Recent Labs Lab 07/20/15 0223 07/22/15 0905 07/23/15 0657  WBC 6.5 7.5 7.4  HGB 9.7* 10.0* 10.4*  HCT 37.1 37.4 40.2  PLT 166 141* 143*    Recent Labs Lab 07/19/15 0545 07/19/15 0549 07/20/15 0223 07/22/15 0905 07/23/15 0657  NA 144 142 145 142 142  K 4.1 3.9 4.5 4.5 4.0  CL 103 98* 100* 98* 95*  CO2 34*  --  38* 35* 43*  GLUCOSE 116* 119* 112* 108* 100*  BUN 31* 30* 22* 19 13  CREATININE 0.67 0.80 0.50 0.54 0.54  CALCIUM 8.1*  --  8.2* 8.0* 7.9*  MG  --   --  2.2  --   --   PHOS  --   --  4.3  --   --      ECHO 42-39 grade 1 diastolic dysfunction, unchanged from previous ECHO   Results/Tests Pending at Time of Discharge: None  Discharge Medications:    Medication List    TAKE these medications        albuterol 108 (90 BASE) MCG/ACT inhaler  Commonly known  as:  PROVENTIL HFA;VENTOLIN HFA  Inhale 2 puffs into the lungs every 6 (six) hours as needed for wheezing or shortness of breath.      ASK your doctor about these medications        multivitamin with minerals Tabs tablet  Take 1 tablet by mouth daily.     naproxen sodium 220 MG tablet  Commonly known as:  ANAPROX  Take 1 tablet (220 mg total) by mouth 2 (two) times daily as needed.     tiotropium 18 MCG inhalation capsule  Commonly known as:  SPIRIVA HANDIHALER  Place 1 capsule (18 mcg total) into inhaler and inhale daily.        Discharge Instructions: Please refer to Patient Instructions section of EMR for full details.  Patient was counseled important signs and symptoms that should prompt return to medical care, changes in medications, dietary instructions, activity restrictions, and follow up appointments.   Follow-Up Appointments:     Follow-up Information    Follow up with Melina Schools, DO. Go on 07/28/2015.   Specialty:  Family Medicine   Why:  Hospital follow-up @ 10 AM    Contact information:   [address] [place] [address] [phone]       Follow up with [facility]. Call in 1 week.   Specialty:  Pulmonology   Why:  Will need a follow up in two weeks    Contact information:   [place], MD 07/23/2015, 9:49 AM PGY-1, [place]

## 2015-07-23 NOTE — Progress Notes (Addendum)
SATURATION QUALIFICATIONS: (This note is used to comply with regulatory documentation for home oxygen)  Patient Saturations on Room Air at Rest = 79%  Patient Saturations on Room Air while Ambulating = 67%  Patient Saturations on 4 Liters of oxygen while Ambulating = 84-85%  Patient Saturations on 6 Liters of oxygen while Ambulating = 86-89%  Patient Saturations on 4 Liters of oxygen at Rest = 88-90%   Please briefly explain why patient needs home oxygen: patient's oxygen saturations are low.   12:12 PM MD paged to update on results and aware. MD stated they will place orders for oxygen flow rate for patient at discharge.

## 2015-07-23 NOTE — Progress Notes (Signed)
SATURATION QUALIFICATIONS: (This note is used to comply with regulatory documentation for home oxygen)  Patient Saturations on Room Air at Rest = 79%  Patient Saturations on Room Air while Ambulating = N/A  Patient Saturations on 3 Liters of oxygen while Ambulating = 67%  Patient Saturations on 6 Liters of oxygen while Ambulating = 89%  Please briefly explain why patient needs home oxygen: Needs supplemental O2 at rest and with activity to maintain adequate oxygenation.  [employer] PT [phone]

## 2015-07-23 NOTE — Progress Notes (Signed)
Order from MD to perform a O2 walking study on patient for possible discharge later today.  Will perform O2 walking study.

## 2015-07-23 NOTE — Progress Notes (Addendum)
Paged MD intern about PT consult  Pt consult received

## 2015-07-23 NOTE — Discharge Instructions (Signed)
You were admitted for acute respiratory failure likely due to a COPD exacerbation (moderate/severe COPD). You will need to continue using oxygen on discharge from hospital. Can be reassessed at hospital follow up appointment. Please continue with 4 Liters of oxygen at home.  You will also need to start taking Spiriva and Dulera for home controller medications.You should take these daily.  You will need to take Doxycycline for 5 days until 11/14  and Prednisone 10 mg  (take 4 tablets for 3 days, then 3 tabs for 2 days, then 2 tabs for 2 days, and then 1 tab for 2 days)  Continue taking Protonix for the next 9 days as well.   Chronic Obstructive Pulmonary Disease Chronic obstructive pulmonary disease (COPD) is a common lung condition in which airflow from the lungs is limited. COPD is a general term that can be used to describe many different lung problems that limit airflow, including both chronic bronchitis and emphysema. If you have COPD, your lung function will probably never return to normal, but there are measures you can take to improve lung function and make yourself feel better. CAUSES   Smoking (common).  Exposure to secondhand smoke.  Genetic problems.  Chronic inflammatory lung diseases or recurrent infections. SYMPTOMS  Shortness of breath, especially with physical activity.  Deep, persistent (chronic) cough with a large amount of thick mucus.  Wheezing.  Rapid breaths (tachypnea).  Gray or bluish discoloration (cyanosis) of the skin, especially in your fingers, toes, or lips.  Fatigue.  Weight loss.  Frequent infections or episodes when breathing symptoms become much worse (exacerbations).  Chest tightness. DIAGNOSIS Your health care provider will take a medical history and perform a physical examination to diagnose COPD. Additional tests for COPD may include:  Lung (pulmonary) function tests.  Chest X-ray.  CT scan.  Blood tests. TREATMENT  Treatment for  COPD may include:  Inhaler and nebulizer medicines. These help manage the symptoms of COPD and make your breathing more comfortable.  Supplemental oxygen. Supplemental oxygen is only helpful if you have a low oxygen level in your blood.  Exercise and physical activity. These are beneficial for nearly all people with COPD.  Lung surgery or transplant.  Nutrition therapy to gain weight, if you are underweight.  Pulmonary rehabilitation. This may involve working with a team of health care providers and specialists, such as respiratory, occupational, and physical therapists. HOME CARE INSTRUCTIONS  Take all medicines (inhaled or pills) as directed by your health care provider.  Avoid over-the-counter medicines or cough syrups that dry up your airway (such as antihistamines) and slow down the elimination of secretions unless instructed otherwise by your health care provider.  If you are a smoker, the most important thing that you can do is stop smoking. Continuing to smoke will cause further lung damage and breathing trouble. Ask your health care provider for help with quitting smoking. He or she can direct you to community resources or hospitals that provide support.  Avoid exposure to irritants such as smoke, chemicals, and fumes that aggravate your breathing.  Use oxygen therapy and pulmonary rehabilitation if directed by your health care provider. If you require home oxygen therapy, ask your health care provider whether you should purchase a pulse oximeter to measure your oxygen level at home.  Avoid contact with individuals who have a contagious illness.  Avoid extreme temperature and humidity changes.  Eat healthy foods. Eating smaller, more frequent meals and resting before meals may help you maintain your strength.  Stay active, but balance activity with periods of rest. Exercise and physical activity will help you maintain your ability to do things you want to do.  Preventing  infection and hospitalization is very important when you have COPD. Make sure to receive all the vaccines your health care provider recommends, especially the pneumococcal and influenza vaccines. Ask your health care provider whether you need a pneumonia vaccine.  Learn and use relaxation techniques to manage stress.  Learn and use controlled breathing techniques as directed by your health care provider. Controlled breathing techniques include:  Pursed lip breathing. Start by breathing in (inhaling) through your nose for 1 second. Then, purse your lips as if you were going to whistle and breathe out (exhale) through the pursed lips for 2 seconds.  Diaphragmatic breathing. Start by putting one hand on your abdomen just above your waist. Inhale slowly through your nose. The hand on your abdomen should move out. Then purse your lips and exhale slowly. You should be able to feel the hand on your abdomen moving in as you exhale.  Learn and use controlled coughing to clear mucus from your lungs. Controlled coughing is a series of short, progressive coughs. The steps of controlled coughing are: 1. Lean your head slightly forward. 2. Breathe in deeply using diaphragmatic breathing. 3. Try to hold your breath for 3 seconds. 4. Keep your mouth slightly open while coughing twice. 5. Spit any mucus out into a tissue. 6. Rest and repeat the steps once or twice as needed. SEEK MEDICAL CARE IF:  You are coughing up more mucus than usual.  There is a change in the color or thickness of your mucus.  Your breathing is more labored than usual.  Your breathing is faster than usual. SEEK IMMEDIATE MEDICAL CARE IF:  You have shortness of breath while you are resting.  You have shortness of breath that prevents you from:  Being able to talk.  Performing your usual physical activities.  You have chest pain lasting longer than 5 minutes.  Your skin color is more cyanotic than usual.  You measure low  oxygen saturations for longer than 5 minutes with a pulse oximeter. MAKE SURE YOU:  Understand these instructions.  Will watch your condition.  Will get help right away if you are not doing well or get worse.   This information is not intended to replace advice given to you by your health care provider. Make sure you discuss any questions you have with your health care provider.   Document Released: 06/09/2005 Document Revised: 09/20/2014 Document Reviewed: 04/26/2013 Elsevier Interactive Patient Education [employer].

## 2015-07-23 NOTE — Care Management Note (Addendum)
Case Management Note  Patient Details  Name: [name] MRN: [id number] Date of Birth: [birth year]  Subjective/Objective:   Date: 07/23/15 Spoke with patient at the bedside. Introduced self as Tourist information centre manager and explained role in discharge planning and how to be reached. Verified patient lives in town,  with mom and brother. Expressed potential need for home oxygen per RN other DME, will get ambulatory sats. Verified patient anticipates to go home with family,  at time of discharge and will have full-time supervision by family at this time to best of their knowledge. Patient denied needing help with their medication. Patient drives  to MD appointments. Verified patient has PCP Dr. Andria Frames with family practice. Patient chose [facility] for home oxygen, referral made to [facility] with [place].  He will bring tank up to room.  NCM gave patient dulera trial coupon.    Plan: CM will continue to follow for discharge planning and [facility] resources.                  Action/Plan:   Expected Discharge Date:                  Expected Discharge Plan:  Home/Self Care  In-House Referral:     Discharge planning Services  CM Consult  Post Acute Care Choice:  Durable Medical Equipment Choice offered to:  Patient  DME Arranged:   oxygen DME Agency:  [place]:    [place] Agency:     Status of Service:  Completed, signed off  Medicare Important Message Given:  Yes-second notification given Date Medicare IM Given:    Medicare IM give by:    Date Additional Medicare IM Given:    Additional Medicare Important Message give by:     If discussed at [place] of Stay Meetings, dates discussed:    Additional Comments:  Zenon Mayo, RN 07/23/2015, 10:49 AM

## 2015-07-23 NOTE — Progress Notes (Signed)
Pharmaist called about patient home prescription paged resident for further instructions  [phone] Sarna [facility] [address]) prescription sent over for prednisone one for 50 mg and one for 10mg  can you clerify which order you want the patient to take at home

## 2015-07-23 NOTE — Progress Notes (Signed)
Family Medicine Teaching Service Daily Progress Note Intern Pager: [phone]  Patient name: [name] Medical record number: [id number] Date of birth: Dec 20, 1950 Age: 64 y.o. Gender: female  Primary Care Provider: Ronnie Doss, DO Consultants: CCM Code Status: Full  Pt Overview and Major Events to Date:  11/5: patient admitted for AoC hypercarbic resp failure.  Assessment and Plan: Roman Sandall is a 64 y.o. female presenting with SOB. PMH is significant for Breast Cancer hx, COPD, HTN, Psoriasis, Macular Degeneration, HFpEF, Gastric Bypass.   #Acute on Chronic Hypercapnic Respiratory Failure 2/2 COPD Exacerbation: Patient with COPD and 40-pack-year hx. Does not take controller medications and does not believe that she has COPD. Patient admitted for CAP requiring intubation in 12/2013. DC'd on home oxygen but did not use. ABG showed acute resp acidosis with PH 7.3, PCO2 70.6, HCO3 34. CTA with trace right-sided pleural effusion, and minimal interlobular septal thickening, no concern for PE. CXR 11/6 showing pulmonary congestion and decreased lung volumes. - On 3L with good saturations, walk test positive for continued home O2 requirement of 4 L - CCM  Indicates patient should have an outpatient sleep study  - Two-week taper of prednisone; recommend 10 days total abx for acute bronchitis; Recommend continuing to wean FiO2 for saturation 88-92%; Patient will need an outpatient follow-up with pulmonary within 2 weeks of discharge (signed off)  - ContinueDuonebs q6hr prn  - Continue Dulera and Spiriva for control  - Albuterol PRN  - Continuous pulse oximetry - Contact care management about home O2   #HFpEF: Not currently fluid overloaded. Last Echo 12/2013 with EF 60-65% and Grade 1 Diastolic Dysfunction. 1+ Pitting edema to mid-calf. BNP 407.4 (11/7). Weight: 230>311> 256lboday. Urine output 600cc, with net +_270 since admission. EKG low voltage NSR, no evidence of ischemia.  Troponins: neg x3. BNP: 831.2>407.4  Respiratory status most likely 2/2 COPD exacerbation rather than volume overload.  - ECHO 03-00 grade 1 diastolic dysfunction, unchanged from previous ECHO  - Will monitor closely  #HTN BP stable. No home med  - Continue to monitor   #Hx Breast Cancer (1997). Patient had a DVT during 1997 admission for Breast CA   FEN/GI: SLIV; NPO until improvement in respiratory status Prophylaxis: Lovenox (patient and family refused); full diet  Disposition: pending medical stability  Subjective:  Patient denies any SOB, she states she feels back to her regular self. She would like to go home today. Denies fever, chills, n/v or palpations    Objective: Temp:  [97.7 F (36.5 C)-98.6 F (37 C)] 98.2 F (36.8 C) (11/09 0511) Pulse Rate:  [67-87] 70 (11/09 0511) Resp:  [16-28] 18 (11/09 0511) BP: (121-139)/(56-103) 127/67 mmHg (11/09 0511) SpO2:  [88 %-97 %] 94 % (11/09 0511) Weight:  [245 lb 13 oz (111.5 kg)-246 lb 14.6 oz (112 kg)] 246 lb 14.6 oz (112 kg) (11/09 0511) Physical Exam: General: Patient lying in bed, A/O, NAD  Cardiovascular: RRR, no murmurs noted, intact distal pulses Respiratory: CTAB, normal WOB MSK: Trace pitting edema this AM   Laboratory:  Recent Labs Lab 07/19/15 0648 07/20/15 0223 07/22/15 0905  WBC 12.0* 6.5 7.5  HGB 9.9* 9.7* 10.0*  HCT 36.4 37.1 37.4  PLT 195 166 141*    Recent Labs Lab 07/19/15 0545 07/19/15 0549 07/20/15 0223 07/22/15 0905  NA 144 142 145 142  K 4.1 3.9 4.5 4.5  CL 103 98* 100* 98*  CO2 34*  --  38* 35*  BUN 31* 30* 22* 19  CREATININE 0.67  0.80 0.50 0.54  CALCIUM 8.1*  --  8.2* 8.0*  GLUCOSE 116* 119* 112* 108*   ABG    Component Value Date/Time   PHART 7.309* 07/20/2015 0120   PCO2ART 74.3* 07/20/2015 0120   PO2ART 74.9* 07/20/2015 0120   HCO3 36.2* 07/20/2015 0120   TCO2 38.5 07/20/2015 0120   O2SAT 93.8 07/20/2015 0120   A1c: pending BNP: 831.2>407.4  Imaging/Diagnostic  Tests:  Ct Angio Chest Pe W/cm &/or Wo Cm  07/19/2015  CLINICAL DATA:  64 year old female with a history of short of breath EXAM: CT ANGIOGRAPHY CHEST WITH CONTRAST TECHNIQUE: Multidetector CT imaging of the chest was performed using the standard protocol during bolus administration of intravenous contrast. Multiplanar CT image reconstructions and MIPs were obtained to evaluate the vascular anatomy. CONTRAST:  157mL OMNIPAQUE IOHEXOL 350 MG/ML SOLN COMPARISON:  None. FINDINGS: Chest: Unremarkable appearance of the chest superficial soft tissues. No axillary or supraclavicular adenopathy. Surgical clips within the left axilla. Unremarkable appearance of the thoracic inlet, including the visualized thyroid. Several mediastinal lymph nodes, none of which are enlarged by CT size criteria or have suspicious features. Unremarkable appearance of the esophagus. Unremarkable course caliber and contour of the thoracic aorta without dissection flap, aneurysm, periaortic fluid. No central, lobar, segmental, or proximal subsegmental filling defects to indicate pulmonary emboli. Transverse diameter of the main pulmonary artery measures 4.2 cm. There is prominence of the pulmonary arteries as they extend to the peripheral 1/3 of the lungs, and they are greater in diameter than the accompanying bronchi at the periphery. Heart size borderline enlarged. Trace pericardial fluid/ thickening, particularly anteriorly. Calcifications of the left main and circumflex coronary arteries. Diffuse centrilobular and paraseptal emphysema. No confluent airspace disease. Small right pleural effusion. Mixed ground-glass and nodular opacity in the dependent lung bases, more pronounced on the left. Trace interlobular septal thickening, more pronounced at the superior aspects of the lungs. Upper abdomen: Surgical changes of gastric bypass.  Small hiatal hernia. Review of the MIP images confirms the above findings. IMPRESSION: Trace right-sided  pleural effusion, and minimal interlobular septal thickening. This may reflect mild edema, and correlation with a history of CHF and current lab values may be useful. Diffuse emphysema Evidence of developing pulmonary hypertension, with enlarged main pulmonary artery and prominence of the distal pulmonary arteries of the bilateral lungs. If not already performed, referral for pulmonary evaluation may be useful. Signed, Dulcy Fanny. Earleen Newport, DO [facility] Radiology Electronically Signed   By: Corrie Mckusick D.O.   On: 07/19/2015 08:08   Dg Chest Portable 1 View  07/20/2015  CLINICAL DATA:  64 year old female with acute respiratory failure. EXAM: PORTABLE CHEST 1 VIEW COMPARISON:  Chest x-ray 07/19/2015. FINDINGS: Lung volumes are slightly low. No consolidative airspace disease. No pleural effusions. Mild crowding of the pulmonary vasculature, without frank pulmonary edema. Mild cardiomegaly. Upper mediastinal contours are within normal limits. Atherosclerosis in the thoracic aorta. Surgical clips project over the left axilla, related to prior left axillary lymph node dissection. IMPRESSION: 1. Cardiomegaly with pulmonary venous congestion. 2. Low lung volumes. 3. Atherosclerosis. Electronically Signed   By: Vinnie Langton M.D.   On: 07/20/2015 08:29   Dg Abd Acute W/chest  07/19/2015  CLINICAL DATA:  Dyspnea. Hypoxia. Central chest and abdominal pain for 2 weeks. EXAM: DG ABDOMEN ACUTE W/ 1V CHEST COMPARISON:  02/05/2014 FINDINGS: There is no evidence of dilated bowel loops or free intraperitoneal air. No radiopaque calculi or other significant radiographic abnormality is seen. Heart  size and mediastinal contours are within normal limits. Both lungs are clear. IMPRESSION: Negative abdominal radiographs.  No acute cardiopulmonary disease. Electronically Signed   By: Andreas Newport M.D.   On: 07/19/2015 06:11    Sharlene Mccluskey Cletis Media, MD 07/23/2015, 7:27  AM PGY-1, [place] Intern pager: [phone], text pages welcome

## 2015-07-24 MED ORDER — DOXYCYCLINE HYCLATE 100 MG PO TABS: 100.0000 mg | ORAL_TABLET | Freq: Two times a day (BID) | ORAL | Status: AC

## 2015-07-24 NOTE — Progress Notes (Signed)
Medication Sample Provided  Pt provided with Dulera 200 mcg/5 mcg Inhaler from [facility] for use after discharge. Sig: Inhale 2 puffs by mouth twice daily  Exp 06/19/16 LOT: [id number] Ordering MD: Kerrin Mo  Medication counseling was provided on inhaler technique, SE.  Pt demonstrated understanding.   Bennye Alm, PharmD Pharmacy Resident [phone]

## 2015-07-24 NOTE — Progress Notes (Signed)
Family Medicine Teaching Service Daily Progress Note Intern Pager: [phone]  Patient name: [name] Medical record number: [id number] Date of birth: [birth year] Age: 64 y.o. Gender: female  Primary Care Provider: Ronnie Doss, DO Consultants: CCM Code Status: Full  Pt Overview and Major Events to Date:  11/5: patient admitted for AoC hypercarbic resp failure.  Assessment and Plan: [name] is a 64 y.o. female presenting with SOB. PMH is significant for Breast Cancer hx, COPD, HTN, Psoriasis, Macular Degeneration, HFpEF, Gastric Bypass.   #Acute on Chronic Hypercapnic Respiratory Failure 2/2 COPD Exacerbation: Patient with COPD and 40-pack-year hx. Does not take controller medications and does not believe that she has COPD. Patient admitted for CAP requiring intubation in 12/2013. DC'd on home oxygen but did not use. ABG showed acute resp acidosis with PH 7.3, PCO2 70.6, HCO3 34. CTA with trace right-sided pleural effusion, and minimal interlobular septal thickening, no concern for PE. CXR 11/6 showing pulmonary congestion and decreased lung volumes. - CCM  Indicates patient should have an outpatient sleep study  - Two-week taper of prednisone; recommend 10 days total abx for acute bronchitis; Recommend continuing to wean FiO2 for saturation 88-92%; Patient will need an outpatient follow-up with pulmonary within 2 weeks of discharge (signed off)  - ContinueDuonebs q6hr prn  - Continue Dulera and Spiriva for control  - Albuterol PRN  - Continuous pulse oximetry - Home O2 in place, should go home on 4 Liters (satuartions of 92% while ambulating on 4 L) Discharge today   #HFpEF: Not currently fluid overloaded. Last Echo 12/2013 with EF 60-65% and Grade 1 Diastolic Dysfunction. 1+ Pitting edema to mid-calf. BNP 407.4 (11/7). Weight: 230>311> 256lboday. Urine output 600cc, with net +_270 since admission. EKG low voltage NSR, no evidence of ischemia. Troponins: neg x3. BNP:  831.2>407.4  Respiratory status most likely 2/2 COPD exacerbation rather than volume overload.  - ECHO 123456 grade 1 diastolic dysfunction, unchanged from previous ECHO  - Will monitor closely  #HTN BP stable. No home med  - Continue to monitor   #Hx Breast Cancer (1997). Patient had a DVT during 1997 admission for Breast CA   FEN/GI: [facility].  Prophylaxis: Lovenox (patient and family refused)  Disposition: pending medical stability  Subjective:  Patient denies any SOB, she feels good this morning. Sats of 91% on 4 Liters while ambulating.   Objective: Temp:  [98.2 F (36.8 C)-98.4 F (36.9 C)] 98.4 F (36.9 C) (11/10 0520) Pulse Rate:  [72-80] 80 (11/10 0942) Resp:  [16-18] 18 (11/10 0942) BP: (116-138)/(68) 116/68 mmHg (11/10 0520) SpO2:  [67 %-97 %] 90 % (11/10 0942) Weight:  [247 lb 9.2 oz (112.3 kg)] 247 lb 9.2 oz (112.3 kg) (11/10 0520) Physical Exam: General: Patient lying in bed, A/O, NAD  Cardiovascular: RRR, no murmurs noted, intact distal pulses Respiratory: slight right side crackles, otherwise CTAB, normal WOB MSK: Trace pitting edema this AM   Laboratory:  Recent Labs Lab 07/20/15 0223 07/22/15 0905 07/23/15 0657  WBC 6.5 7.5 7.4  HGB 9.7* 10.0* 10.4*  HCT 37.1 37.4 40.2  PLT 166 141* 143*    Recent Labs Lab 07/20/15 0223 07/22/15 0905 07/23/15 0657  NA 145 142 142  K 4.5 4.5 4.0  CL 100* 98* 95*  CO2 38* 35* 43*  BUN 22* 19 13  CREATININE 0.50 0.54 0.54  CALCIUM 8.2* 8.0* 7.9*  GLUCOSE 112* 108* 100*   ABG    Component Value Date/Time   PHART 7.309* 07/20/2015 0120  PCO2ART 74.3* 07/20/2015 0120   PO2ART 74.9* 07/20/2015 0120   HCO3 36.2* 07/20/2015 0120   TCO2 38.5 07/20/2015 0120   O2SAT 93.8 07/20/2015 0120   A1c: pending BNP: 831.2>407.4  Imaging/Diagnostic Tests:  Ct Angio Chest Pe W/cm &/or Wo Cm  07/19/2015  CLINICAL DATA:  64 year old female with a history of short of breath EXAM: CT ANGIOGRAPHY CHEST WITH  CONTRAST TECHNIQUE: Multidetector CT imaging of the chest was performed using the standard protocol during bolus administration of intravenous contrast. Multiplanar CT image reconstructions and MIPs were obtained to evaluate the vascular anatomy. CONTRAST:  153mL OMNIPAQUE IOHEXOL 350 MG/ML SOLN COMPARISON:  None. FINDINGS: Chest: Unremarkable appearance of the chest superficial soft tissues. No axillary or supraclavicular adenopathy. Surgical clips within the left axilla. Unremarkable appearance of the thoracic inlet, including the visualized thyroid. Several mediastinal lymph nodes, none of which are enlarged by CT size criteria or have suspicious features. Unremarkable appearance of the esophagus. Unremarkable course caliber and contour of the thoracic aorta without dissection flap, aneurysm, periaortic fluid. No central, lobar, segmental, or proximal subsegmental filling defects to indicate pulmonary emboli. Transverse diameter of the main pulmonary artery measures 4.2 cm. There is prominence of the pulmonary arteries as they extend to the peripheral 1/3 of the lungs, and they are greater in diameter than the accompanying bronchi at the periphery. Heart size borderline enlarged. Trace pericardial fluid/ thickening, particularly anteriorly. Calcifications of the left main and circumflex coronary arteries. Diffuse centrilobular and paraseptal emphysema. No confluent airspace disease. Small right pleural effusion. Mixed ground-glass and nodular opacity in the dependent lung bases, more pronounced on the left. Trace interlobular septal thickening, more pronounced at the superior aspects of the lungs. Upper abdomen: Surgical changes of gastric bypass.  Small hiatal hernia. Review of the MIP images confirms the above findings. IMPRESSION: Trace right-sided pleural effusion, and minimal interlobular septal thickening. This may reflect mild edema, and correlation with a history of CHF and current lab values may be  useful. Diffuse emphysema Evidence of developing pulmonary hypertension, with enlarged main pulmonary artery and prominence of the distal pulmonary arteries of the bilateral lungs. If not already performed, referral for pulmonary evaluation may be useful. Signed, Dulcy Fanny. Earleen Newport, DO [facility] Electronically Signed   By: Corrie Mckusick D.O.   On: 07/19/2015 08:08   Dg Chest Portable 1 View  07/20/2015  CLINICAL DATA:  64 year old female with acute respiratory failure. EXAM: PORTABLE CHEST 1 VIEW COMPARISON:  Chest x-ray 07/19/2015. FINDINGS: Lung volumes are slightly low. No consolidative airspace disease. No pleural effusions. Mild crowding of the pulmonary vasculature, without frank pulmonary edema. Mild cardiomegaly. Upper mediastinal contours are within normal limits. Atherosclerosis in the thoracic aorta. Surgical clips project over the left axilla, related to prior left axillary lymph node dissection. IMPRESSION: 1. Cardiomegaly with pulmonary venous congestion. 2. Low lung volumes. 3. Atherosclerosis. Electronically Signed   By: Vinnie Langton M.D.   On: 07/20/2015 08:29   Dg Abd Acute W/chest  07/19/2015  CLINICAL DATA:  Dyspnea. Hypoxia. Central chest and abdominal pain for 2 weeks. EXAM: DG ABDOMEN ACUTE W/ 1V CHEST COMPARISON:  02/05/2014 FINDINGS: There is no evidence of dilated bowel loops or free intraperitoneal air. No radiopaque calculi or other significant radiographic abnormality is seen. Heart size and mediastinal contours are within normal limits. Both lungs are clear. IMPRESSION: Negative abdominal radiographs.  No acute cardiopulmonary disease. Electronically Signed   By: Andreas Newport M.D.   On: 07/19/2015  06:11    Ugonna Keirsey Cletis Media, MD 07/24/2015, 9:47 AM PGY-1, [place] Intern pager: [phone], text pages welcome

## 2015-07-24 NOTE — Progress Notes (Signed)
SATURATION QUALIFICATIONS: (This note is used to comply with regulatory documentation for home oxygen)  Patient Saturations on Room Air at Rest = 92%  Patient Saturations on Room Air while Ambulating = 88%  Patient Saturations on 6 Liters of oxygen while Ambulating = 93%  Please briefly explain why patient needs home oxygen: patient's oxygen saturations are low.

## 2015-07-24 NOTE — Progress Notes (Signed)
[name] to be D/C'd Home per MD order.  Discussed with the patient and all questions fully answered.  VSS, Skin clean, dry and intact without evidence of skin break down, no evidence of skin tears noted. IV catheter discontinued intact. Site without signs and symptoms of complications. Dressing and pressure applied.  An After Visit Summary was printed and given to the patient. Patient received prescription.  D/c education completed with patient/family including follow up instructions, medication list, d/c activities limitations if indicated, with other d/c instructions as indicated by MD - patient able to verbalize understanding, all questions fully answered.   Patient instructed to return to ED, call 911, or call MD for any changes in condition.   Patient escorted via [place], and D/C home via private auto.  Malcolm Metro 07/24/2015 1:31 PM

## 2015-07-24 NOTE — Progress Notes (Signed)
SATURATION QUALIFICATIONS: (This note is used to comply with regulatory documentation for home oxygen)  Patient Saturations on 2.5 Liters of oxygen while at rest= 90%  Patient Saturations on 4 Liters of oxygen while at rest= 97%  Patient Saturations on Room Air at Rest = 81%  Patient Saturations on Room Air while Ambulating = 77%  Patient Saturations on 4 Liters of oxygen while Ambulating = 91%  Please briefly explain why patient needs home oxygen: patient's oxygen levels drop on room air. MD paged

## 2015-07-28 ENCOUNTER — Ambulatory Visit (INDEPENDENT_AMBULATORY_CARE_PROVIDER_SITE_OTHER): Payer: Medicare Other | Admitting: Internal Medicine

## 2015-07-28 ENCOUNTER — Encounter: Payer: Self-pay | Admitting: Internal Medicine

## 2015-07-28 VITALS — BP 102/61 | HR 81 | Temp 98.6°F | Wt 250.0 lb

## 2015-07-28 DIAGNOSIS — B373 Candidiasis of vulva and vagina: Secondary | ICD-10-CM | POA: Diagnosis not present

## 2015-07-28 DIAGNOSIS — B3731 Acute candidiasis of vulva and vagina: Secondary | ICD-10-CM

## 2015-07-28 DIAGNOSIS — J441 Chronic obstructive pulmonary disease with (acute) exacerbation: Secondary | ICD-10-CM | POA: Diagnosis present

## 2015-07-28 MED ORDER — FLUCONAZOLE 150 MG PO TABS: 150.0000 mg | ORAL_TABLET | Freq: Once | ORAL | Status: DC

## 2015-07-28 NOTE — Assessment & Plan Note (Addendum)
Seen today for hospital follow up. Improved and compliant with medications. O2 saturation stable with 2L O2 via [place]. Discussed that Spiriva and Dulera work in different ways and that it is recommended that she continue to use both inhalers. She will finish Doxycycline today and Prednisone taper on 11/18. Patient to follow up with pulmonology on 11/25 and they will order sleep study if needed. PNA vaccine is not indicated as patient received Pneumococcal 23 vaccine in 2015 at the age of 63. Return precautions given. Recommended continued use of home O2 as needed with monitoring of pulse ox.

## 2015-07-28 NOTE — Progress Notes (Signed)
Subjective:    [name] - 64 y.o. female MRN [id number]  Date of birth: 03/17/1951  HPI  [name] is here for hospital follow up of COPD exacerbation with new O2 requirement. Was prescribed Dulera, Spiriva, Doxycycline and Prednisone taper at discharge from hospital. Discharged on 2L of O2 at rest and 4L O2 with activity.   Today she reports that her breathing is much improved. She does not use O2 consistently throughout the day. Uses 2L during day with activity and 3L at night.   Has been taking Doxycycline and Prednisone appropriately. Complains of yeast infection that started a few days ago.  Reports that she is using the Spiriva and Dulera but has confusion over why she is taking duplicate medications. Inquired about PNA vaccine.     Health Maintenance Due  Topic Date Due  . Hepatitis C Screening  September 29, 1950  . TETANUS/TDAP  09/23/1967  . COLONOSCOPY  09/22/1998  . ZOSTAVAX  09/22/2008  . DEXA SCAN  09/22/2013  . PNA vac Low Risk Adult (2 of 2 - PCV13) 12/26/2014  . MAMMOGRAM  03/30/2015    -  reports that she quit smoking about 19 months ago. Her smoking use included Cigarettes. She has a 20 pack-year smoking history. She has never used smokeless tobacco. - Review of Systems: Per HPI. - Past Medical History: Patient Active Problem List   Diagnosis Date Noted  . Candidal vulvovaginitis 07/28/2015  . Diastolic congestive heart failure ([place])   . Acute bronchitis   . COPD (chronic obstructive pulmonary disease) ([place]) 07/19/2015  . Noncompliance with medications   . Eye swelling 05/01/2014  . CAP (community acquired pneumonia) 12/24/2013  . Acute on chronic respiratory failure ([place]) 12/24/2013  . COPD exacerbation ([place]) 12/24/2013  . Pneumonia 12/23/2013  . Shoulder and upper arm, abrasion or friction burn, without mention of infection 05/03/2012  . Fall down stairs 12/31/2011  . Hypoalbuminemia 11/23/2011  . Anemia, iron deficiency 10/26/2011  . Hypocalcemia  10/26/2011  . Impaired fasting glucose 10/26/2011  . Fatigue 10/26/2011  . Lower extremity edema 10/20/2011  . Dyspnea 01/12/2011  . Bibasilar crackles 01/12/2011  . Osteoarthritis 01/04/2011  . TOBACCO ABUSE 08/14/2009  . ALLERGIC RHINITIS CAUSE UNSPECIFIED 08/14/2009  . ABNORMAL WEIGHT GAIN 08/14/2009  . Hypertension 08/14/2009  . PLANTAR WART, RIGHT 12/06/2008  . PSORIASIS 12/06/2008  . BREAST CANCER, HX OF 12/06/2008   - Medications: reviewed and updated Current Outpatient Prescriptions  Medication Sig Dispense Refill  . albuterol (PROVENTIL HFA;VENTOLIN HFA) 108 (90 BASE) MCG/ACT inhaler Inhale 2 puffs into the lungs every 6 (six) hours as needed for wheezing or shortness of breath. (Patient not taking: Reported on 07/19/2015) 1 Inhaler 2  . doxycycline (VIBRA-TABS) 100 MG tablet Take 1 tablet (100 mg total) by mouth every 12 (twelve) hours. 9 tablet 0  . fluconazole (DIFLUCAN) 150 MG tablet Take 1 tablet (150 mg total) by mouth once. 2 tablet 0  . mometasone-formoterol (DULERA) 100-5 MCG/ACT AERO Inhale 2 puffs into the lungs 2 (two) times daily. 1 Inhaler 0  . Multiple Vitamin (MULTIVITAMIN WITH MINERALS) TABS tablet Take 1 tablet by mouth daily.    . pantoprazole (PROTONIX) 40 MG tablet Take 1 tablet (40 mg total) by mouth daily. 9 tablet 0  . predniSONE (DELTASONE) 10 MG tablet Take 4 tabs for 3 days, then 3 tabs for 2 days, then 2 tabs for 2 days, then 1 tab for 2 days 24 tablet 0  . tiotropium (SPIRIVA HANDIHALER) 18 MCG  inhalation capsule Place 1 capsule (18 mcg total) into inhaler and inhale daily. 30 capsule 0   No current facility-administered medications for this visit.       Objective:   Physical Exam BP 102/61 mmHg  Pulse 81  Temp(Src) 98.6 F (37 C) (Oral)  Wt 250 lb (113.399 kg)  SpO2 88% without O2 SpO2 97% with O2 2L [place]  Gen: NAD, alert, cooperative with exam, well-appearing HEENT: NCAT, PERRL, clear conjunctiva, oropharynx clear, supple neck CV: RRR,  good S1/S2, no murmur, capillary refill brisk  Resp: CTABL, no wheezes, non-labored, good air movement  Abd: SNTND, BS present, no guarding or organomegaly Skin: no rashes, normal turgor Ext: 1+ pitting edema to LE bilaterally  Neuro: no gross deficits.  Psych: good insight, alert and oriented        Assessment & Plan:   COPD (chronic obstructive pulmonary disease) ([place]) Seen today for hospital follow up. Improved and compliant with medications. O2 saturation stable with 2L O2 via [place]. Discussed that Spiriva and Dulera work in different ways and that it is recommended that she continue to use both inhalers. She will finish Doxycycline today and Prednisone taper on 11/18. Patient to follow up with pulmonology on 11/25 and they will order sleep study if needed. PNA vaccine is not indicated as patient received Pneumococcal 23 vaccine in 2015 at the age of 52. Return precautions given. Recommended continued use of home O2 as needed with monitoring of pulse ox.   Candidal vulvovaginitis Secondary to doxycycline. Given Diflucan Rx. Instructed to take one pill and if not improved in 5-7 days to take second pill.   Follow up with PCP in 1-2 months for preventative health care visit.    Phill Myron, D.O. 07/28/2015, 11:25 AM PGY-1, [place]

## 2015-07-28 NOTE — Assessment & Plan Note (Signed)
Secondary to doxycycline. Given Diflucan Rx. Instructed to take one pill and if not improved in 5-7 days to take second pill.

## 2015-07-28 NOTE — Patient Instructions (Signed)
It was great seeing you today!   1. Continue to use your O2 as needed at home and check your O2 saturation periodically when off of O2.  2. Follow up with pulmonology at your visit on 11/25.  3. Continue using the Spiriva and Dulera inhalers. They work in different ways to help with your breathing.  4. Finish the prednisone and doxycyline.  5. I have given you a Rx for Diflucan to treat your yeast infection. Take one pill today. If you are still having symptoms in one week, take the second pill.  6. If you have severe increased work of breathing or difficulty maintaining your oxygen please come into the clinic or go to the ER.    Please bring all your medications to every doctors visit  Sign up for My Chart to have easy access to your labs results, and communication with your Primary care physician.  Next Appointment  Please call to make an appointment with Dr. Lajuana Ripple in 1-2 months.   I look forward to talking with you again at our next visit. If you have any questions or concerns before then, please call the clinic at [phone].  Take Care,   Dr. Phill Myron

## 2015-08-08 ENCOUNTER — Encounter: Payer: Self-pay | Admitting: Internal Medicine

## 2015-08-08 ENCOUNTER — Ambulatory Visit (INDEPENDENT_AMBULATORY_CARE_PROVIDER_SITE_OTHER): Payer: Medicare Other | Admitting: Internal Medicine

## 2015-08-08 VITALS — BP 142/80 | HR 84 | Ht 65.0 in | Wt 236.6 lb

## 2015-08-08 DIAGNOSIS — J449 Chronic obstructive pulmonary disease, unspecified: Secondary | ICD-10-CM

## 2015-08-08 DIAGNOSIS — J441 Chronic obstructive pulmonary disease with (acute) exacerbation: Secondary | ICD-10-CM | POA: Diagnosis not present

## 2015-08-08 DIAGNOSIS — J9611 Chronic respiratory failure with hypoxia: Secondary | ICD-10-CM | POA: Diagnosis not present

## 2015-08-08 DIAGNOSIS — I2781 Cor pulmonale (chronic): Secondary | ICD-10-CM

## 2015-08-08 DIAGNOSIS — J9612 Chronic respiratory failure with hypercapnia: Secondary | ICD-10-CM

## 2015-08-08 NOTE — Patient Instructions (Addendum)
Ok to try off spiriva   Please see patient coordinator before you leave today  to schedule ono on 2lpm   Work on inhaler technique:  relax and gently blow all the way out then take a nice smooth deep breath back in, triggering the inhaler at same time you start breathing in.  Hold for up to 5 seconds if you can. Blow out thru nose. Rinse and gargle with water when done    Please schedule a follow up office visit in 4 weeks, sooner if needed with [facility]

## 2015-08-08 NOTE — Progress Notes (Signed)
Subjective:    Patient ID: [name], female    DOB: 03/01/1951,    MRN: [id number]  HPI Dr Lake Bells  02/05/14  64 year old female who previously smoked 40-pack-years who comes in evaluation of COPD. She was recently hospitalized at [facility] in April 2015 for severe community acquired pneumonia and a severe exacerbation of COPD. She required mechanical ventilation for several days. She is extubated and discharged home on oxygen.  Since going home she says she does not use the oxygen at all. She's not taking any of the medications we have prescribed because she said the cost much. Specifically, she says that she's not taken Spriva. She said she took the samples of Spiriva but didn't make a difference.  She has not smoked cigarettes since leaving home.  She says that she's not able to exercise consistently because of chronic knee pain. However, with the exercise that she does perform she does not have shortness of breath. She does not have a cough. She feels "perfectly healthy".   12/2013 CXR > RML pneumonia 12/2013 PFT> Ratio 46%, FEV1  1.24L (54% pred, 44% change), TLC 6.17L (127% pred), DLCO 10.82 (48% pred),      Date of Admission: 11/5/2016Date of Discharge: 07/24/2015 Admitting Physician: Zenia Resides, MD  Primary Care Provider: Ronnie Doss, DO Consultants: Pulmonology   Indication for Hospitalization: Acute on chronic respiratory failure   Discharge Diagnoses/Problem List:  Patient Active Problem List   Diagnosis Date Noted  . Diastolic congestive heart failure ([place])   . COPD (chronic obstructive pulmonary disease) ([place]) 07/19/2015  . Acute on chronic respiratory failure ([place]) 12/24/2013  . COPD exacerbation ([place]) 12/24/2013  . Hypertension 08/14/2009  . BREAST CANCER, HX OF 12/06/2008   Disposition: Home,   Discharge Condition: Stable on home oxygen, new oxygen requirement   Discharge Exam:  General:  Patient lying in bed, A/O, NAD  Cardiovascular: RRR, no murmurs noted, intact distal pulses Respiratory: slight right side crackles, otherwise CTAB, normal WOB MSK: Trace pitting edema this AM   Brief Hospital Course:  Patient was admitted with an acute on chronic respiratory failure likely due a COPD exacerbation. Patient was previously admitted a year ago for CAP requiring intubation, following she was diagnosised with COPD, with PFTs. Patient in the past refused controller medications, and therefore was admitted on no controller meds. Patient was transferred from [facility], and on admission to our service was found to be somnolent. A stat ABG was obtained and patient was found to be pH 7.175, pCO2 95.6, pO2. Patient was started on BiPAP, and received 24 hours of BiPAP before transitions to nasal cannula. CXR was obtained showing no acute process. COPD exacerbation also treated with solumedrol and Levaquin on admission before transitioning over to oral prednisone and doxycycline following patient weaning from BiPAP. On admission, patient was also found to have slightly pitting edema to mid-calf, with BNP of 831.2. Patient received 2 doses of IV lasix, however patient respiratory status was most likely due to COPD exacerbation. Echo was found to be unchanged from previous Echo in 2015. On discharge, patient received home oxygen, as she was requiring 4 Liters with ambulation and 2 Liters at rest. Patient was continued on controller medications including Spiriva and Dulera as well.   Issues for Follow Up:  1. Patient was started on [place] for better control of COPD, continue to manage and refill medications as needed (currently sent home with samples)  2. Patient to have prolonged course  of doxycycline until 11/14, also requiring A two week total prednisone taper until 11/18  3. Sent home with 4 liters of oxygen, readdress requirement  4. Started patient on Protonix, while on  steroid taper 5. Pulmonology indicates patient needs and outpatient sleep study  6. Will follow up with pulmonology in two weeks, confirm with patient      08/08/2015  f/u ov/[name] re: GOLD II copd/ morbid obesity / 02 2lpm hs only  Chief Complaint  Patient presents with  . Hospitalization Follow-up    pt follows for hospital follow up. pt states she was having trouble breathing and was dx with acute bronchitis. pt sttaes she is doing much better. no c/o SOB, cough, wheezing or chest tightness. pt using O2 qhs 2LPM.  no concerns at this time.       Not limited by breathing from desired activities  But by knees at baseline  On 2lpm at hs since d/c  Newly on dulera and spiriva but not using prior to admit   No obvious day to day or daytime variability or assoc chronic cough or cp or chest tightness, subjective wheeze or overt sinus or hb symptoms. No unusual exp hx or h/o childhood pna/ asthma or knowledge of premature birth.  Sleeping ok without nocturnal  or early am exacerbation  of respiratory  c/o's or need for noct saba. Also denies any obvious fluctuation of symptoms with weather or environmental changes or other aggravating or alleviating factors except as outlined above   Current Medications, Allergies, Complete Past Medical History, Past Surgical History, Family History, and Social History were reviewed in [employer] record.  ROS  The following are not active complaints unless bolded sore throat, dysphagia, dental problems, itching, sneezing,  nasal congestion or excess/ purulent secretions, ear ache,   fever, chills, sweats, unintended wt loss, classically pleuritic or exertional cp, hemoptysis,  orthopnea pnd or leg swelling, presyncope, palpitations, abdominal pain, anorexia, nausea, vomiting, diarrhea  or change in bowel or bladder habits, change in stools or urine, dysuria,hematuria,  rash, arthralgias, visual complaints, headache, numbness, weakness or  ataxia or problems with walking or coordination,  change in mood/affect or memory.         amb obese wf nad  Wt Readings from Last 3 Encounters:  08/08/15 236 lb 9.6 oz (107.321 kg)  07/28/15 250 lb (113.399 kg)  07/24/15 247 lb 9.2 oz (112.3 kg)    Vital signs reviewed  HEENT: nl dentition, turbinates, and oropharynx. Nl external ear canals without cough reflex   NECK :  without JVD/Nodes/TM/ nl carotid upstrokes bilaterally   LUNGS: no acc muscle use, distant bs  bilaterally without cough on insp or exp maneuvers   CV:  RRR  no s3 or murmur or increase in P2,  1+ pitting  edema   ABD:  massively obese soft and nontender with  Very poor insp excursion in the supine position. No bruits or organomegaly, bowel sounds nl  MS:  warm without deformities, calf tenderness, cyanosis or clubbing  SKIN: warm and dry without lesions    NEURO:  alert, approp, no deficits      I personally reviewed images and agree with radiology impression as follows:  CTa Chest   07/19/15 Trace right-sided pleural effusion, and minimal interlobular septal thickening. This may reflect mild edema, and correlation with a history of CHF and current lab values may be useful.  Diffuse emphysema  Evidence of developing pulmonary hypertension, with enlarged main pulmonary  artery and prominence of the distal pulmonary arteries of the bilateral lungs      Assessment & Plan:

## 2015-08-09 ENCOUNTER — Encounter: Payer: Self-pay | Admitting: Internal Medicine

## 2015-08-09 DIAGNOSIS — J449 Chronic obstructive pulmonary disease, unspecified: Secondary | ICD-10-CM | POA: Insufficient documentation

## 2015-08-09 DIAGNOSIS — I2609 Other pulmonary embolism with acute cor pulmonale: Secondary | ICD-10-CM | POA: Insufficient documentation

## 2015-08-09 DIAGNOSIS — J9610 Chronic respiratory failure, unspecified whether with hypoxia or hypercapnia: Secondary | ICD-10-CM | POA: Insufficient documentation

## 2015-08-09 NOTE — Assessment & Plan Note (Signed)
Complicated by DJD and  prob OHS  Body mass index is 39.37 Lab Results  Component Value Date   TSH 1.060 12/24/2013     Contributing to gerd tendency/ doe/reviewed the need and the process to achieve and maintain neg calorie balance > defer f/u primary care including intermittently monitoring thyroid status

## 2015-08-09 NOTE — Assessment & Plan Note (Addendum)
Noct 02 at hs 2lpm since d/c 07/24/2015 (record says 4lpm, she says 2lpm) - probably complicated by cor pulmonale - see echo 07/22/15  - HCO3   07/23/15   =44  - ono 02 ordered 08/08/2015    Very likely this is OHS/ not copd related, and may need formal sleep study and even bipap hs if can't get this turned around.

## 2015-08-09 NOTE — Assessment & Plan Note (Deleted)
12/2013 PFT> Ratio 46%, FEV1  1.24L (54% pred, 44% change), TLC 6.17L (127% pred), DLCO 10.82 (48% pred)  Neither strength of dulera is approved for copd so it may well be she needs to be changed over to symbicort for insurance purposes but for now I gave her the dulera 200 and asked her to return in 2 weeks at which point any benefit from short term  prednisone should have washed out of her system  The proper method of use, as well as anticipated side effects, of a metered-dose inhaler are discussed and demonstrated to the patient. Improved effectiveness after extensive coaching during this visit to a level of approximately  75% but baseline is < 50% so needs more training  No indication for spiriva in GOLD II dz unless limited by sob, which is not the case at present nor was it chronically prior to apparent exac which had typical AB features so ICS/LABA better choice and less likely to be non-adherent the simpler we make her rx.    I had an extended discussion with the patient reviewing all relevant studies completed to date and  lasting 25  minutes of a 40 minute extended post hosp transition of care visit    Each maintenance medication was reviewed in detail including most importantly the difference between maintenance and prns and under what circumstances the prns are to be triggered using an action plan format that is not reflected in the computer generated alphabetically organized AVS.    Please see instructions for details which were reviewed in writing and the patient given a copy highlighting the part that I personally wrote and discussed at today's ov.

## 2015-08-09 NOTE — Assessment & Plan Note (Signed)
12/2013 PFT> Ratio 46%, FEV1  1.24L (54% pred, 44% change), TLC 6.17L (127% pred), DLCO 10.82 (48% pred)  Neither strength of dulera is approved for copd so it may well be she needs to be changed over to symbicort for insurance purposes but for now I gave her the dulera 200 and asked her to return in 2 weeks at which point any benefit from short term  prednisone should have washed out of her system  The proper method of use, as well as anticipated side effects, of a metered-dose inhaler are discussed and demonstrated to the patient. Improved effectiveness after extensive coaching during this visit to a level of approximately  75% but baseline is < 50% so needs more training  No indication for spiriva in GOLD II dz unless limited by sob, which is not the case at present nor was it chronically prior to apparent exac which had typical AB features so ICS/LABA better choice and less likely to be non-adherent the simpler we make her rx.    I had an extended discussion with the patient reviewing all relevant studies completed to date and  lasting 25  minutes of a 40 minute extended post hosp transition of care visit    Each maintenance medication was reviewed in detail including most importantly the difference between maintenance and prns and under what circumstances the prns are to be triggered using an action plan format that is not reflected in the computer generated alphabetically organized AVS.    Please see instructions for details which were reviewed in writing and the patient given a copy highlighting the part that I personally wrote and discussed at today's ov.

## 2015-08-09 NOTE — Assessment & Plan Note (Signed)
Echo [id number] Systolic function was normal. The estimated ejection fraction was in the range of 55% to 60%. Wall motion was normal; there were no regional wall motion abnormalities. Doppler parameters are consistent with abnormal left ventricular relaxation (grade 1 diastolic dysfunction). Doppler parameters are consistent with high ventricular filling pressure. - Aortic valve: Transvalvular velocity was increased. There was no stenosis. Valve area (VTI): 2.39 cm^2. - Mitral valve: Moderately calcified annulus. - Left atrium: The atrium was moderately dilated. - Right atrium: The atrium was mildly dilated. - Pulmonary arteries: Systolic pressure was moderately to severely increased. PA peak pressure: 65 mm Hg (S).  Acute PE excluded by CTa same date, rx is to treat the underlying problem > f/u Dr Lake Bells planned

## 2015-08-12 DIAGNOSIS — J188 Other pneumonia, unspecified organism: Secondary | ICD-10-CM | POA: Diagnosis not present

## 2015-08-12 DIAGNOSIS — J9620 Acute and chronic respiratory failure, unspecified whether with hypoxia or hypercapnia: Secondary | ICD-10-CM | POA: Diagnosis not present

## 2015-08-12 DIAGNOSIS — J441 Chronic obstructive pulmonary disease with (acute) exacerbation: Secondary | ICD-10-CM | POA: Diagnosis not present

## 2015-08-19 ENCOUNTER — Telehealth: Payer: Self-pay | Admitting: Internal Medicine

## 2015-08-19 DIAGNOSIS — J449 Chronic obstructive pulmonary disease, unspecified: Secondary | ICD-10-CM

## 2015-08-19 NOTE — Telephone Encounter (Signed)
Pt requesting ONO results from last week.   Pt had ono through [facility].  No results seen in Epic. Called Epic to request ONO results faxed to our office at [facility] attention. Will hold in triage to look out for results.

## 2015-08-20 NOTE — Telephone Encounter (Signed)
ONO has been received, in MW's folder up front. MW please advise on results.  Thanks!

## 2015-08-21 NOTE — Telephone Encounter (Signed)
Per MW- rec 2lpm o2 wityh sleep and recheck ONO on 2 lpm  LMTCB for the pt

## 2015-08-21 NOTE — Telephone Encounter (Signed)
Spoke with pt and notified of results per Dr. Melvyn Novas. Pt verbalized understanding and denied any questions. Order sent to [facility]

## 2015-09-02 ENCOUNTER — Encounter: Payer: Self-pay | Admitting: Adult Health

## 2015-09-02 ENCOUNTER — Ambulatory Visit (INDEPENDENT_AMBULATORY_CARE_PROVIDER_SITE_OTHER): Payer: Medicare Other | Admitting: Adult Health

## 2015-09-02 VITALS — BP 134/80 | HR 82 | Temp 98.2°F | Ht 65.0 in | Wt 237.0 lb

## 2015-09-02 DIAGNOSIS — J449 Chronic obstructive pulmonary disease, unspecified: Secondary | ICD-10-CM | POA: Diagnosis not present

## 2015-09-02 DIAGNOSIS — J9612 Chronic respiratory failure with hypercapnia: Secondary | ICD-10-CM | POA: Diagnosis not present

## 2015-09-02 DIAGNOSIS — J441 Chronic obstructive pulmonary disease with (acute) exacerbation: Secondary | ICD-10-CM

## 2015-09-02 DIAGNOSIS — J9611 Chronic respiratory failure with hypoxia: Secondary | ICD-10-CM

## 2015-09-02 NOTE — Patient Instructions (Addendum)
Try to wear Oxygen 2l/m At bedtime  Each night .  Continue on Dulera 2 puffs Twice daily  Follow up Dr. Lake Bells in 3 months and As needed

## 2015-09-03 NOTE — Progress Notes (Signed)
Subjective:    Patient ID: [name], female    DOB: 1950-09-20, 64 y.o.   MRN: [id number]  HPI 64 yo former smoker with COPD with asthmatic component   TES T 12/2013 PFT> Ratio 46%, FEV1 1.24L (54% pred, 44% change), TLC 6.17L (127% pred), DLCO 10.82 (48% pred),  CTA Chest 07/2015 >Trace right-sided pleural effusion, and minimal interlobular septal thickening. Diffuse emphysema 07/2015 Echo >EF 55-60%, LA mod dilated , PAP 65   09/02/15 Follow up : COPD , O2 dependent Returns for a two-week follow-up COPD. She says she is feeling better but continues to have sinus congestion, drainage. Seen 2 weeks ago , spiriva was stopped , Dulera was continued.  Patient was recently admitted for 11/ 2016 for COPD exacerbation with acute on chronic respiratory failure She did require initial BiPAP support.. She had an elevated BNP and improved with diuresis. Echo showed an EF of 55-60%, left atrial moderately dilated with elevated pulmonary pressures at 65. He was started on [place] and Spiriva at discharge.. Treated with antibiotics and steroids.. CT chest showed diffuse emphysema and a trace right-sided pleural effusion. She was started on oxygen 2 L at rest and 4 L with activity. She was recommended for an outpatient sleep study.  Patient says that she has not been wearing her oxygen as directed. We discussed dangers of hypoxemia. And encouraged her on oxygen compliance. She is very resistant to wear oxygen. sHe denies any hemoptysis, orthopnea, PND or leg swelling.    Review of Systems  Constitutional:   No  weight loss, night sweats,  Fevers, chills, + fatigue, or  lassitude.  HEENT:   No headaches,  Difficulty swallowing,  Tooth/dental problems, or  Sore throat,                No sneezing, itching, ear ache,  +nasal congestion, post nasal drip,   CV:  No chest pain,  Orthopnea, PND, swelling in lower extremities, anasarca, dizziness, palpitations, syncope.   GI  No heartburn,  indigestion, abdominal pain, nausea, vomiting, diarrhea, change in bowel habits, loss of appetite, bloody stools.   Resp:   No chest wall deformity  Skin: no rash or lesions.  GU: no dysuria, change in color of urine, no urgency or frequency.  No flank pain, no hematuria   MS:  No joint pain or swelling.  No decreased range of motion.  No back pain.  Psych:  No change in mood or affect. No depression or anxiety.  No memory loss.          Objective:   Physical Exam  Filed Vitals:   09/02/15 1147  BP: 134/80  Pulse: 82  Temp: 98.2 F (36.8 C)  TempSrc: Oral  Height: 5\' 5"  (1.651 m)  Weight: 237 lb (107.502 kg)  SpO2: 98%    GEN: A/Ox3; pleasant , NAD, elderly, obese   HEENT:  [place]/AT,  EACs-clear, TMs-wnl, NOSE-clear, THROAT-clear, no lesions, no postnasal drip or exudate noted.   NECK:  Supple w/ fair ROM; no JVD; normal carotid impulses w/o bruits; no thyromegaly or nodules palpated; no lymphadenopathy.  RESP  Decreased BS in bases no accessory muscle use, no dullness to percussion  CARD:  RRR, no m/r/g  , tr  peripheral edema, pulses intact, no cyanosis or clubbing.  GI:   Soft & nt; nml bowel sounds; no organomegaly or masses detected.  Musco: Warm bil, no deformities or joint swelling noted.   Neuro: alert, no focal deficits noted.  Skin: Warm, no lesions or rashes        Assessment & Plan:

## 2015-09-03 NOTE — Assessment & Plan Note (Addendum)
Pt does evidence of cor pulmonale and Pulmonary HTN on echo  Encouraged on O2 compliance  Would like to set up for sleep study ? Underlying OSA . -she declines sleep study -will discuss on return   Plan  Cont on O2

## 2015-09-03 NOTE — Assessment & Plan Note (Signed)
Recent flare now resolving    Plan  Try to wear Oxygen 2l/m At bedtime  Each night .  Continue on Dulera 2 puffs Twice daily  Follow up Dr. Lake Bells in 3 months and As needed

## 2015-09-04 ENCOUNTER — Telehealth: Payer: Self-pay | Admitting: Internal Medicine

## 2015-09-04 ENCOUNTER — Telehealth: Payer: Self-pay | Admitting: Adult Health

## 2015-09-04 NOTE — Telephone Encounter (Signed)
Per MW- ONO on 2lpm done by [facility] on 09-01-15 was normal  Spoke with pt and notified of results per Dr. Melvyn Novas. Pt verbalized understanding and denied any questions.

## 2015-09-04 NOTE — Telephone Encounter (Signed)
Discussed with pt that Sleep study needs to be set up. She declines at this time , wants to discuss on return

## 2015-09-04 NOTE — Telephone Encounter (Signed)
Spoke with pt. States that she is returning TP's phone call.

## 2015-09-05 NOTE — Assessment & Plan Note (Signed)
Try to wear Oxygen 2l/m At bedtime  Each night .  Continue on Dulera 2 puffs Twice daily  Follow up Dr. Lake Bells in 3 months and As needed

## 2015-09-10 ENCOUNTER — Ambulatory Visit (INDEPENDENT_AMBULATORY_CARE_PROVIDER_SITE_OTHER): Payer: Medicare Other | Admitting: Family Medicine

## 2015-09-10 ENCOUNTER — Encounter: Payer: Self-pay | Admitting: Family Medicine

## 2015-09-10 ENCOUNTER — Telehealth: Payer: Self-pay | Admitting: Pulmonary Disease

## 2015-09-10 ENCOUNTER — Ambulatory Visit: Payer: Self-pay | Admitting: Adult Health

## 2015-09-10 VITALS — BP 160/72 | HR 100 | Temp 98.0°F | Ht 65.0 in | Wt 232.3 lb

## 2015-09-10 DIAGNOSIS — R05 Cough: Secondary | ICD-10-CM | POA: Diagnosis present

## 2015-09-10 DIAGNOSIS — R059 Cough, unspecified: Secondary | ICD-10-CM

## 2015-09-10 MED ORDER — IPRATROPIUM BROMIDE 0.03 % NA SOLN: 2.0000 | Freq: Two times a day (BID) | NASAL | Status: DC

## 2015-09-10 MED ORDER — MOMETASONE FURO-FORMOTEROL FUM 100-5 MCG/ACT IN AERO: 2.0000 | INHALATION_SPRAY | Freq: Two times a day (BID) | RESPIRATORY_TRACT | Status: DC

## 2015-09-10 NOTE — Patient Instructions (Signed)
Thank you for coming in,   I have sent in Atrovent nasal spray which can help with the sinus congestion.  If you're coughing gets worse and sputum production increases, then please return as you may need antibiotics.  Please follow-up with Dr. Lajuana Ripple for a yearly physical  Sign up for My Chart to have easy access to your labs results, and communication with your Primary care physician   Please feel free to call with any questions or concerns at any time, at [phone]. --Dr. Raeford Razor Upper Respiratory Infection, Adult Most upper respiratory infections (URIs) are a viral infection of the air passages leading to the lungs. A URI affects the nose, throat, and upper air passages. The most common type of URI is nasopharyngitis and is typically referred to as "the common cold." URIs run their course and usually go away on their own. Most of the time, a URI does not require medical attention, but sometimes a bacterial infection in the upper airways can follow a viral infection. This is called a secondary infection. Sinus and middle ear infections are common types of secondary upper respiratory infections. Bacterial pneumonia can also complicate a URI. A URI can worsen asthma and chronic obstructive pulmonary disease (COPD). Sometimes, these complications can require emergency medical care and may be life threatening.  CAUSES Almost all URIs are caused by viruses. A virus is a type of germ and can spread from one person to another.  RISKS FACTORS You may be at risk for a URI if:   You smoke.   You have chronic heart or lung disease.  You have a weakened defense (immune) system.   You are very young or very old.   You have nasal allergies or asthma.  You work in crowded or poorly ventilated areas.  You work in health care facilities or schools. SIGNS AND SYMPTOMS  Symptoms typically develop 2-3 days after you come in contact with a cold virus. Most viral URIs last 7-10 days. However,  viral URIs from the influenza virus (flu virus) can last 14-18 days and are typically more severe. Symptoms may include:   Runny or stuffy (congested) nose.   Sneezing.   Cough.   Sore throat.   Headache.   Fatigue.   Fever.   Loss of appetite.   Pain in your forehead, behind your eyes, and over your cheekbones (sinus pain).  Muscle aches.  DIAGNOSIS  Your health care provider may diagnose a URI by:  Physical exam.  Tests to check that your symptoms are not due to another condition such as:  Strep throat.  Sinusitis.  Pneumonia.  Asthma. TREATMENT  A URI goes away on its own with time. It cannot be cured with medicines, but medicines may be prescribed or recommended to relieve symptoms. Medicines may help:  Reduce your fever.  Reduce your cough.  Relieve nasal congestion. HOME CARE INSTRUCTIONS   Take medicines only as directed by your health care provider.   Gargle warm saltwater or take cough drops to comfort your throat as directed by your health care provider.  Use a warm mist humidifier or inhale steam from a shower to increase air moisture. This may make it easier to breathe.  Drink enough fluid to keep your urine clear or pale yellow.   Eat soups and other clear broths and maintain good nutrition.   Rest as needed.   Return to work when your temperature has returned to normal or as your health care provider advises. You may need  to stay home longer to avoid infecting others. You can also use a face mask and careful hand washing to prevent spread of the virus.  Increase the usage of your inhaler if you have asthma.   Do not use any tobacco products, including cigarettes, chewing tobacco, or electronic cigarettes. If you need help quitting, ask your health care provider. PREVENTION  The best way to protect yourself from getting a cold is to practice good hygiene.   Avoid oral or hand contact with people with cold symptoms.   Wash  your hands often if contact occurs.  There is no clear evidence that vitamin C, vitamin E, echinacea, or exercise reduces the chance of developing a cold. However, it is always recommended to get plenty of rest, exercise, and practice good nutrition.  SEEK MEDICAL CARE IF:   You are getting worse rather than better.   Your symptoms are not controlled by medicine.   You have chills.  You have worsening shortness of breath.  You have brown or red mucus.  You have yellow or brown nasal discharge.  You have pain in your face, especially when you bend forward.  You have a fever.  You have swollen neck glands.  You have pain while swallowing.  You have white areas in the back of your throat. SEEK IMMEDIATE MEDICAL CARE IF:   You have severe or persistent:  Headache.  Ear pain.  Sinus pain.  Chest pain.  You have chronic lung disease and any of the following:  Wheezing.  Prolonged cough.  Coughing up blood.  A change in your usual mucus.  You have a stiff neck.  You have changes in your:  Vision.  Hearing.  Thinking.  Mood. MAKE SURE YOU:   Understand these instructions.  Will watch your condition.  Will get help right away if you are not doing well or get worse.   This information is not intended to replace advice given to you by your health care provider. Make sure you discuss any questions you have with your health care provider.   Document Released: 02/23/2001 Document Revised: 01/14/2015 Document Reviewed: 12/05/2013 Elsevier Interactive Patient Education [employer].

## 2015-09-10 NOTE — Progress Notes (Signed)
   Subjective:    Patient ID: [name], female    DOB: 1951/01/09, 64 y.o.   MRN: [id number]  Seen for Same day visit for   CC: Cough. Hx of HFpEF with grade 1 DD and COPD  Has been followed by pulmonology  She will continued using oxygen 2L at night  She feels that the O2 at night blocks her sinuses  She has been in bed the last week.  Has been having sinus congestion.  Have some phlegm production that is clear  Has had some improvement since yesterday  Has had bad coughing spells  Medications tried: nyquil   Symptoms Runny nose: yes Mucous in back of throat: yes  Throat burning or reflux: no Wheezing or asthma: yes Fever: no Chest Pain: no Shortness of breath: yes Leg swelling: yes  Hemoptysis: no Weight loss: no   Given sheet for mammogram   Review of Systems   See HPI for ROS. Objective:  BP 160/72 mmHg  Pulse 100  Temp(Src) 98 F (36.7 C) (Oral)  Ht 5\' 5"  (1.651 m)  Wt 232 lb 4.8 oz (105.371 kg)  BMI 38.66 kg/m2  General: NAD HEENT: Tympanic membranes clear and intact, clear conjunctiva, oropharynx clear, moist mucous membranes, no cervical lymphadenopathy Cardiac: Mildly tachycardic, regular rhythm, normal heart sounds, no murmurs.  Respiratory: Mild expiratory wheeze wheeze, no crackles, normal effort Extremities: WWP. Skin: warm and dry, no rashes noted Neuro: alert and oriented, no focal deficits      Assessment & Plan:   Cough Moving air well throughout with normal oxygenation and mild to none wheezing Looks well today Suspect that she has a viral illness causing postnasal drip - Sent in Atrovent nasal spray - Counseled on the fact that if her breathing and coughing becomes worse then she should follow-up for concern of another COPD exacerbation which would require steroids and antibiotics. She understood these concerns

## 2015-09-10 NOTE — Telephone Encounter (Signed)
Called and spoke with patient. She stated that when she picked up her [facility] the inhaler costed $300. She stated that the cost of the inhaler was to much. I asked her if she had contacted her insurance to get a formulary so that we could see what her insurance would cover and she states she does not have prescription cover. I called [place] to see if Ruthe Mannan would be cheaper for the patient, at [place] the cost would be $298.85. I explained to the patient that I would place patient assistant forms at the front office for [facility] and samples. Once forms are completed I asked that she bring the forms back to our office so that we can fax them to the patient assistant program. She voiced understanding and had no further questions. Samples are logged out on clipboard and in epic. Samples and Dulera assistance forms have been placed at the front for pick up. Nothing further needed.

## 2015-09-12 ENCOUNTER — Encounter: Payer: Self-pay | Admitting: Internal Medicine

## 2015-09-12 DIAGNOSIS — R05 Cough: Secondary | ICD-10-CM | POA: Insufficient documentation

## 2015-09-12 DIAGNOSIS — R059 Cough, unspecified: Secondary | ICD-10-CM | POA: Insufficient documentation

## 2015-09-12 NOTE — Assessment & Plan Note (Signed)
Moving air well throughout with normal oxygenation and mild to none wheezing Looks well today Suspect that she has a viral illness causing postnasal drip - Sent in Atrovent nasal spray - Counseled on the fact that if her breathing and coughing becomes worse then she should follow-up for concern of another COPD exacerbation which would require steroids and antibiotics. She understood these concerns

## 2015-09-16 ENCOUNTER — Telehealth: Payer: Self-pay | Admitting: Pulmonary Disease

## 2015-09-16 NOTE — Telephone Encounter (Signed)
Will forward to Ashley 

## 2015-09-18 NOTE — Telephone Encounter (Signed)
Form has been filled out to the best of my ability and put in BQ's look-at to sign

## 2015-09-24 NOTE — Telephone Encounter (Signed)
Show to me on Thursday PM please

## 2015-09-24 NOTE — Telephone Encounter (Signed)
Per Caryl Pina BQ still has forms. Will await his return

## 2015-09-24 NOTE — Telephone Encounter (Signed)
Have these forms been signed by BQ? Thanks.

## 2015-09-26 NOTE — Telephone Encounter (Signed)
Were these forms given to BQ? Thanks.

## 2015-09-29 NOTE — Telephone Encounter (Signed)
Forms received from [facility] to follow up on pt assistance fax #, office was closed for [facility] holiday.  Wcb.

## 2015-09-30 NOTE — Telephone Encounter (Signed)
Called [employer] at [phone], was told that "they do not have a fax machine" and that forms will need to be mailed.  I was also advised that they will not reach out to [place] to keep [place] updated with the assistance application, but it it is solely our responsibility to follow up on this.  Original form made with a copy made for myself to hold onto.  Was advised that the application takes 2-3 weeks to be processed once it is received by their office. Will hold copy of application and follow up on.

## 2015-10-02 NOTE — Telephone Encounter (Signed)
[phone] pt calling back on her meds

## 2015-10-02 NOTE — Telephone Encounter (Signed)
Returned call to patient - aware that forms have been mailed and that it can take up to 2-3 weeks to be processed once received. Aware also that Caryl Pina has a copy of these forms to follow up on. Pt aware that she can also call us at anytime to follow up. Will send to [facility] for further follow up.

## 2015-10-09 NOTE — Telephone Encounter (Signed)
[phone] calling back

## 2015-10-09 NOTE — Telephone Encounter (Signed)
Called [employer] at [phone] and spoke with Ava.  She advised me that the forms have not been processed yet and that we should check back with them next week because it takes 2-3 weeks before they are processed.  Awaiting forms to be processed. Attempted to contact patient to advise of above, left message for patient to call back.

## 2015-10-09 NOTE — Telephone Encounter (Signed)
I called made pt aware of below. She verbalized understanding

## 2015-11-05 NOTE — Telephone Encounter (Signed)
Samples have not been picked up.  Placed back in stock.

## 2015-12-08 ENCOUNTER — Ambulatory Visit: Payer: Self-pay | Admitting: Pulmonary Disease

## 2015-12-29 ENCOUNTER — Encounter: Payer: Self-pay | Admitting: Family Medicine

## 2015-12-29 ENCOUNTER — Ambulatory Visit (INDEPENDENT_AMBULATORY_CARE_PROVIDER_SITE_OTHER): Payer: Medicare Other | Admitting: Family Medicine

## 2015-12-29 ENCOUNTER — Other Ambulatory Visit (HOSPITAL_COMMUNITY)
Admission: RE | Admit: 2015-12-29 | Discharge: 2015-12-29 | Disposition: A | Discharge status: Home / Self Care | Payer: Medicare Other | Source: Ambulatory Visit | Attending: Family Medicine | Admitting: Family Medicine

## 2015-12-29 VITALS — BP 168/82 | HR 76 | Temp 98.4°F | Ht 65.0 in | Wt 241.6 lb

## 2015-12-29 DIAGNOSIS — Z1159 Encounter for screening for other viral diseases: Secondary | ICD-10-CM | POA: Diagnosis not present

## 2015-12-29 DIAGNOSIS — Z0000 Encounter for general adult medical examination without abnormal findings: Secondary | ICD-10-CM | POA: Diagnosis not present

## 2015-12-29 DIAGNOSIS — F17200 Nicotine dependence, unspecified, uncomplicated: Secondary | ICD-10-CM | POA: Insufficient documentation

## 2015-12-29 DIAGNOSIS — Z124 Encounter for screening for malignant neoplasm of cervix: Secondary | ICD-10-CM

## 2015-12-29 DIAGNOSIS — Z01419 Encounter for gynecological examination (general) (routine) without abnormal findings: Secondary | ICD-10-CM | POA: Insufficient documentation

## 2015-12-29 DIAGNOSIS — Z1151 Encounter for screening for human papillomavirus (HPV): Secondary | ICD-10-CM | POA: Insufficient documentation

## 2015-12-29 DIAGNOSIS — L309 Dermatitis, unspecified: Secondary | ICD-10-CM

## 2015-12-29 MED ORDER — HYDROCORTISONE 0.5 % EX CREA: 1.0000 "application " | TOPICAL_CREAM | Freq: Two times a day (BID) | CUTANEOUS | Status: DC

## 2015-12-29 NOTE — Patient Instructions (Addendum)
I will send you a copy of your pap smear results in the next week or so.  Please schedule your mammogram and colonoscopy.  If your colonoscopy is normal, this should be the last one you have to do.  You should consider TED hose/ compression hose to help with the swelling in your lower extremities.  Keep them elevated when you are seated and watch your salt intake to decrease your swelling.  Colonoscopy A colonoscopy is an exam to look at your colon. This exam can help find lumps (tumors), growths (polyps), bleeding, and redness and puffiness (inflammation) in your colon.  BEFORE THE PROCEDURE  Ask your doctor about changing or stopping your regular medicines.  You may need to drink a large amount of a special liquid (oral bowel prep). You start drinking this the day before your procedure. It will cause you to have watery poop (stool). This cleans out your colon.  Do not eat or drink anything else once you have started the bowel prep, unless your doctor tells you it is safe to do so.  Make plans for someone to drive you home after the procedure. PROCEDURE  You will be given medicine to help you relax (sedative).  You will lie on your side with your knees bent.  A tube with a camera on the end is put in the opening of your butt (anus) and into your colon. Pictures are sent to a computer screen. Your doctor will look for anything that is not normal.  Your doctor may take a tissue sample (biopsy) from your colon to be looked at more closely.  The exam is finished when your doctor has viewed all of the colon. AFTER THE PROCEDURE  Do not drive for 24 hours after the exam.  You may have a small amount of blood in your poop. This is normal.  You may pass gas and have belly (abdominal) cramps. This is normal.  Ask when your test results will be ready. Make sure you get your test results.   This information is not intended to replace advice given to you by your health care provider. Make sure  you discuss any questions you have with your health care provider.   Document Released: 10/02/2010 Document Revised: 09/04/2013 Document Reviewed: 05/07/2013 Elsevier Interactive Patient Education [employer].

## 2015-12-29 NOTE — Progress Notes (Signed)
[name] is a 65 y.o. female presents to office today for annual physical exam examination.  Concerns today include:  Vaginal rash Has been present for close to a year.  Notes increased moisture in that area.  Pruritic, relieved by coconut lotion.  No bleeding or skin break down.  Only itches externally.  No abnormal vaginal discharge.  No vaginal bleeding.  Notes urinary incontinence.  Endorses overflow incontinence.  Last eye exam: 2015 Last dental exam: dentures Last colonoscopy: never had one, has done stool cards in the past. Last mammogram: 2015 Last pap smear: >8-10 years.  Never had abnormal result per patient. Immunizations needed: Tdap, PNA Refills needed today: none  Past Medical History  Diagnosis Date  . Psoriasis   . Macular degeneration   . Abnormal weight gain 08/14/2009    Qualifier: Diagnosis of  By: Lindell Noe MD, Jeneen Rinks    . Tobacco abuse   . Allergic rhinitis   . Hypertension   . Cancer [facility])     Breast CA left  . Arthritis     osteo  . Anemia   . Hypocalcemia   . Edema extremities     lower  . Fatigue   . Hypoalbuminemia   . Pneumonia   . CAP (community acquired pneumonia)   . COPD (chronic obstructive pulmonary disease) ([place])   . Acute respiratory failure [facility])    Social History   Social History  . Marital Status: Divorced    Spouse Name: N/A  . Number of Children: N/A  . Years of Education: N/A   Occupational History  . Not on file.   Social History Main Topics  . Smoking status: Former Smoker -- 0.50 packs/day for 40 years    Types: Cigarettes    Quit date: 12/15/2013  . Smokeless tobacco: Never Used  . Alcohol Use: No  . Drug Use: Not on file  . Sexual Activity: Not on file   Other Topics Concern  . Not on file   Social History Narrative   Past Surgical History  Procedure Laterality Date  . Left knee replacement  1995  . Gastric bypass  May 2004  . Breast surgery     Family History  Problem Relation Age of Onset  .  Osteoarthritis Mother   . Hypertension Mother   . Thyroid disease Mother     ROS: Review of Systems Constitutional: negative Eyes: positive for contacts/glasses Ears, nose, mouth, throat, and face: negative Respiratory: positive for COPD Cardiovascular: negative Gastrointestinal: negative Genitourinary:positive for external vaginal pruritis Integument/breast: positive for h/o breast cancer Hematologic/lymphatic: negative Musculoskeletal:positive for back pain Neurological: negative Behavioral/Psych: negative Endocrine: negative Allergic/Immunologic: negative   Physical exam BP 168/82 mmHg  Pulse 76  Temp(Src) 98.4 F (36.9 C) (Oral)  Ht 5\' 5"  (1.651 m)  Wt 241 lb 9.6 oz (109.589 kg)  BMI 40.20 kg/m2  SpO2 95% General appearance: alert, cooperative, appears stated age and no distress Head: Normocephalic, without obvious abnormality, atraumatic Eyes: negative findings: lids and lashes normal, conjunctivae and sclerae normal, corneas clear and pupils equal, round, reactive to light and accomodation Ears: normal TM's and external ear canals both ears Nose: Nares normal. Septum midline. Mucosa normal. No drainage or sinus tenderness. Throat: normal findings: lips normal without lesions, buccal mucosa normal, palate normal, tongue midline and normal and soft palate, uvula, and tonsils normal and abnormal findings: dentures Neck: no adenopathy, supple, symmetrical, trachea midline and thyroid not enlarged, symmetric, no tenderness/mass/nodules Back: symmetric, no curvature. ROM normal. No  CVA tenderness. Lungs: diminished breath sounds globally.  no wheeze, rhonchi.  normal WOB on room air Heart: regular rate and rhythm, S1, S2 normal, no murmur, click, rub or gallop Abdomen: soft, non-tender; bowel sounds normal; no masses,  no organomegaly Pelvic: cervix normal in appearance, no adnexal masses or tenderness, no cervical motion tenderness, rectovaginal septum normal, uterus normal  size, shape, and consistency, vagina normal without discharge and external vaginal tissue atrophic Extremities: edema 1-2+ to mid shins bilaterally, no ulcers, gangrene or trophic changes and venous stasis dermatitis noted Pulses: 1+ posterior tibial pulses Skin: venous stasis dermatitis bilateral LE to mid shins, psoriatic plaques on bilateral knuckles and knees.  external vagina with perineal excoriation and exoriation of inguinal folds.  no satellite lesions.  non exudative. nonbleeding. Lymph nodes: Cervical, supraclavicular, and axillary nodes normal. Neurologic: Alert and oriented X 3, normal strength and tone. Normal symmetric reflexes. Normal coordination and gait  Assessment/ Plan: [name] is a 65 y.o. female here for annual PE  1. Annual physical exam - Discussed smoking cessation.  - Medical hx updated - Colonoscopy and mammogram information given.  Patient to schedule.  2. Screening for cervical cancer - Cytology - PAP ([place]); HPV cotesting also performed  3. Need for hepatitis C screening test - Hepatitis C antibody screen  4. Dermatitis.  Does not appear to be fungal.  Has a history of psoriasis.  Would be an odd place to have a flare.  Will treat with topical steroids.  However, would consider antifungal if no improvement on topical steroid in next 2 weeks.  Does not seem to involve the internal vaginal mucosa.  If she develops itching in this area would consider dx of lichen sclerosis and treat with clobetasol cr. - hydrocortisone cream 0.5 %; Apply 1 application topically 2 (two) times daily.  Dispense: 30 g; Refill: 0 - Return precautions reviewed with patient. Consider bx of tissue if no resolution.  5. Tobacco use disorder, precontemplative. - Discussed smoking cessation.  Smokes 4 cigs/ day.  No desire to quit.  Seems defensive when we discuss her dx of COPD.  She seems to be in denial that smoking is contributing.  Has f/u with Dr Lake Bells soon.  Declines  sleep study. - Will continue to readdress this at each visit   Elfreda Blanchet M. Lajuana Ripple, DO PGY-2, [place]

## 2015-12-30 LAB — HEPATITIS C ANTIBODY: HCV Ab: NEGATIVE

## 2015-12-31 ENCOUNTER — Ambulatory Visit (INDEPENDENT_AMBULATORY_CARE_PROVIDER_SITE_OTHER): Payer: Medicare Other | Admitting: Pulmonary Disease

## 2015-12-31 ENCOUNTER — Encounter: Payer: Self-pay | Admitting: Family Medicine

## 2015-12-31 ENCOUNTER — Encounter: Payer: Self-pay | Admitting: Pulmonary Disease

## 2015-12-31 VITALS — BP 136/84 | HR 76 | Ht 65.0 in | Wt 241.0 lb

## 2015-12-31 DIAGNOSIS — J432 Centrilobular emphysema: Secondary | ICD-10-CM | POA: Diagnosis not present

## 2015-12-31 DIAGNOSIS — J9611 Chronic respiratory failure with hypoxia: Secondary | ICD-10-CM

## 2015-12-31 DIAGNOSIS — J9612 Chronic respiratory failure with hypercapnia: Secondary | ICD-10-CM | POA: Diagnosis not present

## 2015-12-31 DIAGNOSIS — J438 Other emphysema: Secondary | ICD-10-CM | POA: Insufficient documentation

## 2015-12-31 DIAGNOSIS — F17200 Nicotine dependence, unspecified, uncomplicated: Secondary | ICD-10-CM

## 2015-12-31 LAB — CYTOLOGY - PAP

## 2015-12-31 NOTE — Assessment & Plan Note (Addendum)
[name] is a challenging patient. Specifically, she does not believe that COPD is a diagnosis and she does not believe that tobacco causes lung problems. She says that because she feels well she doesn't believe that she has a problem with her lungs.  I spent over 30 minutes face-to-face with [name] today discussing the fact that she's been hospitalized twice for severe respiratory failure requiring some form of mechanical ventilation, most recently in November 2016. Further, her pulmonary function testing clearly shows COPD. I also explained to her the pathophysiology of COPD in the setting of tobacco use.  During our conversation I attempted many different ways to try to express to [name] that I'm concerned about her and I don't want her to have respiratory failure again. However, based on the fact that she starts with a believe system that there is no such thing as COPD and tobacco use does not cause lung disease, I'm not sure we are ever going to be able to breakthrough.  I did tell her in great detail today that the fact that she is still smoking cigarettes and not using Dulera is going to put her at high risk of death from COPD.  Plan: I recommended that she stop smoking I recommended that she take Dulera twice a day Check ambulatory oximetry today in clinic, if negative okay to DC supplemental O2 as she is not using it anyway. F/U 6 months

## 2015-12-31 NOTE — Progress Notes (Signed)
Subjective:    Patient ID: [name], female    DOB: 11-10-1950, 65 y.o.   MRN: [id number]  Synopsis: First referred to [facility] in 2015 in the setting of a COPD exacerbation requiring mechanical ventilation. She was then hospitalized in November 2016 for severe hypercapnic respiratory failure requiring BiPAP for nearly 24 hours. She was treated for a COPD exacerbation as well as CHF. 12/2013 PFT> Ratio 46%, FEV1  1.24L (54% pred, 44% change), TLC 6.17L (127% pred), DLCO 10.82 (48% pred),   HPI Chief Complaint  Patient presents with  . Follow-up    pt doing well- notes occasional sinus congestion but attributes this to pollen.    [name] is taking the [facility] occasionally.  She only takes it as needed.  She hasn't used oxygen Either. She says that she has been breathing fine and she has no problems. She has been having some sinus congestion with all the pollen out recently.   Past Medical History  Diagnosis Date  . Psoriasis   . Macular degeneration   . Abnormal weight gain 08/14/2009    Qualifier: Diagnosis of  By: Tammy Noe MD, [name]    . Tobacco abuse   . Allergic rhinitis   . Hypertension   . Arthritis     osteo  . Anemia   . Hypocalcemia   . Edema extremities     lower  . Fatigue   . Hypoalbuminemia   . Pneumonia   . CAP (community acquired pneumonia)   . COPD (chronic obstructive pulmonary disease) ([place])   . Acute respiratory failure ([place])   . Cancer [facility])     Breast CA left. s/p radiation and lumpectomy.      Review of Systems     Objective:   Physical Exam  Filed Vitals:   12/31/15 1124  BP: 136/84  Pulse: 76  Height: 5\' 5"  (1.651 m)  Weight: 241 lb (109.317 kg)  SpO2: 95%  RA  Gen: obese, but well appearing HENT: OP clear, TM's clear, neck supple PULM: CTA B, normal percussion CV: RRR, no mgr, trace edema GI: BS+, soft, nontender Derm: no cyanosis or rash Psyche: normal mood and affect  Records from her November 2016 hospital visit  reviewed Records from our clinic visit since the last visit were reviewed November 2016 CT chest images personally reviewed showing diffuse centrilobular emphysema     Assessment & Plan:  Chronic respiratory failure with hypoxia and hypercapnia (HCC) [name] wants to discontinue her oxygen. I recommended against this. However, she says she hasn't used it in quite some time. Her ambulatory oximetry today was normal.  COPD with emphysema ([name]) [name] is a challenging patient. Specifically, she does not believe that COPD is a diagnosis and she does not believe that tobacco causes lung problems. She says that because she feels well she doesn't believe that she has a problem with her lungs.  I spent over 30 minutes face-to-face with [name] today discussing the fact that she's been hospitalized twice for severe respiratory failure requiring some form of mechanical ventilation, most recently in November 2016. Further, her pulmonary function testing clearly shows COPD. I also explained to her the pathophysiology of COPD in the setting of tobacco use.  During our conversation I attempted many different ways to try to express to [name] that I'm concerned about her and I don't want her to have respiratory failure again. However, based on the fact that she starts with a believe system that there is no such thing  as COPD and tobacco use does not cause lung disease, I'm not sure we are ever going to be able to breakthrough.  I did tell her in great detail today that the fact that she is still smoking cigarettes and not using Dulera is going to put her at high risk of death from COPD.  Plan: I recommended that she stop smoking I recommended that she take Dulera twice a day Check ambulatory oximetry today in clinic, if negative okay to DC supplemental O2 as she is not using it anyway. F/U 6 months  Tobacco use disorder She initially told me that she was not smoking cigarettes during our visit but on further  questioning she told me that she does smoke 4 cigarettes a day. I explained to her that this will cause further deterioration in her lung disease and I recommend that she stop immediately.   > 45 minutes spent on this visit, > 50% face to face  Current outpatient prescriptions:  .  hydrocortisone cream 0.5 %, Apply 1 application topically 2 (two) times daily., Disp: 30 g, Rfl: 0 .  mometasone-formoterol (DULERA) 100-5 MCG/ACT AERO, Inhale 2 puffs into the lungs 2 (two) times daily. (Patient taking differently: Inhale 2 puffs into the lungs 2 (two) times daily as needed. ), Disp: 1 Inhaler, Rfl: 1 .  Multiple Vitamin (MULTIVITAMIN WITH MINERALS) TABS tablet, Take 1 tablet by mouth daily., Disp: , Rfl:

## 2015-12-31 NOTE — Assessment & Plan Note (Signed)
She initially told me that she was not smoking cigarettes during our visit but on further questioning she told me that she does smoke 4 cigarettes a day. I explained to her that this will cause further deterioration in her lung disease and I recommend that she stop immediately.

## 2015-12-31 NOTE — Patient Instructions (Signed)
We will see you back in 6 months or sooner if needed I recommend that you stop smoking cigarettes immediately I recommend that you take Dulera 2 puffs twice a day

## 2015-12-31 NOTE — Assessment & Plan Note (Addendum)
[name] wants to discontinue her oxygen. I recommended against this. However, she says she hasn't used it in quite some time. Her ambulatory oximetry today was normal.

## 2016-02-12 ENCOUNTER — Telehealth: Payer: Self-pay | Admitting: Pulmonary Disease

## 2016-02-12 DIAGNOSIS — J9611 Chronic respiratory failure with hypoxia: Secondary | ICD-10-CM

## 2016-02-12 DIAGNOSIS — J9612 Chronic respiratory failure with hypercapnia: Principal | ICD-10-CM

## 2016-02-12 NOTE — Telephone Encounter (Signed)
Spoke with pt. States that she wants her oxygen picked up. She is no longer using this, she has not used it since February. DME is [facility].  BQ - please advise if we can discontinue oxygen use.

## 2016-02-13 NOTE — Telephone Encounter (Signed)
Spoke with pt. She is aware that we will be sending an order to [facility]. Order has been placed. Nothing further was needed.

## 2016-02-13 NOTE — Telephone Encounter (Signed)
OK 

## 2016-08-20 ENCOUNTER — Other Ambulatory Visit: Payer: Self-pay

## 2016-09-07 DIAGNOSIS — H40003 Preglaucoma, unspecified, bilateral: Secondary | ICD-10-CM | POA: Diagnosis not present

## 2016-09-24 ENCOUNTER — Encounter: Payer: Self-pay | Admitting: Adult Health

## 2016-09-24 ENCOUNTER — Inpatient Hospital Stay (HOSPITAL_COMMUNITY): Payer: Medicare Other

## 2016-09-24 ENCOUNTER — Encounter (HOSPITAL_COMMUNITY): Payer: Self-pay | Admitting: General Practice

## 2016-09-24 ENCOUNTER — Inpatient Hospital Stay (HOSPITAL_COMMUNITY)
Admission: AD | Admit: 2016-09-24 | Discharge: 2016-10-04 | DRG: 208 | Disposition: A | Discharge status: Skilled Nursing Facility | Payer: Medicare Other | Source: Ambulatory Visit | Attending: Family Medicine | Admitting: Family Medicine

## 2016-09-24 ENCOUNTER — Ambulatory Visit (INDEPENDENT_AMBULATORY_CARE_PROVIDER_SITE_OTHER): Payer: Medicare Other | Admitting: Adult Health

## 2016-09-24 DIAGNOSIS — E872 Acidosis: Secondary | ICD-10-CM | POA: Diagnosis not present

## 2016-09-24 DIAGNOSIS — I5030 Unspecified diastolic (congestive) heart failure: Secondary | ICD-10-CM | POA: Diagnosis not present

## 2016-09-24 DIAGNOSIS — M1990 Unspecified osteoarthritis, unspecified site: Secondary | ICD-10-CM | POA: Diagnosis not present

## 2016-09-24 DIAGNOSIS — R0902 Hypoxemia: Secondary | ICD-10-CM | POA: Diagnosis not present

## 2016-09-24 DIAGNOSIS — J9622 Acute and chronic respiratory failure with hypercapnia: Secondary | ICD-10-CM | POA: Diagnosis present

## 2016-09-24 DIAGNOSIS — R531 Weakness: Secondary | ICD-10-CM | POA: Diagnosis not present

## 2016-09-24 DIAGNOSIS — J9621 Acute and chronic respiratory failure with hypoxia: Secondary | ICD-10-CM | POA: Diagnosis not present

## 2016-09-24 DIAGNOSIS — D696 Thrombocytopenia, unspecified: Secondary | ICD-10-CM | POA: Diagnosis not present

## 2016-09-24 DIAGNOSIS — J9612 Chronic respiratory failure with hypercapnia: Secondary | ICD-10-CM

## 2016-09-24 DIAGNOSIS — Z853 Personal history of malignant neoplasm of breast: Secondary | ICD-10-CM

## 2016-09-24 DIAGNOSIS — J9601 Acute respiratory failure with hypoxia: Secondary | ICD-10-CM

## 2016-09-24 DIAGNOSIS — J9611 Chronic respiratory failure with hypoxia: Secondary | ICD-10-CM

## 2016-09-24 DIAGNOSIS — I2781 Cor pulmonale (chronic): Secondary | ICD-10-CM | POA: Diagnosis present

## 2016-09-24 DIAGNOSIS — Z79899 Other long term (current) drug therapy: Secondary | ICD-10-CM | POA: Diagnosis not present

## 2016-09-24 DIAGNOSIS — I5033 Acute on chronic diastolic (congestive) heart failure: Secondary | ICD-10-CM | POA: Diagnosis not present

## 2016-09-24 DIAGNOSIS — Z6838 Body mass index (BMI) 38.0-38.9, adult: Secondary | ICD-10-CM

## 2016-09-24 DIAGNOSIS — Z720 Tobacco use: Secondary | ICD-10-CM | POA: Diagnosis not present

## 2016-09-24 DIAGNOSIS — J9600 Acute respiratory failure, unspecified whether with hypoxia or hypercapnia: Secondary | ICD-10-CM | POA: Diagnosis not present

## 2016-09-24 DIAGNOSIS — Z96652 Presence of left artificial knee joint: Secondary | ICD-10-CM | POA: Diagnosis present

## 2016-09-24 DIAGNOSIS — J9690 Respiratory failure, unspecified, unspecified whether with hypoxia or hypercapnia: Secondary | ICD-10-CM

## 2016-09-24 DIAGNOSIS — T17908A Unspecified foreign body in respiratory tract, part unspecified causing other injury, initial encounter: Secondary | ICD-10-CM

## 2016-09-24 DIAGNOSIS — J449 Chronic obstructive pulmonary disease, unspecified: Secondary | ICD-10-CM

## 2016-09-24 DIAGNOSIS — I110 Hypertensive heart disease with heart failure: Secondary | ICD-10-CM | POA: Diagnosis present

## 2016-09-24 DIAGNOSIS — R197 Diarrhea, unspecified: Secondary | ICD-10-CM | POA: Diagnosis present

## 2016-09-24 DIAGNOSIS — M6281 Muscle weakness (generalized): Secondary | ICD-10-CM | POA: Diagnosis not present

## 2016-09-24 DIAGNOSIS — E876 Hypokalemia: Secondary | ICD-10-CM

## 2016-09-24 DIAGNOSIS — R4189 Other symptoms and signs involving cognitive functions and awareness: Secondary | ICD-10-CM

## 2016-09-24 DIAGNOSIS — R410 Disorientation, unspecified: Secondary | ICD-10-CM | POA: Diagnosis not present

## 2016-09-24 DIAGNOSIS — I2729 Other secondary pulmonary hypertension: Secondary | ICD-10-CM | POA: Diagnosis present

## 2016-09-24 DIAGNOSIS — J441 Chronic obstructive pulmonary disease with (acute) exacerbation: Secondary | ICD-10-CM | POA: Diagnosis not present

## 2016-09-24 DIAGNOSIS — I2609 Other pulmonary embolism with acute cor pulmonale: Secondary | ICD-10-CM | POA: Diagnosis present

## 2016-09-24 DIAGNOSIS — E870 Hyperosmolality and hypernatremia: Secondary | ICD-10-CM | POA: Diagnosis not present

## 2016-09-24 DIAGNOSIS — Z4682 Encounter for fitting and adjustment of non-vascular catheter: Secondary | ICD-10-CM | POA: Diagnosis not present

## 2016-09-24 DIAGNOSIS — D509 Iron deficiency anemia, unspecified: Secondary | ICD-10-CM | POA: Diagnosis present

## 2016-09-24 DIAGNOSIS — R488 Other symbolic dysfunctions: Secondary | ICD-10-CM | POA: Diagnosis not present

## 2016-09-24 DIAGNOSIS — G9340 Encephalopathy, unspecified: Secondary | ICD-10-CM | POA: Diagnosis not present

## 2016-09-24 DIAGNOSIS — R29818 Other symptoms and signs involving the nervous system: Secondary | ICD-10-CM | POA: Diagnosis not present

## 2016-09-24 DIAGNOSIS — D638 Anemia in other chronic diseases classified elsewhere: Secondary | ICD-10-CM | POA: Diagnosis not present

## 2016-09-24 DIAGNOSIS — J9610 Chronic respiratory failure, unspecified whether with hypoxia or hypercapnia: Secondary | ICD-10-CM | POA: Diagnosis present

## 2016-09-24 DIAGNOSIS — J439 Emphysema, unspecified: Secondary | ICD-10-CM | POA: Diagnosis not present

## 2016-09-24 DIAGNOSIS — R0600 Dyspnea, unspecified: Secondary | ICD-10-CM | POA: Diagnosis not present

## 2016-09-24 DIAGNOSIS — I2721 Secondary pulmonary arterial hypertension: Secondary | ICD-10-CM | POA: Diagnosis not present

## 2016-09-24 DIAGNOSIS — J438 Other emphysema: Secondary | ICD-10-CM | POA: Diagnosis not present

## 2016-09-24 DIAGNOSIS — F17210 Nicotine dependence, cigarettes, uncomplicated: Secondary | ICD-10-CM | POA: Diagnosis present

## 2016-09-24 DIAGNOSIS — R0603 Acute respiratory distress: Secondary | ICD-10-CM | POA: Diagnosis not present

## 2016-09-24 DIAGNOSIS — G4733 Obstructive sleep apnea (adult) (pediatric): Secondary | ICD-10-CM | POA: Diagnosis not present

## 2016-09-24 DIAGNOSIS — J9602 Acute respiratory failure with hypercapnia: Secondary | ICD-10-CM | POA: Diagnosis not present

## 2016-09-24 DIAGNOSIS — R0602 Shortness of breath: Secondary | ICD-10-CM | POA: Diagnosis not present

## 2016-09-24 DIAGNOSIS — Z9981 Dependence on supplemental oxygen: Secondary | ICD-10-CM | POA: Diagnosis not present

## 2016-09-24 DIAGNOSIS — J432 Centrilobular emphysema: Secondary | ICD-10-CM | POA: Diagnosis not present

## 2016-09-24 HISTORY — DX: Low back pain, unspecified: M54.50

## 2016-09-24 HISTORY — DX: Malignant neoplasm of unspecified site of unspecified female breast: C50.919

## 2016-09-24 HISTORY — DX: Acute embolism and thrombosis of unspecified deep veins of unspecified lower extremity: I82.409

## 2016-09-24 HISTORY — DX: Low back pain: M54.5

## 2016-09-24 HISTORY — DX: Other chronic pain: G89.29

## 2016-09-24 HISTORY — DX: Unspecified osteoarthritis, unspecified site: M19.90

## 2016-09-24 LAB — CBC WITH DIFFERENTIAL/PLATELET
Basophils Absolute: 0 10*3/uL (ref 0.0–0.1)
Basophils Relative: 1 %
Eosinophils Absolute: 0.1 10*3/uL (ref 0.0–0.7)
Eosinophils Relative: 1 %
HCT: 40.8 % (ref 36.0–46.0)
Hemoglobin: 11.1 g/dL — ABNORMAL LOW (ref 12.0–15.0)
Lymphocytes Relative: 18 %
Lymphs Abs: 1.5 10*3/uL (ref 0.7–4.0)
MCH: 24.6 pg — ABNORMAL LOW (ref 26.0–34.0)
MCHC: 27.2 g/dL — ABNORMAL LOW (ref 30.0–36.0)
MCV: 90.3 fL (ref 78.0–100.0)
Monocytes Absolute: 0.7 10*3/uL (ref 0.1–1.0)
Monocytes Relative: 9 %
Neutro Abs: 5.8 10*3/uL (ref 1.7–7.7)
Neutrophils Relative %: 71 %
Platelets: 159 10*3/uL (ref 150–400)
RBC: 4.52 MIL/uL (ref 3.87–5.11)
RDW: 16.9 % — ABNORMAL HIGH (ref 11.5–15.5)
WBC: 8.1 10*3/uL (ref 4.0–10.5)

## 2016-09-24 LAB — PROTIME-INR
INR: 1.19
Prothrombin Time: 15.2 seconds (ref 11.4–15.2)

## 2016-09-24 LAB — LACTIC ACID, PLASMA: Lactic Acid, Venous: 1.1 mmol/L (ref 0.5–1.9)

## 2016-09-24 LAB — COMPREHENSIVE METABOLIC PANEL
ALT: 14 U/L (ref 14–54)
AST: 21 U/L (ref 15–41)
Albumin: 2.8 g/dL — ABNORMAL LOW (ref 3.5–5.0)
Alkaline Phosphatase: 80 U/L (ref 38–126)
Anion gap: 8 (ref 5–15)
BUN: 17 mg/dL (ref 6–20)
CO2: 33 mmol/L — ABNORMAL HIGH (ref 22–32)
Calcium: 8.1 mg/dL — ABNORMAL LOW (ref 8.9–10.3)
Chloride: 104 mmol/L (ref 101–111)
Creatinine, Ser: 0.68 mg/dL (ref 0.44–1.00)
GFR calc Af Amer: >60 mL/min (ref >60)
GFR calc non Af Amer: >60 mL/min (ref >60)
Glucose, Bld: 101 mg/dL — ABNORMAL HIGH (ref 65–99)
Potassium: 3.2 mmol/L — ABNORMAL LOW (ref 3.5–5.1)
Sodium: 145 mmol/L (ref 135–145)
Total Bilirubin: 0.7 mg/dL (ref 0.3–1.2)
Total Protein: 5.9 g/dL — ABNORMAL LOW (ref 6.5–8.1)

## 2016-09-24 LAB — BRAIN NATRIURETIC PEPTIDE: B Natriuretic Peptide: 384.6 pg/mL — ABNORMAL HIGH (ref 0.0–100.0)

## 2016-09-24 LAB — TSH: TSH: 1.381 u[IU]/mL (ref 0.350–4.500)

## 2016-09-24 LAB — D-DIMER, QUANTITATIVE (NOT AT ARMC): D-Dimer, Quant: 0.91 ug/mL-FEU — ABNORMAL HIGH (ref 0.00–0.50)

## 2016-09-24 LAB — TROPONIN I: Troponin I: 0.03 ng/mL (ref <0.03)

## 2016-09-24 MED ORDER — SODIUM CHLORIDE 0.9% FLUSH
3.0000 mL | Freq: Two times a day (BID) | INTRAVENOUS | Status: DC
  Administered 2016-09-24 – 2016-09-30 (×8): 3 mL via INTRAVENOUS

## 2016-09-24 MED ORDER — SODIUM CHLORIDE 0.9 % IV SOLN: 250.0000 mL | INTRAVENOUS | Status: DC | PRN

## 2016-09-24 MED ORDER — ALBUTEROL SULFATE (2.5 MG/3ML) 0.083% IN NEBU
2.5000 mg | INHALATION_SOLUTION | RESPIRATORY_TRACT | Status: DC | PRN
  Administered 2016-09-30 – 2016-10-04 (×2): 2.5 mg via RESPIRATORY_TRACT
  Filled 2016-09-24 (×2): qty 3

## 2016-09-24 MED ORDER — MOMETASONE FURO-FORMOTEROL FUM 100-5 MCG/ACT IN AERO
2.0000 | INHALATION_SPRAY | Freq: Two times a day (BID) | RESPIRATORY_TRACT | Status: DC
  Filled 2016-09-24: qty 8.8

## 2016-09-24 MED ORDER — SENNOSIDES-DOCUSATE SODIUM 8.6-50 MG PO TABS
1.0000 | ORAL_TABLET | Freq: Every evening | ORAL | Status: DC | PRN
  Administered 2016-09-24 – 2016-09-27 (×3): 1 via ORAL
  Filled 2016-09-24 (×3): qty 1

## 2016-09-24 MED ORDER — ACETAMINOPHEN 325 MG PO TABS: 650.0000 mg | ORAL_TABLET | Freq: Four times a day (QID) | ORAL | Status: DC | PRN

## 2016-09-24 MED ORDER — SODIUM CHLORIDE 0.9% FLUSH
3.0000 mL | Freq: Two times a day (BID) | INTRAVENOUS | Status: DC
  Administered 2016-09-26 – 2016-10-03 (×11): 3 mL via INTRAVENOUS

## 2016-09-24 MED ORDER — HEPARIN SODIUM (PORCINE) 5000 UNIT/ML IJ SOLN
5000.0000 [IU] | Freq: Three times a day (TID) | INTRAMUSCULAR | Status: DC
  Administered 2016-09-26 – 2016-10-04 (×24): 5000 [IU] via SUBCUTANEOUS
  Filled 2016-09-24 (×25): qty 1

## 2016-09-24 MED ORDER — SODIUM CHLORIDE 0.9% FLUSH: 3.0000 mL | INTRAVENOUS | Status: DC | PRN

## 2016-09-24 MED ORDER — INFLUENZA VAC SPLIT QUAD 0.5 ML IM SUSY
0.5000 mL | PREFILLED_SYRINGE | INTRAMUSCULAR | Status: DC
  Filled 2016-09-24: qty 0.5

## 2016-09-24 MED ORDER — ACETAMINOPHEN 650 MG RE SUPP: 650.0000 mg | Freq: Four times a day (QID) | RECTAL | Status: DC | PRN

## 2016-09-24 MED ORDER — MOMETASONE FURO-FORMOTEROL FUM 100-5 MCG/ACT IN AERO
2.0000 | INHALATION_SPRAY | Freq: Two times a day (BID) | RESPIRATORY_TRACT | Status: DC
  Administered 2016-09-25: 2 via RESPIRATORY_TRACT
  Filled 2016-09-24: qty 8.8

## 2016-09-24 MED ORDER — POTASSIUM CHLORIDE CRYS ER 20 MEQ PO TBCR
20.0000 meq | EXTENDED_RELEASE_TABLET | ORAL | Status: AC
  Administered 2016-09-24 – 2016-09-25 (×2): 20 meq via ORAL
  Filled 2016-09-24 (×2): qty 1

## 2016-09-24 MED ORDER — FUROSEMIDE 10 MG/ML IJ SOLN
20.0000 mg | Freq: Once | INTRAMUSCULAR | Status: AC
  Administered 2016-09-24: 20 mg via INTRAVENOUS
  Filled 2016-09-24: qty 2

## 2016-09-24 NOTE — Progress Notes (Signed)
CRITICAL VALUE ALERT  Critical value received:  Trop 0.03  Date of notification:  09/24/16  Time of notification:  2130  Critical value read back:yes  Nurse who received alert:  Arthor Captain LPN  MD notified (1st page):  E-Link Dr Waymon Budge  Time of first page:  2141  MD notified (2nd page):  Time of second page:  Responding MD:  E-Link Dr Waymon Budge  Time MD responded:  2148

## 2016-09-24 NOTE — Assessment & Plan Note (Signed)
Acute on Chronic Hypoxic Resp Failure ? Etiology In high risk patient (COPD/Pulmonary HTN , previous Vent support )  Will need hospitalization with workup D Dimer, BNP, CXR , if D Dimer is elevated will need CTA Chest if kidney fxn allows.   Plan  Admit to [facility] 5 W , via EMS  O2 to keep sats >90%.  See H/P order

## 2016-09-24 NOTE — Progress Notes (Signed)
[facility] Progress Note Patient Name: [name] DOB: 03-02-1951 MRN: [id number]   Date of Service  09/24/2016  HPI/Events of Note  Notified by bedside nurse that patient's troponin I is elevated at 0.03. Serum BNP also elevated at 384.6. Patient does have prior BNP levels that have been higher however. Nurse reports patient has stable respiratory status but continues to require 3 L/m of oxygen. Serum creatinine 0.68 with no history of contrast dye allergy. Also note patient has serum potassium 3.2. White blood cell count 8.1. Admission H&P reviewed. Chest x-ray PA/LAT reviewed without new focal opacity as far as I can tell and no obvious pleural effusion. Doubtful patient is having acute exacerbation of congestive heart failure.   eICU Interventions  1. Ordering CT angiogram of the chest stat as per admission H&P plan 2. Checking complete echocardiogram with contrast 3. Continuing to trend troponin I 4. Holding on further diuresis 5. KCl 20 mEq by mouth 2 doses 6. Repeat electrolytes and magnesium in the morning 7. Continuing supplemental oxygen      Intervention Category Major Interventions: Respiratory failure - evaluation and management  Tera Partridge 09/24/2016, 9:49 PM

## 2016-09-24 NOTE — Progress Notes (Signed)
@Patient  ID: [name], female    DOB: 1951/04/03, 66 y.o.   MRN: [id number]  Chief Complaint  Patient presents with  . Acute Visit    SOB     Referring provider: Janora Norlander, DO  HPI: 66 yo female followed for Moderate COPD   TEST  Echo 07/2015 EF 55-60%, gr 1 DD . PAP 65 , LA mod dila./Mild RA dilation   09/24/2016 Acute OV : Dyspnea  Pt presents for an acute office visit for 1 week for dyspnea with exertion and notices O2 sats in low 80s.  Says she has not had much cough . No wheezing . Denies fever or chest pain  Does have some chest tightness. Ankles are more swollen than usual . She denies orthopnea.  On arrival to office O2 sats 69-70% on room air. She denies discolored mucus.  She was placed on Oxygen and required 4l/m to keep sats >90%.     No Known Allergies  Immunization History  Administered Date(s) Administered  . Pneumococcal Polysaccharide-23 12/25/2013    Past Medical History:  Diagnosis Date  . Abnormal weight gain 08/14/2009   Qualifier: Diagnosis of  By: Lindell Noe MD, Jeneen Rinks    . Acute respiratory failure ([place])   . Allergic rhinitis   . Anemia   . Arthritis    osteo  . Cancer [facility])    Breast CA left. s/p radiation and lumpectomy.  . CAP (community acquired pneumonia)   . COPD (chronic obstructive pulmonary disease) ([place])   . Edema extremities    lower  . Fatigue   . Hypertension   . Hypoalbuminemia   . Hypocalcemia   . Macular degeneration   . Pneumonia   . Psoriasis   . Tobacco abuse     Tobacco History: History  Smoking Status  . Former Smoker  . Packs/day: 0.20  . Years: 40.00  . Types: Cigarettes  . Quit date: 12/15/2013  Smokeless Tobacco  . Never Used   Counseling given: Not Answered   Outpatient Encounter Prescriptions as of 09/24/2016  Medication Sig  . hydrocortisone cream 0.5 % Apply 1 application topically 2 (two) times daily.  . mometasone-formoterol (DULERA) 100-5 MCG/ACT AERO Inhale 2 puffs into the lungs  2 (two) times daily. (Patient taking differently: Inhale 2 puffs into the lungs 2 (two) times daily as needed. )  . Multiple Vitamin (MULTIVITAMIN WITH MINERALS) TABS tablet Take 1 tablet by mouth daily.   No facility-administered encounter medications on file as of 09/24/2016.      Review of Systems  Constitutional:   No  weight loss, night sweats,  Fevers, chills,  +fatigue, or  lassitude.  HEENT:   No headaches,  Difficulty swallowing,  Tooth/dental problems, or  Sore throat,                No sneezing, itching, ear ache, nasal congestion, post nasal drip,   CV:  No chest pain,  Orthopnea, PND,  anasarca, dizziness, palpitations, syncope.   GI  No heartburn, indigestion, abdominal pain, nausea, vomiting, diarrhea, change in bowel habits, loss of appetite, bloody stools.   Resp:  No chest wall deformity  Skin: no rash or lesions.  GU: no dysuria, change in color of urine, no urgency or frequency.  No flank pain, no hematuria   MS:  No joint pain or swelling.  No decreased range of motion.  No back pain.    Physical Exam  Ht 5\' 5"  (1.651 m)   Wt 253  lb 12.8 oz (115.1 kg)   BMI 42.23 kg/m   GEN: A/Ox3; pleasant , NAD, elderly    HEENT:  [place]/AT,  EACs-clear, TMs-wnl, NOSE-clear, THROAT-clear, no lesions, no postnasal drip or exudate noted.   NECK:  Supple w/ fair ROM; no JVD; normal carotid impulses w/o bruits; no thyromegaly or nodules palpated; no lymphadenopathy.    RESP  Decreased BS in bases ,  no accessory muscle use, no dullness to percussion  CARD:  RRR, no m/r/g, 1+ peripheral edema, pulses intact, no cyanosis or clubbing.  GI:   Soft & nt; nml bowel sounds; no organomegaly or masses detected.   Musco: Warm bil, no deformities or joint swelling noted.   Neuro: alert, no focal deficits noted.    Skin: Warm, no lesions or rashes  Psych:  No change in mood or affect. No depression or anxiety.  No memory loss.     Rexene Edison, NP 09/24/2016

## 2016-09-24 NOTE — H&P (Signed)
Name: [name] MRN: [id number] DOB: September 27, 1950    ADMISSION DATE:  09/24/16   CHIEF COMPLAINT:  Sob   BRIEF PATIENT DESCRIPTION:  66 yo female quit smoking 2 y PTA followed for morbid obesity  GOLD II COPD (FEV1 54%) with hx of hypercarbic/hypoxic RF previously on home O2 last in early 2017 . Previous severe exacerbation w/ vent support in 2015  Admit from office with progressive doe x one week with desats into the 70's at rest on RA  SIGNIFICANT EVENTS  Admitted from office 1/12 to [facility] tele    STUDIES:  12/2013 PFT> Ratio 46%, FEV1 1.24L (54% pred, 44% change), TLC 6.17L (127% pred), DLCO 10.82 (48% pred),  CTA Chest 07/2015 >Trace right-sided pleural effusion, and minimal interlobular septal thickening. Diffuse emphysema 07/2015 Echo >EF 55-60%, LA mod dilated , PAP 65  D Dimer 1/12/>>   HISTORY OF PRESENT ILLNESS:   66 yo female  Former smoker with GOLD II COPD on Dulera at home presented to clinic with 1 week of indolent onset  progressive DOE and low O2 sats but still comfortable at rest. . Says over last week has been more winded with walking around , intermittent chest tightness and with LE edema bilateral/ equal/mild. O2 sats at home in low 80s on room air . Was on O2 2l/m early 2017 but was d/c w/ no resolution on desaturations . On arrival to office O2 sat 69-70% on RA . Required 4l/m to get sat >90%. She denies significant cough , congestioin, or wheezing . No fever or discolored mcusu . No boy aches . She will be admitted for further evaluation and treatment . Transferred to [facility] 5 W via EMS.   PAST MEDICAL HISTORY :   has a past medical history of Abnormal weight gain (08/14/2009); Acute respiratory failure ([place]); Allergic rhinitis; Anemia; Arthritis; Cancer ([place]); CAP (community acquired pneumonia); COPD (chronic obstructive pulmonary disease) ([place]); Edema extremities; Fatigue; Hypertension; Hypoalbuminemia; Hypocalcemia; Macular degeneration; Pneumonia; Psoriasis; and  Tobacco abuse.  has a past surgical history that includes left knee replacement (1995); Gastric bypass (May 2004); and Breast surgery. Prior to Admission medications   Medication Sig Start Date End Date Taking? Authorizing Provider  hydrocortisone cream 0.5 % Apply 1 application topically 2 (two) times daily. 12/29/15   Ashly Windell Moulding, DO  mometasone-formoterol (DULERA) 100-5 MCG/ACT AERO Inhale 2 puffs into the lungs 2 (two) times daily. Patient taking differently: Inhale 2 puffs into the lungs 2 (two) times daily as needed.  09/10/15   Rosemarie Ax, MD  Multiple Vitamin (MULTIVITAMIN WITH MINERALS) TABS tablet Take 1 tablet by mouth daily.    Historical Provider, MD   No Known Allergies  FAMILY HISTORY:  family history includes Cancer (age of onset: 32) in her father; Diabetes in her daughter; Heart disease in her father; Hypertension in her mother; Osteoarthritis in her mother; Thyroid disease in her mother. SOCIAL HISTORY:  reports that she quit smoking about 2 years ago. Her smoking use included Cigarettes. She has a 8.00 pack-year smoking history. She has never used smokeless tobacco. She reports that she does not drink alcohol or use drugs.  REVIEW OF SYSTEMS:   Constitutional:   No  weight loss, night sweats,  Fevers, chills, +fatigue, or  lassitude.  HEENT:   No headaches,  Difficulty swallowing,  Tooth/dental problems, or  Sore throat,                No sneezing, itching, ear ache, nasal congestion,  post nasal drip,   CV:  No chest pain,  Orthopnea, PND, swelling in lower extremities, anasarca, dizziness, palpitations, syncope.   GI  No heartburn, indigestion, abdominal pain, nausea, vomiting, diarrhea, change in bowel habits, loss of appetite, bloody stools.   Resp:    No chest wall deformity  Skin: no rash or lesions.  GU: no dysuria, change in color of urine, no urgency or frequency.  No flank pain, no hematuria   MS:  No joint pain or swelling.  No decreased range  of motion.  No back pain.  Psych:  No change in mood or affect. No depression or anxiety.  No memory loss.      SUBJECTIVE:  Nad at rest on 4lpm sitting upright in Chair   Wt Readings from Last 3 Encounters:  09/24/16 253 lb 12.8 oz (115.1 kg)  12/29/15 241 lb 9.6 oz (109.6 kg)  09/10/15 232 lb 4.8 oz (105.4 kg)    Vital signs reviewed   VITAL SIGNS: Pulse Rate:  [63] 63 (01/12 1644) BP: (138)/(60) 138/60 (01/12 1644) SpO2:  [69 %] 69 % (01/12 1644) Weight:  [253 lb 12.8 oz (115.1 kg)] 253 lb 12.8 oz (115.1 kg) (01/12 1644)  PHYSICAL EXAMINATION: GEN: A/Ox3; talkative elderly female chronically ill appearing    HEENT:  [place]/AT,  EACs-clear, TMs-wnl, NOSE-clear, THROAT-clear, no lesions, no postnasal drip or exudate noted.   NECK:  Supple w/ fair ROM; no JVD; normal carotid impulses w/o bruits; no thyromegaly or nodules palpated; no lymphadenopathy.    RESP  Decreased BS in bases  no accessory muscle use, no dullness to percussion  CARD:  RRR, no m/r/g  , 1+ peripheral edema both lower ext =  And  sym, pulses intact, no cyanosis or clubbing.  GI:   Soft & nt; nml bowel sounds; no organomegaly or masses detected.   Musco: Warm bil, no deformities or joint swelling noted.   Neuro: alert, no focal deficits noted.    Skin: Warm, no lesions or rashes   No results for input(s): NA, K, CL, CO2, BUN, CREATININE, GLUCOSE in the last 168 hours. No results for input(s): HGB, HCT, WBC, PLT in the last 168 hours. No results found.  ASSESSMENT / PLAN:  1. Acute on Chronic Hypoxic Respiratory Failure (not on home O2 since early 2017 ) ? Etiology ? Fluid overload contributing vs hypoxemia contributing to high PA pressures with acute cor pulmonale and secondary fluid overload   Plan  Check labs w/ D . Dimer (if positive , CTA Chest if scr allows)  Check EKG /Cardiac enzymes/BNP /lactate  Check CXR  O2 to keep sat >90%.   2. COPD GOLD II - no obvious AB component - 12/2013 PFT>  Ratio 46%, FEV1  1.24L (54% pred, 44% change), TLC 6.17L (127% pred), DLCO 10.82 (48% pred)  Plan  Hold on steroids and Abx for now  If cxr shows PNA , add Abx.  Trend Temp/WBC   3. Diastolic CHF w/ Pulmonary HTN ? Decompensated w/ fluid overload.    Plan  Lasix 20mg  IV x 1  Check BNP , if elevated may need repeat Echo .  Hep DVT prophylaxis   4) Morbid obesity > check TSH / needs nutrition counseling   Tammy Parrett NP-C  Pulmonary and [place] Pager: [phone]  09/24/2016, 5:24 PM    Pt was independently seen and examined and chart reviewed - note progressive wt gain in pt already morbidly obese and  desats disproportionate to sense of sob are typical of OHS and not copd flare or acute PE so she probably has chronic hypoxemia/ cor pulmonale and best rx is to rule out PE to the extent we can and titrate 02 to keep sats in low 90's and see if gentle diuresis helps here.   See admit orders/ check results when available

## 2016-09-25 ENCOUNTER — Inpatient Hospital Stay (HOSPITAL_COMMUNITY): Payer: Medicare Other

## 2016-09-25 DIAGNOSIS — J9602 Acute respiratory failure with hypercapnia: Secondary | ICD-10-CM

## 2016-09-25 DIAGNOSIS — J438 Other emphysema: Secondary | ICD-10-CM

## 2016-09-25 DIAGNOSIS — E876 Hypokalemia: Secondary | ICD-10-CM

## 2016-09-25 DIAGNOSIS — R0600 Dyspnea, unspecified: Secondary | ICD-10-CM

## 2016-09-25 DIAGNOSIS — J9601 Acute respiratory failure with hypoxia: Secondary | ICD-10-CM

## 2016-09-25 LAB — RENAL FUNCTION PANEL
Albumin: 2.7 g/dL — ABNORMAL LOW (ref 3.5–5.0)
Anion gap: 11 (ref 5–15)
BUN: 17 mg/dL (ref 6–20)
CO2: 33 mmol/L — ABNORMAL HIGH (ref 22–32)
Calcium: 7.8 mg/dL — ABNORMAL LOW (ref 8.9–10.3)
Chloride: 102 mmol/L (ref 101–111)
Creatinine, Ser: 0.74 mg/dL (ref 0.44–1.00)
GFR calc Af Amer: >60 mL/min (ref >60)
GFR calc non Af Amer: >60 mL/min (ref >60)
Glucose, Bld: 99 mg/dL (ref 65–99)
Phosphorus: 4.5 mg/dL (ref 2.5–4.6)
Potassium: 3.5 mmol/L (ref 3.5–5.1)
Sodium: 146 mmol/L — ABNORMAL HIGH (ref 135–145)

## 2016-09-25 LAB — CBC WITH DIFFERENTIAL/PLATELET
Basophils Absolute: 0 10*3/uL (ref 0.0–0.1)
Basophils Relative: 1 %
Eosinophils Absolute: 0.2 10*3/uL (ref 0.0–0.7)
Eosinophils Relative: 3 %
HCT: 39.8 % (ref 36.0–46.0)
Hemoglobin: 10.5 g/dL — ABNORMAL LOW (ref 12.0–15.0)
Lymphocytes Relative: 21 %
Lymphs Abs: 1.4 10*3/uL (ref 0.7–4.0)
MCH: 24.6 pg — ABNORMAL LOW (ref 26.0–34.0)
MCHC: 26.4 g/dL — ABNORMAL LOW (ref 30.0–36.0)
MCV: 93.4 fL (ref 78.0–100.0)
Monocytes Absolute: 0.6 10*3/uL (ref 0.1–1.0)
Monocytes Relative: 9 %
Neutro Abs: 4.3 10*3/uL (ref 1.7–7.7)
Neutrophils Relative %: 66 %
Platelets: 180 10*3/uL (ref 150–400)
RBC: 4.26 MIL/uL (ref 3.87–5.11)
RDW: 17.3 % — ABNORMAL HIGH (ref 11.5–15.5)
WBC: 6.5 10*3/uL (ref 4.0–10.5)

## 2016-09-25 LAB — TROPONIN I
Troponin I: <0.03 ng/mL (ref <0.03)
Troponin I: <0.03 ng/mL (ref <0.03)

## 2016-09-25 LAB — ECHOCARDIOGRAM COMPLETE
Height: 65 in
Weight: 4060.8 oz

## 2016-09-25 LAB — MAGNESIUM: Magnesium: 2 mg/dL (ref 1.7–2.4)

## 2016-09-25 MED ORDER — POTASSIUM CHLORIDE 20 MEQ PO PACK
20.0000 meq | PACK | Freq: Two times a day (BID) | ORAL | Status: DC
  Administered 2016-09-25 (×2): 20 meq via ORAL
  Filled 2016-09-25 (×4): qty 1

## 2016-09-25 MED ORDER — CALCIUM CARBONATE ANTACID 500 MG PO CHEW
1.0000 | CHEWABLE_TABLET | Freq: Two times a day (BID) | ORAL | Status: DC
  Administered 2016-09-25 (×2): 200 mg via ORAL
  Filled 2016-09-25 (×2): qty 1

## 2016-09-25 MED ORDER — IOPAMIDOL (ISOVUE-370) INJECTION 76%
INTRAVENOUS | Status: AC
  Administered 2016-09-25: 80 mL
  Filled 2016-09-25: qty 100

## 2016-09-25 MED ORDER — ORAL CARE MOUTH RINSE
15.0000 mL | Freq: Two times a day (BID) | OROMUCOSAL | Status: DC
  Administered 2016-09-25 – 2016-09-27 (×4): 15 mL via OROMUCOSAL

## 2016-09-25 NOTE — Progress Notes (Signed)
Echocardiogram 2D Echocardiogram has been performed.  [name] 09/25/2016, 2:50 PM

## 2016-09-25 NOTE — H&P (Signed)
Name: [name] MRN: [id number] DOB: 1951/06/24    ADMISSION DATE:  09/24/16   CHIEF COMPLAINT:  Sob   BRIEF PATIENT DESCRIPTION:  66 yo female quit smoking 2 y PTA followed for morbid obesity  GOLD II COPD (FEV1 54%) with hx of hypercarbic/hypoxic RF previously on home O2 last in early 2017 . Previous severe exacerbation w/ vent support in 2015  Admit from office with progressive doe x one week with desats into the 70's at rest on RA  SIGNIFICANT EVENTS  Admitted from office 1/12 to [facility] tele    STUDIES:  12/2013 PFT> Ratio 46%, FEV1 1.24L (54% pred, 44% change), TLC 6.17L (127% pred), DLCO 10.82 (48% pred),  CTA Chest 07/2015 >Trace right-sided pleural effusion, and minimal interlobular septal thickening. Diffuse emphysema 07/2015 Echo >EF 55-60%, LA mod dilated , PAP 65  D Dimer 1/12/>>   HISTORY OF PRESENT ILLNESS:   66 yo female  Former smoker with GOLD II COPD on Dulera at home presented to clinic with 1 week of indolent onset  progressive DOE and low O2 sats but still comfortable at rest. . Says over last week has been more winded with walking around , intermittent chest tightness and with LE edema bilateral/ equal/mild. O2 sats at home in low 80s on room air . Was on O2 2l/m early 2017 but was d/c w/ no resolution on desaturations . On arrival to office O2 sat 69-70% on RA . Required 4l/m to get sat >90%. She denies significant cough , congestioin, or wheezing . No fever or discolored mcusu . No boy aches . She will be admitted for further evaluation and treatment . Transferred to [facility] 5 W via EMS.   SUBJECTIVE:  Woke easily. Says chest tight/ dyspnea improved. Denies chest pain, palpitation, leg discomfort.   Wt Readings from Last 3 Encounters:  09/24/16 115.1 kg (253 lb 12.8 oz)  12/29/15 109.6 kg (241 lb 9.6 oz)  09/10/15 105.4 kg (232 lb 4.8 oz)    Vital signs reviewed   VITAL SIGNS: Temp:  [98.2 F (36.8 C)-99.5 F (37.5 C)] 98.2 F (36.8 C) (01/13  0609) Pulse Rate:  [63-88] 81 (01/13 0609) Resp:  [22] 22 (01/13 0609) BP: (114-159)/(46-80) 114/46 (01/13 0609) SpO2:  [69 %-100 %] 93 % (01/13 0609) Weight:  [115.1 kg (253 lb 12.8 oz)] 115.1 kg (253 lb 12.8 oz) (01/12 1644)  PHYSICAL EXAMINATION: GEN: A/Ox3; casual affect, morbidly obese   HEENT:  No stridor, voice normal, mucosa clear  NECK:  Supple w/ fair ROM; no JVD; normal carotid impulses w/o bruits; no thyromegaly or nodules palpated; no lymphadenopathy.    RESP  Decreased BS in bases  no accessory muscle use, no rales or wheeze  CARD:  RRR, no m/r/g  , 1+ peripheral edema both lower ext =  And  sym, Neg Homan's  GI:   Soft & nt; nml bowel sounds; no organomegaly or masses detected.   Musco: Warm bil, no deformities or joint swelling noted.   Neuro: alert, no focal deficits noted.    Skin: Warm, no lesions or rashes    Recent Labs Lab 09/24/16 2001 09/25/16 0227  NA 145 146*  K 3.2* 3.5  CL 104 102  CO2 33* 33*  BUN 17 17  CREATININE 0.68 0.74  GLUCOSE 101* 99    Recent Labs Lab 09/24/16 2001 09/25/16 0548  HGB 11.1* 10.5*  HCT 40.8 39.8  WBC 8.1 6.5  PLT 159 180   X-ray Chest  Pa And Lateral  Result Date: 09/24/2016 CLINICAL DATA:  Increased shortness of breath and hypoxia EXAM: CHEST  2 VIEW COMPARISON:  07/20/2015 FINDINGS: Tiny CP angle blunting on the lateral image consistent with tiny effusion. No focal consolidation. Elevated left diaphragm. Cardiomegaly with central vascular congestion. Mild diffuse interstitial prominence may reflect mild edema. Atherosclerosis. No pneumothorax. Surgical clips left axilla. IMPRESSION: 1. Tiny CP angle effusion 2. Cardiomegaly with central vascular congestion and mild diffuse interstitial opacities suggestive of mild edema Electronically Signed   By: Donavan Foil M.D.   On: 09/24/2016 22:20    ASSESSMENT / PLAN:  1. Acute on Chronic Hypoxic Respiratory Failure (not on home O2 since early 2017 ) ? Etiology ?  Fluid overload contributing vs hypoxemia contributing to high PA pressures with acute cor pulmonale and secondary fluid overload  Main concern R/O PE. CTa delayed overnight due to trauma patients per report- still pending. D-dimer 0.91 Cardiac enzymes- no injury Plan - proceed w CTa chest O2 to keep sat >90%.   2. COPD GOLD II - no obvious AB component - 12/2013 PFT> Ratio 46%, FEV1  1.24L (54% pred, 44% change), TLC 6.17L (127% pred), DLCO 10.82 (48% pred)  Plan  Hold on steroids and Abx for now  If cxr shows PNA , add Abx.  Trend Temp/WBC   3. Diastolic CHF w/ Pulmonary HTN ? Decompensated w/ fluid overload.   BNP 384.6. CXR c/w mild pulmonary edema. Higher 1 year ago. Plan  Lasix 20mg  IV x 1, now waiting for CTa dye load may need repeat Echo .  Hep DVT prophylaxis   4) Morbid obesity > needs nutrition counseling  TSH WNL  5) Anemia, Fe def. Hgb 10.5 Plan- Stool OB  6) Hypocalcemia  8.1 Plan- Tums  7) Hypokalemia 3.2 Plan- Winfred Leeds D  Pulmonary and [place] p [phone] After 3:00 PMPager: [phone]  09/25/2016, 8:31 AM    Pt was independently seen and examined and chart reviewed - note progressive wt gain in pt already morbidly obese and desats disproportionate to sense of sob are typical of OHS and not copd flare or acute PE so she probably has chronic hypoxemia/ cor pulmonale and best rx is to rule out PE to the extent we can and titrate 02 to keep sats in low 90's and see if gentle diuresis helps here.

## 2016-09-25 NOTE — Progress Notes (Signed)
eLink Physician-Brief Progress Note Patient Name: [name] DOB: Nov 12, 1950 MRN: [id number]   Date of Service  09/25/2016  HPI/Events of Note  CT angiogram of the chest for PE was not performed overnight. Discussed with bedside nurse who reports she was told that the scan was backed up due to traumas. Patient had no acute events overnight. No significant change in oxygen requirement. Remains mildly tachypneic but not tachycardic and normal to hypertensive.   eICU Interventions  1. Holding off on empiric systemic anticoagulation at this time 2. Awaiting CT angiogram of the chest      Intervention Category Major Interventions: Respiratory failure - evaluation and management  Tera Partridge 09/25/2016, 7:02 AM

## 2016-09-26 ENCOUNTER — Inpatient Hospital Stay (HOSPITAL_COMMUNITY): Payer: Medicare Other

## 2016-09-26 DIAGNOSIS — J9621 Acute and chronic respiratory failure with hypoxia: Principal | ICD-10-CM

## 2016-09-26 DIAGNOSIS — R410 Disorientation, unspecified: Secondary | ICD-10-CM

## 2016-09-26 LAB — BLOOD GAS, ARTERIAL
Acid-Base Excess: 14.3 mmol/L — ABNORMAL HIGH (ref 0.0–2.0)
Bicarbonate: 42.8 mmol/L — ABNORMAL HIGH (ref 20.0–28.0)
Drawn by: 25203
O2 Content: 4 L/min
O2 Saturation: 85.2 %
Patient temperature: 98.6
pCO2 arterial: 117 mmHg (ref 32.0–48.0)
pH, Arterial: 7.187 — CL (ref 7.350–7.450)
pO2, Arterial: 54.7 mmHg — ABNORMAL LOW (ref 83.0–108.0)

## 2016-09-26 LAB — POCT I-STAT 3, ART BLOOD GAS (G3+)
Acid-Base Excess: 11 mmol/L — ABNORMAL HIGH (ref 0.0–2.0)
Bicarbonate: 41.5 mmol/L — ABNORMAL HIGH (ref 20.0–28.0)
O2 Saturation: 83 %
Patient temperature: 98.5
TCO2: 44 mmol/L (ref 0–100)
pCO2 arterial: 93.7 mmHg (ref 32.0–48.0)
pH, Arterial: 7.254 — ABNORMAL LOW (ref 7.350–7.450)
pO2, Arterial: 59 mmHg — ABNORMAL LOW (ref 83.0–108.0)

## 2016-09-26 LAB — BASIC METABOLIC PANEL
Anion gap: 8 (ref 5–15)
BUN: 16 mg/dL (ref 6–20)
CO2: 36 mmol/L — ABNORMAL HIGH (ref 22–32)
Calcium: 7.8 mg/dL — ABNORMAL LOW (ref 8.9–10.3)
Chloride: 102 mmol/L (ref 101–111)
Creatinine, Ser: 0.65 mg/dL (ref 0.44–1.00)
GFR calc Af Amer: >60 mL/min (ref >60)
GFR calc non Af Amer: >60 mL/min (ref >60)
Glucose, Bld: 96 mg/dL (ref 65–99)
Potassium: 4.7 mmol/L (ref 3.5–5.1)
Sodium: 146 mmol/L — ABNORMAL HIGH (ref 135–145)

## 2016-09-26 LAB — TRIGLYCERIDES: Triglycerides: 57 mg/dL (ref <150)

## 2016-09-26 LAB — MRSA PCR SCREENING: MRSA by PCR: NEGATIVE

## 2016-09-26 MED ORDER — CHLORHEXIDINE GLUCONATE 0.12% ORAL RINSE (MEDLINE KIT)
15.0000 mL | Freq: Two times a day (BID) | OROMUCOSAL | Status: DC
  Administered 2016-09-26 – 2016-09-28 (×5): 15 mL via OROMUCOSAL

## 2016-09-26 MED ORDER — ONDANSETRON HCL 4 MG/2ML IJ SOLN
INTRAMUSCULAR | Status: AC
  Filled 2016-09-26: qty 2

## 2016-09-26 MED ORDER — PROPOFOL 1000 MG/100ML IV EMUL
0.0000 ug/kg/min | INTRAVENOUS | Status: DC
  Administered 2016-09-26 – 2016-09-28 (×11): 30 ug/kg/min via INTRAVENOUS
  Administered 2016-09-28 (×2): 40 ug/kg/min via INTRAVENOUS
  Filled 2016-09-26 (×12): qty 100

## 2016-09-26 MED ORDER — FUROSEMIDE 10 MG/ML IJ SOLN
40.0000 mg | Freq: Once | INTRAMUSCULAR | Status: AC
  Administered 2016-09-26: 40 mg via INTRAVENOUS
  Filled 2016-09-26: qty 4

## 2016-09-26 MED ORDER — PANTOPRAZOLE SODIUM 40 MG IV SOLR
40.0000 mg | Freq: Every day | INTRAVENOUS | Status: DC
  Administered 2016-09-26 – 2016-09-28 (×3): 40 mg via INTRAVENOUS
  Filled 2016-09-26 (×3): qty 40

## 2016-09-26 MED ORDER — ROCURONIUM BROMIDE 50 MG/5ML IV SOLN
1.0000 mg/kg | Freq: Once | INTRAVENOUS | Status: DC
Start: 2016-09-26 — End: 2016-10-02

## 2016-09-26 MED ORDER — FENTANYL CITRATE (PF) 100 MCG/2ML IJ SOLN
50.0000 ug | INTRAMUSCULAR | Status: DC | PRN
  Administered 2016-09-27: 50 ug via INTRAVENOUS

## 2016-09-26 MED ORDER — SODIUM CHLORIDE 0.9 % IV SOLN: INTRAVENOUS | Status: DC

## 2016-09-26 MED ORDER — BUDESONIDE 0.25 MG/2ML IN SUSP
0.2500 mg | Freq: Two times a day (BID) | RESPIRATORY_TRACT | Status: DC
  Administered 2016-09-26 – 2016-09-30 (×9): 0.25 mg via RESPIRATORY_TRACT
  Filled 2016-09-26 (×9): qty 2

## 2016-09-26 MED ORDER — ONDANSETRON HCL 4 MG/2ML IJ SOLN
4.0000 mg | Freq: Once | INTRAMUSCULAR | Status: AC
  Administered 2016-09-26: 4 mg via INTRAVENOUS

## 2016-09-26 MED ORDER — FENTANYL CITRATE (PF) 100 MCG/2ML IJ SOLN
INTRAMUSCULAR | Status: AC
  Administered 2016-09-26: 100 ug
  Filled 2016-09-26: qty 2

## 2016-09-26 MED ORDER — FENTANYL CITRATE (PF) 100 MCG/2ML IJ SOLN
50.0000 ug | INTRAMUSCULAR | Status: DC | PRN
  Filled 2016-09-26: qty 2

## 2016-09-26 MED ORDER — ORAL CARE MOUTH RINSE
15.0000 mL | Freq: Four times a day (QID) | OROMUCOSAL | Status: DC
  Administered 2016-09-26 – 2016-09-28 (×9): 15 mL via OROMUCOSAL

## 2016-09-26 MED ORDER — MIDAZOLAM HCL 2 MG/2ML IJ SOLN
INTRAMUSCULAR | Status: AC
  Administered 2016-09-26: 2 mg
  Filled 2016-09-26: qty 2

## 2016-09-26 MED ORDER — IPRATROPIUM-ALBUTEROL 0.5-2.5 (3) MG/3ML IN SOLN
3.0000 mL | Freq: Four times a day (QID) | RESPIRATORY_TRACT | Status: DC
  Administered 2016-09-26 – 2016-10-03 (×26): 3 mL via RESPIRATORY_TRACT
  Filled 2016-09-26 (×27): qty 3

## 2016-09-26 NOTE — Procedures (Signed)
Intubation Procedure Note [name] [id number] 06/22/1951  Procedure: Intubation Indications: Respiratory insufficiency  Procedure Details Consent: Risks of procedure as well as the alternatives and risks of each were explained to the (patient/caregiver).  Consent for procedure obtained. Time Out: Verified patient identification, verified procedure, site/side was marked, verified correct patient position, special equipment/implants available, medications/allergies/relevent history reviewed, required imaging and test results available.  Performed  Drugs Etomidate 61m, Versed 247mDL x 1 with MAC 3 blade Grade 1 view 7.5 ET tube passed through cords under direct visualization Placement confirmed with bilateral breath sounds, positive EtCO2 change and smoke in tube   Evaluation Hemodynamic Status: BP stable throughout; O2 sats: stable throughout Patient's Current Condition: stable Complications: No apparent complications Patient did tolerate procedure well. Chest X-ray ordered to verify placement.  CXR: pending.   [name]/14/2018

## 2016-09-26 NOTE — Progress Notes (Signed)
56 Notified by [employer], Patient unresponsive. Notified PCCM and Rapid Response. PCCM called a code stroke, patient transported to CT. Patient received a CT of head, PT vomited while in CT. Dr. Cheral Marker assessed patient in CT. Pt gradually became alert and oriented x2 Name and Month, Answering yes and no to questions and moving extremities. Code stroke cancelled. Pt transferred to 2S14  Patient Daughters notified.

## 2016-09-26 NOTE — Progress Notes (Signed)
eLink Physician-Brief Progress Note Patient Name: [name] DOB: [birth year] MRN: [id number]   Date of Service  09/26/2016  HPI/Events of Note  Notified by neurology the patient had emesis in CT scanner.   eICU Interventions  1. Checking stat ABG 2. Stat Portable CXR     Intervention Category Major Interventions: Other:  Tera Partridge 09/26/2016, 6:36 AM

## 2016-09-26 NOTE — Consult Note (Signed)
Referring Physician: Dr. Creig Hines    Chief Complaint: AMS with unilateral weakness  HPI: [name] is an 66 y.o. female who was initially admitted to the ICU for acute on chronic hypoxic respiratory failure. She had acute onset of altered mental status this morning, followed by appropriate responses, but nursing noted new onset of unilateral weakness. Reportedly has not received sedating medication. Code Stroke was called. In CT she vomited. Was able to move all extremities equally.   Past Medical History:  Diagnosis Date  . Abnormal weight gain 08/14/2009   Qualifier: Diagnosis of  By: Lindell Noe MD, Jeneen Rinks    . Acute respiratory failure ([place])   . Allergic rhinitis   . Anemia   . Breast cancer [facility])    s/p radiation and lumpectomy.  . CAP (community acquired pneumonia) 12/2013  . Chronic lower back pain   . COPD (chronic obstructive pulmonary disease) ([place])   . DVT (deep venous thrombosis) ([place]) ~ 2004   LLE  . Edema extremities    lower  . Fatigue   . Hypertension    "only when I get stressed" (09/24/2016)  . Hypoalbuminemia   . Hypocalcemia   . Macular degeneration   . Osteoarthritis   . Psoriasis   . Tobacco abuse     Past Surgical History:  Procedure Laterality Date  . APPENDECTOMY    . BREAST LUMPECTOMY Left 1997  . JOINT REPLACEMENT    . KNEE ARTHROSCOPY Left    X2 ("before the replacement)  . ROUX-EN-Y GASTRIC BYPASS  May 2004  . TONSILLECTOMY    . TOTAL KNEE ARTHROPLASTY Left 1995  . TUBAL LIGATION      Family History  Problem Relation Age of Onset  . Osteoarthritis Mother   . Hypertension Mother   . Thyroid disease Mother   . Heart disease Father   . Cancer Father 51    prostate ca  . Diabetes Daughter    Social History:  reports that she quit smoking about 2 years ago. Her smoking use included Cigarettes. She has a 60.00 pack-year smoking history. She has never used smokeless tobacco. She reports that she drinks alcohol. She reports that she does not  use drugs.  Allergies: No Known Allergies  Medications:  Scheduled: . calcium carbonate  1 tablet Oral BID  . heparin  5,000 Units Subcutaneous Q8H  . Influenza vac split quadrivalent PF  0.5 mL Intramuscular Tomorrow-1000  . mouth rinse  15 mL Mouth Rinse q12n4p  . mometasone-formoterol  2 puff Inhalation BID  . potassium chloride  20 mEq Oral BID  . sodium chloride flush  3 mL Intravenous Q12H  . sodium chloride flush  3 mL Intravenous Q12H    ROS: Patient is nauseated and has vomited once in CT.   Physical Examination: Blood pressure (!) 141/60, pulse 91, temperature 99 F (37.2 C), temperature source Oral, resp. rate 18, SpO2 97 %.  General exam: Anxious appearing. Morbidly obese. Noncyanotic, nondiaphoretic.   Neurologic Examination: Ment: Awake and confused. Not responding well to voice initially, then exclaims and engages examiner after noxious stimulus is applied. Able to answer simple questions. No neglect. Poor eye contact. Agitated and anxious.  CN: PERRL. EOMI. No nystagmus. Attends to visual stimuli on right and left. Face symmetric. Phonation intact. XI: symmetric.  Motor/Sensory: Moves all 4 extremities with 4-5/5 strength in response to noxious stimuli. No asymmetry noted. .  Cerebellar: No gross ataxia noted.   Results for orders placed or performed during the hospital  encounter of 09/24/16 (from the past 48 hour(s))  Lactic acid, plasma     Status: None   Collection Time: 09/24/16  7:32 PM  Result Value Ref Range   Lactic Acid, Venous 1.1 0.5 - 1.9 mmol/L  D-dimer, quantitative (not at [facility])     Status: Abnormal   Collection Time: 09/24/16  7:38 PM  Result Value Ref Range   D-Dimer, Quant 0.91 (H) 0.00 - 0.50 ug/mL-FEU    Comment: (NOTE) At the manufacturer cut-off of 0.50 ug/mL FEU, this assay has been documented to exclude PE with a sensitivity and negative predictive value of 97 to 99%.  At this time, this assay has not been approved by the FDA to  exclude DVT/VTE. Results should be correlated with clinical presentation.   Protime-INR     Status: None   Collection Time: 09/24/16  7:38 PM  Result Value Ref Range   Prothrombin Time 15.2 11.4 - 15.2 seconds   INR 1.19   Comprehensive metabolic panel     Status: Abnormal   Collection Time: 09/24/16  8:01 PM  Result Value Ref Range   Sodium 145 135 - 145 mmol/L   Potassium 3.2 (L) 3.5 - 5.1 mmol/L   Chloride 104 101 - 111 mmol/L   CO2 33 (H) 22 - 32 mmol/L   Glucose, Bld 101 (H) 65 - 99 mg/dL   BUN 17 6 - 20 mg/dL   Creatinine, Ser 0.68 0.44 - 1.00 mg/dL   Calcium 8.1 (L) 8.9 - 10.3 mg/dL   Total Protein 5.9 (L) 6.5 - 8.1 g/dL   Albumin 2.8 (L) 3.5 - 5.0 g/dL   AST 21 15 - 41 U/L   ALT 14 14 - 54 U/L   Alkaline Phosphatase 80 38 - 126 U/L   Total Bilirubin 0.7 0.3 - 1.2 mg/dL   GFR calc non Af Amer >60 >60 mL/min   GFR calc Af Amer >60 >60 mL/min    Comment: (NOTE) The eGFR has been calculated using the CKD EPI equation. This calculation has not been validated in all clinical situations. eGFR's persistently <60 mL/min signify possible Chronic Kidney Disease.    Anion gap 8 5 - 15  CBC WITH DIFFERENTIAL     Status: Abnormal   Collection Time: 09/24/16  8:01 PM  Result Value Ref Range   WBC 8.1 4.0 - 10.5 K/uL   RBC 4.52 3.87 - 5.11 MIL/uL   Hemoglobin 11.1 (L) 12.0 - 15.0 g/dL   HCT 40.8 36.0 - 46.0 %   MCV 90.3 78.0 - 100.0 fL   MCH 24.6 (L) 26.0 - 34.0 pg   MCHC 27.2 (L) 30.0 - 36.0 g/dL   RDW 16.9 (H) 11.5 - 15.5 %   Platelets 159 150 - 400 K/uL   Neutrophils Relative % 71 %   Neutro Abs 5.8 1.7 - 7.7 K/uL   Lymphocytes Relative 18 %   Lymphs Abs 1.5 0.7 - 4.0 K/uL   Monocytes Relative 9 %   Monocytes Absolute 0.7 0.1 - 1.0 K/uL   Eosinophils Relative 1 %   Eosinophils Absolute 0.1 0.0 - 0.7 K/uL   Basophils Relative 1 %   Basophils Absolute 0.0 0.0 - 0.1 K/uL  TSH     Status: None   Collection Time: 09/24/16  8:01 PM  Result Value Ref Range   TSH 1.381  0.350 - 4.500 uIU/mL    Comment: Performed by a 3rd Generation assay with a functional sensitivity of <=0.01 uIU/mL.  Brain  natriuretic peptide     Status: Abnormal   Collection Time: 09/24/16  8:01 PM  Result Value Ref Range   B Natriuretic Peptide 384.6 (H) 0.0 - 100.0 pg/mL  Troponin I     Status: Abnormal   Collection Time: 09/24/16  8:01 PM  Result Value Ref Range   Troponin I 0.03 (HH) <0.03 ng/mL    Comment: CRITICAL RESULT CALLED TO, READ BACK BY AND VERIFIED WITH: V BROWN,RN 2126 09/24/2016 WBOND   Troponin I     Status: None   Collection Time: 09/25/16  2:27 AM  Result Value Ref Range   Troponin I <0.03 <0.03 ng/mL  Renal function panel     Status: Abnormal   Collection Time: 09/25/16  2:27 AM  Result Value Ref Range   Sodium 146 (H) 135 - 145 mmol/L   Potassium 3.5 3.5 - 5.1 mmol/L   Chloride 102 101 - 111 mmol/L   CO2 33 (H) 22 - 32 mmol/L   Glucose, Bld 99 65 - 99 mg/dL   BUN 17 6 - 20 mg/dL   Creatinine, Ser 0.74 0.44 - 1.00 mg/dL   Calcium 7.8 (L) 8.9 - 10.3 mg/dL   Phosphorus 4.5 2.5 - 4.6 mg/dL   Albumin 2.7 (L) 3.5 - 5.0 g/dL   GFR calc non Af Amer >60 >60 mL/min   GFR calc Af Amer >60 >60 mL/min    Comment: (NOTE) The eGFR has been calculated using the CKD EPI equation. This calculation has not been validated in all clinical situations. eGFR's persistently <60 mL/min signify possible Chronic Kidney Disease.    Anion gap 11 5 - 15  Troponin I     Status: None   Collection Time: 09/25/16  5:48 AM  Result Value Ref Range   Troponin I <0.03 <0.03 ng/mL  Magnesium     Status: None   Collection Time: 09/25/16  5:48 AM  Result Value Ref Range   Magnesium 2.0 1.7 - 2.4 mg/dL  CBC with Differential/Platelet     Status: Abnormal   Collection Time: 09/25/16  5:48 AM  Result Value Ref Range   WBC 6.5 4.0 - 10.5 K/uL   RBC 4.26 3.87 - 5.11 MIL/uL   Hemoglobin 10.5 (L) 12.0 - 15.0 g/dL   HCT 39.8 36.0 - 46.0 %   MCV 93.4 78.0 - 100.0 fL   MCH 24.6 (L) 26.0  - 34.0 pg   MCHC 26.4 (L) 30.0 - 36.0 g/dL   RDW 17.3 (H) 11.5 - 15.5 %   Platelets 180 150 - 400 K/uL   Neutrophils Relative % 66 %   Neutro Abs 4.3 1.7 - 7.7 K/uL   Lymphocytes Relative 21 %   Lymphs Abs 1.4 0.7 - 4.0 K/uL   Monocytes Relative 9 %   Monocytes Absolute 0.6 0.1 - 1.0 K/uL   Eosinophils Relative 3 %   Eosinophils Absolute 0.2 0.0 - 0.7 K/uL   Basophils Relative 1 %   Basophils Absolute 0.0 0.0 - 0.1 K/uL   X-ray Chest Pa And Lateral  Result Date: 09/24/2016 CLINICAL DATA:  Increased shortness of breath and hypoxia EXAM: CHEST  2 VIEW COMPARISON:  07/20/2015 FINDINGS: Tiny CP angle blunting on the lateral image consistent with tiny effusion. No focal consolidation. Elevated left diaphragm. Cardiomegaly with central vascular congestion. Mild diffuse interstitial prominence may reflect mild edema. Atherosclerosis. No pneumothorax. Surgical clips left axilla. IMPRESSION: 1. Tiny CP angle effusion 2. Cardiomegaly with central vascular congestion and mild diffuse interstitial opacities  suggestive of mild edema Electronically Signed   By: Donavan Foil M.D.   On: 09/24/2016 22:20   Ct Angio Chest Pe W Or Wo Contrast  Result Date: 09/25/2016 CLINICAL DATA:  Shortness of breath.  Chest pain. EXAM: CT ANGIOGRAPHY CHEST WITH CONTRAST TECHNIQUE: Multidetector CT imaging of the chest was performed using the standard protocol during bolus administration of intravenous contrast. Multiplanar CT image reconstructions and MIPs were obtained to evaluate the vascular anatomy. CONTRAST:  80 mL Isovue 370 COMPARISON:  Two-view chest x-ray 09/24/2016.  CTA chest 09/17/14 FINDINGS: Cardiovascular: The heart is mildly enlarged. A small pericardial effusion is present. Coronary artery calcifications are present. Atherosclerotic calcifications are also present at the aortic arch and origins the great vessels without significant focal stenosis. Pulmonary arterial opacification is excellent. No focal filling  defects to suggest pulmonary emboli. Mediastinum/Nodes: Peritracheal lymph nodes measure 1.3 cm in short access. Smaller prevascular and AP window nodes are present. Similar nodes were present on the prior exam. No significant axillary adenopathy is present. Lungs/Pleura: Moderate centrilobular emphysema is present. There is no focal nodule, mass, or airspace disease. No significant pleural effusion is present. Upper Abdomen: Gastric bypass surgery is noted. The upper abdomen is otherwise unremarkable. Musculoskeletal: Multilevel degenerative changes are present in the thoracic spine. Rightward curvature is stable. No focal lytic or blastic lesions are present. Review of the MIP images confirms the above findings. IMPRESSION: 1. No evidence for pulmonary embolus. 2. Cardiomegaly and a small pericardial effusion are stable without edema to suggest failure. 3. Coronary artery disease. 4. Aortic atherosclerosis. 5. Centrilobular emphysema. Electronically Signed   By: San Morelle M.D.   On: 09/25/2016 10:47    Assessment: 66 y.o. female with acute onset of mental status changes.  1. AMS without lateralizing findings on exam. Overall clinical picture most consistent with acute delirium in the setting of respiratory compromise.  2. Nausea with vomiting. At risk for aspiration given AMS.  3. Elevated D-dimer level of 0.91. No evidence for PE on CTA of chest.   Plan: 1. Would obtain STAT ABG and CXR.  2. Recommend monitoring pulmonary functions with respiratory therapy STAT.  3. Aspiration precautions.  4. Discussed with MICU attending.   '@Electronically'  signed: Dr. Kerney Elbe 09/26/2016, 5:47 AM

## 2016-09-26 NOTE — Significant Event (Signed)
Rapid Response Event Note RN called for pt unresponsive with guppy breathing  Overview: Time Called: 0523 Arrival Time: 0525 Event Type: Neurologic, Respiratory  Initial Focused Assessment: On arrival, pt gray in color, skin warm and dry, bilateral lungs fields diminished, unresponsive to verbal or tactile stimulus, prolonged expiratory rate, pupils 3 and non reactive, PCCM called a code stroke, pt transported to CT. After arriving to CT pt gradually awakened and responsive, able to give her name, age, and month, pt stated year as "2081". However pt unable to stay awake. Dr. Cheral Marker met [place] in CT, pt moved all extremities equally, answer most questions "yes" or "no". Code stroke cancelled. Pt vomited while in CT. Pt admitted for COPD exacerbation. BP 141/60, HR 91, RR 18, 97% 4L [place], rectal temp 99.0 Wheezing noted after vomiting.  Interventions: CT head w/o contrast, ABG 7.18/117/54.7/42.8, CXR to be completed, placed on BiPap Pt transferred to 2s14, Nester cameraed in on our arrival, orders for Zofran 32m IVP.    Event Summary: Name of Physician Notified: PCCM  (paged PTA RRT ) at    Name of Consulting Physician Notified: Dr. LCheral Marker at [place] Outcome: Transferred (Comment) (2s20)     SGevena Mart LSela Hua

## 2016-09-26 NOTE — Progress Notes (Signed)
Placed patient on Bipap due to abg results. IPAP=18 with EPAP=6cm and Fio2 60%

## 2016-09-26 NOTE — Progress Notes (Signed)
eLink Physician-Brief Progress Note Patient Name: [name] DOB: 07-09-1951 MRN: [id number]   Date of Service  09/26/2016  HPI/Events of Note  66 year old female admitted with acute on chronic hypoxic respiratory failure. Contacted by nursing staff regarding altered mental status. Reportedly patient has full gaze and is now responding appropriately. Has unilateral weakness. Reportedly has not received sedating medication. No sedating medications on my review of the medicine reconciliation.   eICU Interventions  1. Instructed nurse to call code stroke 2. Intensivist notified to perform bedside assessment as soon as possible      Intervention Category Major Interventions: Change in mental status - evaluation and management  Tera Partridge 09/26/2016, 5:37 AM

## 2016-09-26 NOTE — Progress Notes (Signed)
Name: [name] MRN: [id number] DOB: 1951-07-07    ADMISSION DATE:  09/24/16   CHIEF COMPLAINT:  Sob   BRIEF PATIENT DESCRIPTION:  66 yo female quit smoking 2 y PTA followed for morbid obesity  GOLD II COPD/Asthma  (FEV1 54%) with hx of hypercarbic/hypoxic RF previously on home O2 last in early 2017 . Previous severe exacerbation w/ vent support in 2015  Admit from office with progressive doe x one week with desats into the 70's at rest on RA  SIGNIFICANT EVENTS  Admitted from office 1/12 to [facility] tele  1/14 Transfer to ICU for AMS , failed BIPAP , Intubated    STUDIES:  12/2013 PFT> Ratio 46%, FEV1 1.24L (54% pred, 44% change), TLC 6.17L (127% pred), DLCO 10.82 (48% pred),  CTA Chest 07/2015 >Trace right-sided pleural effusion, and minimal interlobular septal thickening. Diffuse emphysema 07/2015 Echo >EF 55-60%, LA mod dilated , PAP 65 1/13 CTA chest neg PE , small pericardial effusion , emphysema   1/13 Echo>EF 65-70%, LA mod dilation, RA mild dilated, PAP 54.   HISTORY OF PRESENT ILLNESS:   66 yo female  Former smoker with GOLD II COPD on Dulera at home presented to clinic with 1 week of indolent onset  progressive DOE and low O2 sats but still comfortable at rest. . Says over last week has been more winded with walking around , intermittent chest tightness and with LE edema bilateral/ equal/mild. O2 sats at home in low 80s on room air . Was on O2 2l/m early 2017 but was d/c w/ no resolution on desaturations . On arrival to office O2 sat 69-70% on RA . Required 4l/m to get sat >90%. She denies significant cough , congestioin, or wheezing . No fever or discolored mcusu . No boy aches . She will be admitted for further evaluation and treatment . Transferred to [facility] W via EMS.   SUBJECTIVE:   Pt w/ altered mental status changes early am , unilateral weakness, Code Stroke was called with neuro consult. Pt to CT showed no acute process, chronic microvascular dz.  Vomited in CT. ABG  did show severe Hypercarbia with PCO2 at 117 (pH 7.187). She was placed on BIPAP but remains lethargic.   Wt Readings from Last 3 Encounters:  09/26/16 115.3 kg (254 lb 3.2 oz)  09/24/16 115.1 kg (253 lb 12.8 oz)  12/29/15 109.6 kg (241 lb 9.6 oz)    Vital signs reviewed   VITAL SIGNS: Temp:  [98.4 F (36.9 C)-99.7 F (37.6 C)] 99 F (37.2 C) (01/14 0518) Pulse Rate:  [82-96] 91 (01/14 0637) Resp:  [16-21] 21 (01/14 0637) BP: (132-176)/(59-77) 141/60 (01/14 0518) SpO2:  [90 %-99 %] 96 % (01/14 0637) Weight:  [115.3 kg (254 lb 3.2 oz)] 115.3 kg (254 lb 3.2 oz) (01/14 0518)  PHYSICAL EXAMINATION: GEN: altered , wakes to touch/voice, not following commands   HEENT:  [place]/AT , dry mucosa with BIPAP on.  NECK:  Supple ; no lymphadenopathy.    RESP  Decreased BS in bases ,  no accessory muscle use, no rales or wheeze  CARD:  RRR, no m/r/g  ,tr  peripheral edema   GI:   Soft & nt; nml bowel sounds; no organomegaly or masses detected.   Musco: Warm bil, no deformities or joint swelling noted.   Neuro: lethargic , not following commands.   Skin: Warm, no lesions or rashes    Recent Labs Lab 09/24/16 2001 09/25/16 0227  NA 145 146*  K 3.2* 3.5  CL 104 102  CO2 33* 33*  BUN 17 17  CREATININE 0.68 0.74  GLUCOSE 101* 99    Recent Labs Lab 09/24/16 2001 09/25/16 0548  HGB 11.1* 10.5*  HCT 40.8 39.8  WBC 8.1 6.5  PLT 159 180   X-ray Chest Pa And Lateral  Result Date: 09/24/2016 CLINICAL DATA:  Increased shortness of breath and hypoxia EXAM: CHEST  2 VIEW COMPARISON:  07/20/2015 FINDINGS: Tiny CP angle blunting on the lateral image consistent with tiny effusion. No focal consolidation. Elevated left diaphragm. Cardiomegaly with central vascular congestion. Mild diffuse interstitial prominence may reflect mild edema. Atherosclerosis. No pneumothorax. Surgical clips left axilla. IMPRESSION: 1. Tiny CP angle effusion 2. Cardiomegaly with central vascular congestion and  mild diffuse interstitial opacities suggestive of mild edema Electronically Signed   By: Donavan Foil M.D.   On: 09/24/2016 22:20   Ct Angio Chest Pe W Or Wo Contrast  Result Date: 09/25/2016 CLINICAL DATA:  Shortness of breath.  Chest pain. EXAM: CT ANGIOGRAPHY CHEST WITH CONTRAST TECHNIQUE: Multidetector CT imaging of the chest was performed using the standard protocol during bolus administration of intravenous contrast. Multiplanar CT image reconstructions and MIPs were obtained to evaluate the vascular anatomy. CONTRAST:  80 mL Isovue 370 COMPARISON:  Two-view chest x-ray 09/24/2016.  CTA chest 09/17/14 FINDINGS: Cardiovascular: The heart is mildly enlarged. A small pericardial effusion is present. Coronary artery calcifications are present. Atherosclerotic calcifications are also present at the aortic arch and origins the great vessels without significant focal stenosis. Pulmonary arterial opacification is excellent. No focal filling defects to suggest pulmonary emboli. Mediastinum/Nodes: Peritracheal lymph nodes measure 1.3 cm in short access. Smaller prevascular and AP window nodes are present. Similar nodes were present on the prior exam. No significant axillary adenopathy is present. Lungs/Pleura: Moderate centrilobular emphysema is present. There is no focal nodule, mass, or airspace disease. No significant pleural effusion is present. Upper Abdomen: Gastric bypass surgery is noted. The upper abdomen is otherwise unremarkable. Musculoskeletal: Multilevel degenerative changes are present in the thoracic spine. Rightward curvature is stable. No focal lytic or blastic lesions are present. Review of the MIP images confirms the above findings. IMPRESSION: 1. No evidence for pulmonary embolus. 2. Cardiomegaly and a small pericardial effusion are stable without edema to suggest failure. 3. Coronary artery disease. 4. Aortic atherosclerosis. 5. Centrilobular emphysema. Electronically Signed   By: San Morelle M.D.   On: 09/25/2016 10:47   Ct Head Code Stroke Wo Contrast  Result Date: 09/26/2016 CLINICAL DATA:  Code stroke.  Unresponsive patient. EXAM: CT HEAD WITHOUT CONTRAST TECHNIQUE: Contiguous axial images were obtained from the base of the skull through the vertex without intravenous contrast. COMPARISON:  None. FINDINGS: Brain: No mass lesion, intraparenchymal hemorrhage or extra-axial collection. No evidence of acute cortical infarct. There is periventricular hypoattenuation compatible with chronic microvascular disease. Vascular: No hyperdense vessel or unexpected calcification. Skull: Normal visualized skull base, calvarium and extracranial soft tissues. Sinuses/Orbits: No sinus fluid levels or advanced mucosal thickening. No mastoid effusion. Normal orbits. ASPECTS [facility] Stroke Program Early CT Score) - Ganglionic level infarction (caudate, lentiform nuclei, internal capsule, insula, M1-M3 cortex): 7 - Supraganglionic infarction (M4-M6 cortex): 3 Total score (0-10 with 10 being normal): 10 IMPRESSION: 1. Chronic microvascular ischemia without acute intracranial abnormality. 2. ASPECTS is 10. These results were called by telephone at the time of interpretation on 09/26/2016 at 6:06 am to Dr. Germain Osgood , who verbally acknowledged these results. Electronically Signed  By: Ulyses Jarred M.D.   On: 09/26/2016 06:06    ASSESSMENT / PLAN:  1. Acute on Chronic Hypoxic /Hypercarbic Respiratory Failure (not on home O2 since early 2017 ) w/ underlying pulmonary HTN . Neg for PE-  She has worsening hypercarbia with AMS -will require intubation and vent support  1/14 ETT >>  Plan  Intubate,.  Vent Support  Assess daily SBT/Wean VAP Check ABG in 1 hr and in am  Check CXR    2. COPD GOLD II/Asthma  - 12/2013 PFT> Ratio 46%, FEV1  1.24L (54% pred, 44% change), TLC 6.17L (127% pred), DLCO 10.82 (48% pred) -pt did vomit 1/14, monitor for aspiration   Plan  Cont Duoneb Four times a day     Change Dulera to Pulmicort Neb Twice daily    3. Diastolic CHF w/ Pulmonary HTN   Echo 1/13 showed preserved EF, mild-mod LA/RA dilation Hep DVT prophylaxis   Plan  Lasix 40mg  x1  Strict I/O   4) Morbid obesity > needs nutrition counseling  TSH WNL  5) Anemia, Fe def. Hgb 10.5 Plan- Stool OB   6) Hypokalemia resolved   Plan- Check bmet today  Replace electrolytes as indicated  Tammy Parrett  Pulmonary and [place] p [phone] After 3:00 PMPager: [phone]  09/26/2016, 7:35 AM

## 2016-09-26 NOTE — Progress Notes (Signed)
ABG results given to [place], Therapist, sports.

## 2016-09-26 NOTE — Progress Notes (Signed)
eLink Physician-Brief Progress Note Patient Name: [name] DOB: 01-12-1951 MRN: [id number]   Date of Service  09/26/2016  HPI/Events of Note  Patient transferred to ICU from [place]. ABG from 6 AM reviewed showing acute hypercarbic respiratory failure. Camera check shows patient with eyes open currently on BiPAP support. Nursing staff reports patient still not responding appropriately.   eICU Interventions  1. Zofran 4 mg IV 1 2. Awaiting stat portable chest x-ray 3. Continuing BiPAP for now 4. Lasix Foley catheter due to incontinence      Intervention Category Major Interventions: Respiratory failure - evaluation and management  Tera Partridge 09/26/2016, 6:55 AM

## 2016-09-26 NOTE — Progress Notes (Signed)
ABG results give to Tammy, NP.

## 2016-09-26 NOTE — Progress Notes (Addendum)
Called to bedside for acute change in mental status. Patient OTF in CT for Code Stroke eval.   Vitals:   09/25/16 2119 09/25/16 2309 09/26/16 0248 09/26/16 0518  BP: (!) 176/77 (!) 165/76 (!) 147/66 (!) 141/60  Pulse: 94 96 90 91  Resp: 18  20 18   Temp: 98.8 F (37.1 C)  99.7 F (37.6 C) 99 F (37.2 C)  TempSrc: Oral  Oral Oral  SpO2: 99%  93% 97%   Results for orders placed or performed during the hospital encounter of 09/24/16 (from the past 24 hour(s))  ECHOCARDIOGRAM COMPLETE   Collection Time: 09/25/16  2:50 PM  Result Value Ref Range   Weight 4,060.8 oz   Height 65.000 in   BP 147/59 mmHg  Blood gas, arterial   Collection Time: 09/26/16  6:01 AM  Result Value Ref Range   O2 Content 4.0 L/min   Delivery systems NASAL CANNULA    pH, Arterial 7.187 (LL) 7.350 - 7.450   pCO2 arterial 117 (HH) 32.0 - 48.0 mmHg   pO2, Arterial 54.7 (L) 83.0 - 108.0 mmHg   Bicarbonate 42.8 (H) 20.0 - 28.0 mmol/L   Acid-Base Excess 14.3 (H) 0.0 - 2.0 mmol/L   O2 Saturation 85.2 %   Patient temperature 98.6    Collection site RIGHT RADIAL    Drawn by [id number]    Sample type ARTERIAL DRAW    Allens test (pass/fail) PASS PASS   Exam: On return from CT, patient awake and responsive, but poorly attentive. Has difficulty answering questions, but is able to follow commands. Mental status exam with limited comprehension and poor expressive abilities. Will answer yes, no, ok. Limited neuro exam largely normal. Questionable RUE weakness, but appears more effort related.  Breath sounds diminished with prolonged expiratory phase. Occasional wheeze. Tachycardic, s1, s2, II/VI murmur. No edema. Reportedly vomited during CT, but currently denies nausea.   CT negative for acute intracranial process on verbal report, consistent with final interpretation.   CXR pending  Assessment:  Acute on chronic hypercapneic respiratory failure with significant acidemia Doubt acute stroke  Plan: Transfer to ICU (step  down overflow) DuoNeb BiPAP If nauseous or recurrent vomiting, may require intubation CXR Await neuro recs  The patient is critically ill with multiple organ system failure and requires high complexity decision making for assessment and support, frequent evaluation and titration of therapies, advanced monitoring, review of radiographic studies and interpretation of complex data.   Critical Care Time devoted to patient care services, exclusive of separately billable procedures, described in this note is 35 minutes.    Yisroel Ramming, MD Pulmonary and Critical Care 09/26/16 6:33 AM

## 2016-09-27 ENCOUNTER — Inpatient Hospital Stay (HOSPITAL_COMMUNITY): Payer: Medicare Other

## 2016-09-27 DIAGNOSIS — J9622 Acute and chronic respiratory failure with hypercapnia: Secondary | ICD-10-CM

## 2016-09-27 DIAGNOSIS — J439 Emphysema, unspecified: Secondary | ICD-10-CM

## 2016-09-27 LAB — CBC
HCT: 36.9 % (ref 36.0–46.0)
Hemoglobin: 9.8 g/dL — ABNORMAL LOW (ref 12.0–15.0)
MCH: 24.8 pg — ABNORMAL LOW (ref 26.0–34.0)
MCHC: 26.6 g/dL — ABNORMAL LOW (ref 30.0–36.0)
MCV: 93.4 fL (ref 78.0–100.0)
Platelets: 171 10*3/uL (ref 150–400)
RBC: 3.95 MIL/uL (ref 3.87–5.11)
RDW: 17.1 % — ABNORMAL HIGH (ref 11.5–15.5)
WBC: 6.8 10*3/uL (ref 4.0–10.5)

## 2016-09-27 LAB — PHOSPHORUS: Phosphorus: 3.4 mg/dL (ref 2.5–4.6)

## 2016-09-27 LAB — GLUCOSE, CAPILLARY
Glucose-Capillary: 83 mg/dL (ref 65–99)
Glucose-Capillary: 95 mg/dL (ref 65–99)
Glucose-Capillary: 98 mg/dL (ref 65–99)

## 2016-09-27 LAB — BLOOD GAS, ARTERIAL
Acid-Base Excess: 14 mmol/L — ABNORMAL HIGH (ref 0.0–2.0)
Bicarbonate: 40.1 mmol/L — ABNORMAL HIGH (ref 20.0–28.0)
Drawn by: 30136
FIO2: 50
MECHVT: 460 mL
O2 Saturation: 95.9 %
PEEP: 5 cmH2O
Patient temperature: 98.6
RATE: 16 resp/min
pCO2 arterial: 72.8 mmHg (ref 32.0–48.0)
pH, Arterial: 7.36 (ref 7.350–7.450)
pO2, Arterial: 84.4 mmHg (ref 83.0–108.0)

## 2016-09-27 LAB — BASIC METABOLIC PANEL
Anion gap: 8 (ref 5–15)
BUN: 21 mg/dL — ABNORMAL HIGH (ref 6–20)
CO2: 37 mmol/L — ABNORMAL HIGH (ref 22–32)
Calcium: 7.7 mg/dL — ABNORMAL LOW (ref 8.9–10.3)
Chloride: 100 mmol/L — ABNORMAL LOW (ref 101–111)
Creatinine, Ser: 0.64 mg/dL (ref 0.44–1.00)
GFR calc Af Amer: >60 mL/min (ref >60)
GFR calc non Af Amer: >60 mL/min (ref >60)
Glucose, Bld: 75 mg/dL (ref 65–99)
Potassium: 4.6 mmol/L (ref 3.5–5.1)
Sodium: 145 mmol/L (ref 135–145)

## 2016-09-27 LAB — MAGNESIUM: Magnesium: 2 mg/dL (ref 1.7–2.4)

## 2016-09-27 MED ORDER — PRO-STAT SUGAR FREE PO LIQD
60.0000 mL | Freq: Four times a day (QID) | ORAL | Status: DC
  Administered 2016-09-27 – 2016-09-28 (×4): 60 mL
  Filled 2016-09-27 (×4): qty 60

## 2016-09-27 MED ORDER — FENTANYL CITRATE (PF) 100 MCG/2ML IJ SOLN
50.0000 ug | INTRAMUSCULAR | Status: DC | PRN
  Administered 2016-09-27 – 2016-09-28 (×9): 50 ug via INTRAVENOUS
  Filled 2016-09-27 (×7): qty 2

## 2016-09-27 MED ORDER — PRO-STAT SUGAR FREE PO LIQD: 30.0000 mL | Freq: Two times a day (BID) | ORAL | Status: DC

## 2016-09-27 MED ORDER — NICOTINE 14 MG/24HR TD PT24
14.0000 mg | MEDICATED_PATCH | Freq: Every day | TRANSDERMAL | Status: DC
  Administered 2016-09-27 – 2016-09-30 (×4): 14 mg via TRANSDERMAL
  Filled 2016-09-27 (×4): qty 1

## 2016-09-27 MED ORDER — VITAL HIGH PROTEIN PO LIQD
1000.0000 mL | ORAL | Status: DC
  Administered 2016-09-27 (×3): 1000 mL
  Administered 2016-09-28: 09:00:00

## 2016-09-27 NOTE — Care Management Note (Signed)
Case Management Note  Patient Details  Name: [name] MRN: [id number] Date of Birth: 1951-03-30  Subjective/Objective:    Pt admitted with resp failure - hx of COPD and non compliance                Action/Plan:   PTA independent from home with mom and brother.  Information obtained from daughter as pt is ventilated; daughter voiced concerns about mom's pyschological health - pt has stated "there is no reason for her to be here" , pt is non compliant regarding COPD management  and following up with PCP.  CM consulted CSW for possible community resources.  Bedside nurse also made aware of concern and will discuss possible psych consult with attending post extubation.  CM will continue to follow for discharge needs   Expected Discharge Date:                  Expected Discharge Plan:  [place]  In-House Referral:  Clinical Social Work  Discharge planning Services  CM Consult  Post Acute Care Choice:    Choice offered to:     DME Arranged:    DME Agency:     HH Arranged:    [place] Agency:     Status of Service:  In process, will continue to follow  If discussed at Long Length of Stay Meetings, dates discussed:    Additional Comments:  Maryclare Labrador, RN 09/27/2016, 2:16 PM

## 2016-09-27 NOTE — Progress Notes (Signed)
Initial Nutrition Assessment  DOCUMENTATION CODES:   Morbid obesity  INTERVENTION:    Initiate Vital High Protein at goal rate of 20 ml/h (240 ml per day) and Prostat 60 ml QID   TF regimen + Propofol infusion to provide 1829 kcals, 162 gm protein, 401 ml of free water  NUTRITION DIAGNOSIS:   Inadequate oral intake related to inability to eat as evidenced by NPO status  GOAL:   Provide needs based on ASPEN/SCCM guidelines  MONITOR:   TF tolerance, Vent status, Labs, Weight trends, I & O's  REASON FOR ASSESSMENT:   Consult Enteral/tube feeding initiation and management  ASSESSMENT:   66 y.o. Female quit smoking 2 y PTA followed for morbid obesity  GOLD II COPD (FEV1 54%) with hx of hypercarbic/hypoxic RF previously on home O2 last in early 2017 . Previous severe exacerbation w/ vent support in 2015  Admit from office with progressive doe x one week with desats into the 70's at rest on RA.  Patient is currently intubated on ventilator support >> OGT in place Temp (24hrs), Avg:98.7 F (37.1 C), Min:98 F (36.7 C), Max:99 F (37.2 C)  Propofol: 20.8 ml/hr >> 549 fat kcals  Pt transferred from 5W to SICU with AMS & worsening respiratory failure. Likely some element of decompensated CHF. Also with severe COPD with emphysema. Labs and medications reviewed. CBG 83.  Diet Order:  Diet NPO time specified  Skin:  Reviewed, no issues  Last BM:  PTA  Height:   Ht Readings from Last 1 Encounters:  09/24/16 5\' 5"  (1.651 m)    Weight:   Wt Readings from Last 1 Encounters:  09/27/16 251 lb 12.3 oz (114.2 kg)    Ideal Body Weight:  56.8 kg  BMI:  Body mass index is 41.9 kg/m.  Estimated Nutritional Needs:   Kcal:  [id number]  Protein:  >/= 155 gm  Fluid:  per MD  EDUCATION NEEDS:   No education needs identified at this time  Arthur Holms, RD, LDN Pager #: [phone] After-Hours Pager #: [phone]

## 2016-09-27 NOTE — Progress Notes (Signed)
PULMONARY / CRITICAL CARE MEDICINE   Name: [name] MRN: [id number] DOB: Feb 04, 1951    ADMISSION DATE:  09/24/2016  HISTORY OF PRESENT ILLNESS:  66 y.o. female quit smoking 2 y PTA followed for morbid obesity  GOLD II COPD (FEV1 54%) with hx of hypercarbic/hypoxic RF previously on home O2 last in early 2017 . Previous severe exacerbation w/ vent support in 2015  Admit from office with progressive doe x one week with desats into the 70's at rest on RA.  SUBJECTIVE: Patient was endotracheal intubated yesterday for respiratory failure. No other acute events overnight. Nurse reports slight decrease in urine output.   REVIEW OF SYSTEMS: Unable to obtain given intubation and sedation.  VITAL SIGNS: BP (!) 143/64   Pulse 76   Temp 98.9 F (37.2 C) (Axillary)   Resp 20   Wt 251 lb 12.3 oz (114.2 kg)   SpO2 94%   BMI 41.90 kg/m   HEMODYNAMICS:    VENTILATOR SETTINGS: Vent Mode: PRVC FiO2 (%):  [50 %] 50 % Set Rate:  [16 bmp] 16 bmp Vt Set:  [460 mL] 460 mL PEEP:  [5 cmH20] 5 cmH20 Plateau Pressure:  [13 cmH20-22 cmH20] 14 cmH20  INTAKE / OUTPUT: I/O last 3 completed shifts: In: 532.7 [I.V.:442.7; NG/GT:90] Out: [id number]; Emesis/NG output:250]  PHYSICAL EXAMINATION: General: Sedated. No acute distress. Daughter at bedside.  Integument:  Warm & dry. No rash on exposed skin.  Extremities:  No cyanosis.  HEENT:  No scleral injection or icterus. Endotracheal tube in place. PERRL. Cardiovascular:  Regular rate. No edema. No appreciable JVD given body habitus.  Pulmonary:  Distant & diminished breath sounds. Prolonged exhalation. Symmetric chest rise on ventilator. Abdomen: Soft. Normal bowel sounds. Protuberant. Grossly nontender. Musculoskeletal:  Normal bulk and tone. No joint deformity or effusion appreciated. Neurological:  Sedated. Opens eyes with stimulation. Not following commands.   LABS:  BMET  Recent Labs Lab 09/25/16 0227 09/26/16 1043 09/27/16 0529   NA 146* 146* 145  K 3.5 4.7 4.6  CL 102 102 100*  CO2 33* 36* 37*  BUN 17 16 21*  CREATININE 0.74 0.65 0.64  GLUCOSE 99 96 75    Electrolytes  Recent Labs Lab 09/25/16 0227 09/25/16 0548 09/26/16 1043 09/27/16 0529  CALCIUM 7.8*  --  7.8* 7.7*  MG  --  2.0  --  2.0  PHOS 4.5  --   --  3.4    CBC  Recent Labs Lab 09/24/16 2001 09/25/16 0548 09/27/16 0529  WBC 8.1 6.5 6.8  HGB 11.1* 10.5* 9.8*  HCT 40.8 39.8 36.9  PLT 159 180 171    Coag's  Recent Labs Lab 09/24/16 1938  INR 1.19    Sepsis Markers  Recent Labs Lab 09/24/16 1932  LATICACIDVEN 1.1    ABG  Recent Labs Lab 09/26/16 0601 09/26/16 0927  PHART 7.187* 7.254*  PCO2ART 117* 93.7*  PO2ART 54.7* 59.0*    Liver Enzymes  Recent Labs Lab 09/24/16 2001 09/25/16 0227  AST 21  --   ALT 14  --   ALKPHOS 80  --   BILITOT 0.7  --   ALBUMIN 2.8* 2.7*    Cardiac Enzymes  Recent Labs Lab 09/24/16 2001 09/25/16 0227 09/25/16 0548  TROPONINI 0.03* <0.03 <0.03    Glucose  Recent Labs Lab 09/27/16 0858  GLUCAP 83    Imaging Dg Chest Port 1 View  Result Date: 09/27/2016 CLINICAL DATA:  Respiratory failure, intubated EXAM: PORTABLE CHEST 1 VIEW COMPARISON:  09/26/2016 FINDINGS: Cardiomediastinal silhouette is stable. Endotracheal tube in place with tip 2.3 cm above the carina. NG tube in place. Central vascular congestion without convincing pulmonary edema. Again noted elevation of the left hemidiaphragm. Bilateral basilar streaky atelectasis or infiltrate. Surgical clips in left axilla again noted. IMPRESSION: Central vascular congestion without convincing pulmonary edema. Endotracheal and NG tube in place. Streaky bilateral basilar atelectasis or infiltrate. Electronically Signed   By: Lahoma Crocker M.D.   On: 09/27/2016 08:04     STUDIES:  PFT 01/03/14: FVC 2.05 L (69%) FEV1 0.85 L (37%) FEV1/FVC 0.42 FEF 25-75 0.36 L (78%) positive bronchodilator response TLC 6.17 L (127%) RV  195% DLCO uncorrected 48% TTE 09/25/16:  Mild concentric hypertrophy with EF 65-70%. No regional wall motion abnormalities grade 1 diastolic dysfunction. LA moderately dilated & RA mildly dilated. RV normal in size and function. Mild aortic stenosis without regurgitation. Aortic root normal in size. No mitral stenosis or regurgitation. Trivial pulmonic regurgitation without stenosis. Moderate tricuspid regurgitation. No pericardial effusion. CTA CHEST 09/25/16: IMPRESSION: 1. No evidence for pulmonary embolus. 2. Cardiomegaly and a small pericardial effusion are stable without edema to suggest failure. 3. Coronary artery disease. 4. Aortic atherosclerosis. 5. Centrilobular emphysema. CT HEAD W/O 09/26/16: IMPRESSION: 1. Chronic microvascular ischemia without acute intracranial abnormality. 2. ASPECTS is 10.  MICROBIOLOGY: MRSA PCR 1/14:  Negative   ANTIBIOTICS: None  SIGNIFICANT EVENTS: 1/12 - Admit 1/14 - Transfer to ICU with altered mental status & worsening respiratory failure - Code Stroke called on the floor  LINES/TUBES: OETT 7.5 1/14 >> FOLEY 1/14 >> OGT 1/14 >> PIV x2  ASSESSMENT / PLAN:  PULMONARY A: Acute on Chronic Hypoxic Respiratory Failure - Multifactorial. Likely some element of decompensated CHF. Acute on Chronic Hypercarbic Respiratory Failure Severe COPD w/ Emphysema Tobacco Use Disorder  P:   Full Vent Support Pulmicort 0.25mg  Neb BID Duoneb q6hr Scheduled Daily PS [employer] SBT [employer] Dulera Nicotine Patch 14mg /24 hr Checking ABG now  CARDIOVASCULAR A:  Essential Hypertension Diastolic CHF  P:  Vitals per Unit Protocol Continuous Telemetry Monitoring  RENAL A:   Hypernatremia - Mild. Resolved.  Hypokalemia - Resolved.   P:   Trending UOP with Foley Monitoring renal function & electrolytes daily Replacing electrolytes as indicated  GASTROINTESTINAL A:   No acute issues.  P:   NPO Tube Feedings per Dietary Recommendations Protonix  IV daily Checking Abd X-ray to confirm enteric tube placement  HEMATOLOGIC A:   Anemia - Hgb stable. No signs of active bleeding.  P:  Trending cell counts daily w/ CBC SCDs Heparin [place] q8hr  INFECTIOUS A:   No acute infection.  P:   Monitoring for signs of infection off antibiotics  ENDOCRINE A:   No acute issues.  P:   Monitor glucose on daily chemistry panel  NEUROLOGIC A:   Acute Encephalopathy - Multifactorial from hypoxia and hypercarbia. CT Head negative 1/14. Sedation on Ventilator  P:   RASS goal: 0 to -1 Propofol gtt Fentanyl IV prn  FAMILY  - Updates:  Daughter updated by Dr. Ashok Cordia 1/15 at bedside.   - Inter-disciplinary family meet or Palliative Care meeting due by:  1/21  TODAY'S SUMMARY:  66 y.o. female with known history of chronic hypoxic and hypercarbic respiratory failure. Patient has no signs of volume overload today therefore do not feel further Lasix diuresis as necessary. Checking ABG to ensure adequate ventilation. Initiating tube feedings per dietary recommendations after confirming enteric feeding tubes position.  I have  spent a total of 39 minutes of critical care time caring for the patient and reviewing the patient's electronic medical record.  Sonia Baller Ashok Cordia, M.D. [facility] Pulmonary & Critical Care Pager:  [phone] After 3pm or if no response, call [phone] 09/27/2016, 11:04 AM

## 2016-09-27 NOTE — Progress Notes (Signed)
Reviewed, agree 

## 2016-09-27 NOTE — Progress Notes (Signed)
MD made aware of decreased urine output, no new orders. Will continue to monitor.

## 2016-09-28 LAB — GLUCOSE, CAPILLARY
Glucose-Capillary: 70 mg/dL (ref 65–99)
Glucose-Capillary: 77 mg/dL (ref 65–99)
Glucose-Capillary: 82 mg/dL (ref 65–99)
Glucose-Capillary: 83 mg/dL (ref 65–99)
Glucose-Capillary: 84 mg/dL (ref 65–99)
Glucose-Capillary: 89 mg/dL (ref 65–99)

## 2016-09-28 LAB — CBC WITH DIFFERENTIAL/PLATELET
Basophils Absolute: 0 10*3/uL (ref 0.0–0.1)
Basophils Relative: 0 %
Eosinophils Absolute: 0.2 10*3/uL (ref 0.0–0.7)
Eosinophils Relative: 3 %
HCT: 34.9 % — ABNORMAL LOW (ref 36.0–46.0)
Hemoglobin: 9.5 g/dL — ABNORMAL LOW (ref 12.0–15.0)
Lymphocytes Relative: 22 %
Lymphs Abs: 1.2 10*3/uL (ref 0.7–4.0)
MCH: 24.9 pg — ABNORMAL LOW (ref 26.0–34.0)
MCHC: 27.2 g/dL — ABNORMAL LOW (ref 30.0–36.0)
MCV: 91.4 fL (ref 78.0–100.0)
Monocytes Absolute: 0.5 10*3/uL (ref 0.1–1.0)
Monocytes Relative: 10 %
Neutro Abs: 3.3 10*3/uL (ref 1.7–7.7)
Neutrophils Relative %: 65 %
Platelets: 147 10*3/uL — ABNORMAL LOW (ref 150–400)
RBC: 3.82 MIL/uL — ABNORMAL LOW (ref 3.87–5.11)
RDW: 17.7 % — ABNORMAL HIGH (ref 11.5–15.5)
WBC: 5.1 10*3/uL (ref 4.0–10.5)

## 2016-09-28 LAB — RENAL FUNCTION PANEL
Albumin: 2.4 g/dL — ABNORMAL LOW (ref 3.5–5.0)
Anion gap: 7 (ref 5–15)
BUN: 24 mg/dL — ABNORMAL HIGH (ref 6–20)
CO2: 36 mmol/L — ABNORMAL HIGH (ref 22–32)
Calcium: 7.6 mg/dL — ABNORMAL LOW (ref 8.9–10.3)
Chloride: 100 mmol/L — ABNORMAL LOW (ref 101–111)
Creatinine, Ser: 0.45 mg/dL (ref 0.44–1.00)
GFR calc Af Amer: >60 mL/min (ref >60)
GFR calc non Af Amer: >60 mL/min (ref >60)
Glucose, Bld: 88 mg/dL (ref 65–99)
Phosphorus: 3.6 mg/dL (ref 2.5–4.6)
Potassium: 3.9 mmol/L (ref 3.5–5.1)
Sodium: 143 mmol/L (ref 135–145)

## 2016-09-28 LAB — MAGNESIUM: Magnesium: 2 mg/dL (ref 1.7–2.4)

## 2016-09-28 MED ORDER — ORAL CARE MOUTH RINSE
15.0000 mL | Freq: Two times a day (BID) | OROMUCOSAL | Status: DC
  Administered 2016-09-29 – 2016-10-03 (×7): 15 mL via OROMUCOSAL

## 2016-09-28 NOTE — Progress Notes (Signed)
CSW spoke to patients daughter Belenda Cruise via phone. Belenda Cruise stated she had concerns about her mothers health because in the past 3 years she had been hospitalized for reasons related to patients COPD. Daughter stated that she does not think patients is taking care of herself, nor is she following doctors instructions. Belenda Cruise believes   that her mother has depression due to some of the varies comments she has made in the past. Belenda Cruise stated she would like some information on outpatients therapist that can do both family and individual therapy. Belenda Cruise also stated she would like resources on how to help patient stop smoking. CSW remains available for support and discharge needs.  Rhea Pink, MSW,  [place]

## 2016-09-28 NOTE — Procedures (Signed)
Extubation Procedure Note  Patient Details:   Name: [name] DOB: 1951/08/31 MRN: [id number]   Airway Documentation:     Evaluation  O2 sats: stable throughout Complications: No apparent complications Patient did tolerate procedure well. Bilateral Breath Sounds: Clear, Diminished   Yes   Positive cuff leak noted.  Pt placed on nasal cannula 6 Lpm with humidity, sat 86%. Pt placed on [place] HFNC 15 Lpm sat recovered to 92%. Pt able to get 1600 using IS.  Bayard Beaver 09/28/2016, 12:39 PM

## 2016-09-28 NOTE — Progress Notes (Signed)
PULMONARY / CRITICAL CARE MEDICINE   Name: [name] MRN: [id number] DOB: 04-02-1951    ADMISSION DATE:  09/24/2016  HISTORY OF PRESENT ILLNESS:  66 y.o. female quit smoking 2 y PTA followed for morbid obesity  GOLD II COPD (FEV1 54%) with hx of hypercarbic/hypoxic RF previously on home O2 last in early 2017 . Previous severe exacerbation w/ vent support in 2015  Admit from office with progressive doe x one week with desats into the 70's at rest on RA.  SUBJECTIVE: No acute events overnight. Patient has tolerated SBT on PS 5/5 this morning.   REVIEW OF SYSTEMS: Unable to obtain given intubation.  VITAL SIGNS: BP 132/69   Pulse 84   Temp 99 F (37.2 C) (Oral)   Resp 19   Ht 5\' 5"  (1.651 m)   Wt 253 lb 8.5 oz (115 kg)   SpO2 92%   BMI 42.19 kg/m   HEMODYNAMICS:    VENTILATOR SETTINGS: Vent Mode: CPAP;PSV FiO2 (%):  [40 %-50 %] 40 % Set Rate:  [16 bmp] 16 bmp Vt Set:  [460 mL] 460 mL PEEP:  [5 cmH20] 5 cmH20 Pressure Support:  [0 cmH20-5 cmH20] 0 cmH20 Plateau Pressure:  [13 cmH20-16 cmH20] 14 cmH20  INTAKE / OUTPUT: I/O last 3 completed shifts: In: 1898.1 [I.V.:832.6; Other:20; NG/GT:1045.5] Out: 1400 [Urine:1100; Emesis/NG output:300]  PHYSICAL EXAMINATION: General: No distress. Daughter at bedside. Awake. Integument: Warm and dry. No rash on exposed skin. Pulmonary: Again diminished breath sounds but symmetric. Normal work of breathing on pressure support 0/5. Tidal volume greater than 1000 mL. Cardiovascular: Regular rate. No JVD. No edema. Abdomen: Soft. Protuberant. Nontender. Neurological: Strength 5/5 and symmetric. Following commands. Moving all 4 extremities equally. Cranial nerves grossly intact.  LABS:  BMET  Recent Labs Lab 09/26/16 1043 09/27/16 0529 09/28/16 0252  NA 146* 145 143  K 4.7 4.6 3.9  CL 102 100* 100*  CO2 36* 37* 36*  BUN 16 21* 24*  CREATININE 0.65 0.64 0.45  GLUCOSE 96 75 88    Electrolytes  Recent Labs Lab  09/25/16 0227 09/25/16 0548 09/26/16 1043 09/27/16 0529 09/28/16 0252  CALCIUM 7.8*  --  7.8* 7.7* 7.6*  MG  --  2.0  --  2.0 2.0  PHOS 4.5  --   --  3.4 3.6    CBC  Recent Labs Lab 09/25/16 0548 09/27/16 0529 09/28/16 0252  WBC 6.5 6.8 5.1  HGB 10.5* 9.8* 9.5*  HCT 39.8 36.9 34.9*  PLT 180 171 147*    Coag's  Recent Labs Lab 09/24/16 1938  INR 1.19    Sepsis Markers  Recent Labs Lab 09/24/16 1932  LATICACIDVEN 1.1    ABG  Recent Labs Lab 09/26/16 0601 09/26/16 0927 09/27/16 1206  PHART 7.187* 7.254* 7.360  PCO2ART 117* 93.7* 72.8*  PO2ART 54.7* 59.0* 84.4    Liver Enzymes  Recent Labs Lab 09/24/16 2001 09/25/16 0227 09/28/16 0252  AST 21  --   --   ALT 14  --   --   ALKPHOS 80  --   --   BILITOT 0.7  --   --   ALBUMIN 2.8* 2.7* 2.4*    Cardiac Enzymes  Recent Labs Lab 09/24/16 2001 09/25/16 0227 09/25/16 0548  TROPONINI 0.03* <0.03 <0.03    Glucose  Recent Labs Lab 09/27/16 0858 09/27/16 2109 09/27/16 2358 09/28/16 0416 09/28/16 0919 09/28/16 1144  GLUCAP 83 98 95 83 82 89    Imaging Dg Abd Portable 1v  Result Date: 09/27/2016 CLINICAL DATA:  66 year old female OG tube placement. Initial encounter. EXAM: PORTABLE ABDOMEN - 1 VIEW COMPARISON:  Abdominal series 07/19/15. Portable chest radiograph 0624 hours today. FINDINGS: Portable AP supine view at 1213 hours. An enteric tube courses to the left upper quadrant and appears looped in the stomach. Negative visible bowel gas pattern. Stable visible left lung base. Calcified aortic atherosclerosis. No acute osseous abnormality identified. IMPRESSION: 1. Enteric tube looped in the proximal stomach. 2. Normal visible bowel gas pattern. 3.  Calcified aortic atherosclerosis. Electronically Signed   By: Genevie Ann M.D.   On: 09/27/2016 12:30     STUDIES:  PFT 01/03/14: FVC 2.05 L (69%) FEV1 0.85 L (37%) FEV1/FVC 0.42 FEF 25-75 0.36 L (78%) positive bronchodilator response TLC 6.17 L  (127%) RV 195% DLCO uncorrected 48% TTE 09/25/16:  Mild concentric hypertrophy with EF 65-70%. No regional wall motion abnormalities grade 1 diastolic dysfunction. LA moderately dilated & RA mildly dilated. RV normal in size and function. Mild aortic stenosis without regurgitation. Aortic root normal in size. No mitral stenosis or regurgitation. Trivial pulmonic regurgitation without stenosis. Moderate tricuspid regurgitation. No pericardial effusion. CTA CHEST 09/25/16: IMPRESSION: 1. No evidence for pulmonary embolus. 2. Cardiomegaly and a small pericardial effusion are stable without edema to suggest failure. 3. Coronary artery disease. 4. Aortic atherosclerosis. 5. Centrilobular emphysema. CT HEAD W/O 09/26/16: IMPRESSION: 1. Chronic microvascular ischemia without acute intracranial abnormality. 2. ASPECTS is 10.  MICROBIOLOGY: MRSA PCR 1/14:  Negative   ANTIBIOTICS: None  SIGNIFICANT EVENTS: 1/12 - Admit 1/14 - Transfer to ICU with altered mental status & worsening respiratory failure - Code Stroke called on the floor  LINES/TUBES: OETT 7.5 1/14 >> FOLEY 1/14 >> OGT 1/14 >> PIV x2  ASSESSMENT / PLAN:  PULMONARY A: Acute on Chronic Hypoxic Respiratory Failure - Multifactorial. Likely some element of decompensated CHF. Acute on Chronic Hypercarbic Respiratory Failure Severe COPD w/ Emphysema Tobacco Use Disorder  P:   Checking for a cuff leak and if present plan for extubation Continuing BiPAP as needed Targeting saturation 88-94% to prevent V/Q mismatching Pulmicort 0.25mg  Neb BID Duoneb q6hr Scheduled Holding Dulera Nicotine Patch 14mg /24 hr  CARDIOVASCULAR A:  Essential Hypertension Diastolic CHF  P:  Vitals per Unit Protocol Continuous Telemetry Monitoring  RENAL A:   Hypernatremia - Mild. Resolved.  Hypokalemia - Resolved.   P:   Trending UOP with Foley Monitoring renal function & electrolytes daily Replacing electrolytes as  indicated  GASTROINTESTINAL A:   No acute issues.  P:   NPO until patient passes bedside swallow evaluation Holding tube feedings Protonix IV daily  HEMATOLOGIC A:   Anemia - Hgb stable. No signs of active bleeding. Thrombocytopenia - Mild.  P:  Trending cell counts daily w/ CBC SCDs Heparin [place] q8hr  INFECTIOUS A:   No acute infection.  P:   Monitoring for signs of infection off antibiotics  ENDOCRINE A:   No acute issues.  P:   Monitor glucose on daily chemistry panel  NEUROLOGIC A:   Acute Encephalopathy - Multifactorial from hypoxia and hypercarbia. CT Head negative 1/14. Sedation on Ventilator  P:   RASS goal: 0 to -1 Propofol gtt Fentanyl IV prn  FAMILY  - Updates:  Daughter updated by Dr. Ashok Cordia 1/16 at bedside.   - Inter-disciplinary family meet or Palliative Care meeting due by:  1/21  TODAY'S SUMMARY:  66 y.o. female with known history of chronic hypoxic and hypercarbic respiratory failure. Patient's respiratory status has significantly  improved. Neurologically she appears grossly nonfocal. This will need to be reassessed postextubation. Continuing close monitoring pending extubation for possible respiratory decompensation requiring intubation. Plan for family discussion with the patient's children and patient postextubation to address her denial regarding the severity of her illness and possible depression.  I have spent a total of 32 minutes of critical care time today caring for the patient, updating patient's daughter at bedside, and reviewing the patient's electronic medical record.  Sonia Baller Ashok Cordia, M.D. [facility] Pulmonary & Critical Care Pager:  [phone] After 3pm or if no response, call [phone] 09/28/2016, 11:47 AM

## 2016-09-29 LAB — GLUCOSE, CAPILLARY
Glucose-Capillary: 79 mg/dL (ref 65–99)
Glucose-Capillary: 78 mg/dL (ref 65–99)

## 2016-09-29 LAB — CBC WITH DIFFERENTIAL/PLATELET
Basophils Absolute: 0 10*3/uL (ref 0.0–0.1)
Basophils Relative: 0 %
Eosinophils Absolute: 0.1 10*3/uL (ref 0.0–0.7)
Eosinophils Relative: 3 %
HCT: 35 % — ABNORMAL LOW (ref 36.0–46.0)
Hemoglobin: 9.4 g/dL — ABNORMAL LOW (ref 12.0–15.0)
Lymphocytes Relative: 25 %
Lymphs Abs: 1 10*3/uL (ref 0.7–4.0)
MCH: 24.6 pg — ABNORMAL LOW (ref 26.0–34.0)
MCHC: 26.9 g/dL — ABNORMAL LOW (ref 30.0–36.0)
MCV: 91.6 fL (ref 78.0–100.0)
Monocytes Absolute: 0.3 10*3/uL (ref 0.1–1.0)
Monocytes Relative: 8 %
Neutro Abs: 2.6 10*3/uL (ref 1.7–7.7)
Neutrophils Relative %: 64 %
Platelets: 125 10*3/uL — ABNORMAL LOW (ref 150–400)
RBC: 3.82 MIL/uL — ABNORMAL LOW (ref 3.87–5.11)
RDW: 17.3 % — ABNORMAL HIGH (ref 11.5–15.5)
WBC: 4.1 10*3/uL (ref 4.0–10.5)

## 2016-09-29 LAB — RENAL FUNCTION PANEL
Albumin: 2.4 g/dL — ABNORMAL LOW (ref 3.5–5.0)
Anion gap: 7 (ref 5–15)
BUN: 14 mg/dL (ref 6–20)
CO2: 35 mmol/L — ABNORMAL HIGH (ref 22–32)
Calcium: 7.8 mg/dL — ABNORMAL LOW (ref 8.9–10.3)
Chloride: 104 mmol/L (ref 101–111)
Creatinine, Ser: 0.4 mg/dL — ABNORMAL LOW (ref 0.44–1.00)
GFR calc Af Amer: >60 mL/min (ref >60)
GFR calc non Af Amer: >60 mL/min (ref >60)
Glucose, Bld: 82 mg/dL (ref 65–99)
Phosphorus: 3.3 mg/dL (ref 2.5–4.6)
Potassium: 3.3 mmol/L — ABNORMAL LOW (ref 3.5–5.1)
Sodium: 146 mmol/L — ABNORMAL HIGH (ref 135–145)

## 2016-09-29 LAB — MAGNESIUM: Magnesium: 2 mg/dL (ref 1.7–2.4)

## 2016-09-29 MED ORDER — FUROSEMIDE 10 MG/ML IJ SOLN
40.0000 mg | Freq: Four times a day (QID) | INTRAMUSCULAR | Status: AC
  Administered 2016-09-29 – 2016-09-30 (×3): 40 mg via INTRAVENOUS
  Filled 2016-09-29 (×3): qty 4

## 2016-09-29 MED ORDER — POTASSIUM CHLORIDE CRYS ER 20 MEQ PO TBCR
40.0000 meq | EXTENDED_RELEASE_TABLET | Freq: Three times a day (TID) | ORAL | Status: AC
Start: 2016-09-29 — End: 2016-09-29
  Administered 2016-09-29 (×2): 40 meq via ORAL
  Filled 2016-09-29 (×2): qty 2

## 2016-09-29 MED ORDER — PANTOPRAZOLE SODIUM 40 MG PO TBEC
40.0000 mg | DELAYED_RELEASE_TABLET | Freq: Every day | ORAL | Status: DC
  Administered 2016-09-29: 40 mg via ORAL
  Filled 2016-09-29: qty 1

## 2016-09-29 NOTE — Progress Notes (Signed)
PULMONARY / CRITICAL CARE MEDICINE   Name: [name] MRN: [id number] DOB: January 02, 1951    ADMISSION DATE:  09/24/2016  HISTORY OF PRESENT ILLNESS:  66 y.o. female quit smoking 2 y PTA followed for morbid obesity  GOLD II COPD (FEV1 54%) with hx of hypercarbic/hypoxic RF previously on home O2 last in early 2017 . Previous severe exacerbation w/ vent support in 2015  Admit from office with progressive doe x one week with desats into the 70's at rest on RA.  SUBJECTIVE: No acute events overnight. Patient has tolerated SBT on PS 5/5 this morning.   REVIEW OF SYSTEMS: Unable to obtain given intubation.  VITAL SIGNS: BP 127/62   Pulse 81   Temp 98.5 F (36.9 C) (Oral)   Resp (!) 24   Ht 5\' 5"  (1.651 m)   Wt 112.9 kg (248 lb 14.4 oz)   SpO2 100%   BMI 41.42 kg/m   HEMODYNAMICS:    VENTILATOR SETTINGS: Vent Mode: CPAP;PSV FiO2 (%):  [40 %] 40 % PEEP:  [5 cmH20] 5 cmH20 Pressure Support:  [0 cmH20] 0 cmH20  INTAKE / OUTPUT: I/O last 3 completed shifts: In: 1461.9 [P.O.:270; I.V.:353.6; Other:20; NG/GT:818.3] Out: 3790 [Urine:3790]  PHYSICAL EXAMINATION: General: No distress. Daughter at bedside. Awake. Integument: Warm and dry. No rash on exposed skin. Pulmonary: Again diminished breath sounds but symmetric. Normal work of breathing on pressure support 0/5. Tidal volume greater than 1000 mL. Cardiovascular: Regular rate. No JVD. No edema. Abdomen: Soft. Protuberant. Nontender. Neurological: Strength 5/5 and symmetric. Following commands. Moving all 4 extremities equally. Cranial nerves grossly intact.  LABS:  BMET  Recent Labs Lab 09/27/16 0529 09/28/16 0252 09/29/16 0248  NA 145 143 146*  K 4.6 3.9 3.3*  CL 100* 100* 104  CO2 37* 36* 35*  BUN 21* 24* 14  CREATININE 0.64 0.45 0.40*  GLUCOSE 75 88 82    Electrolytes  Recent Labs Lab 09/27/16 0529 09/28/16 0252 09/29/16 0248  CALCIUM 7.7* 7.6* 7.8*  MG 2.0 2.0 2.0  PHOS 3.4 3.6 3.3    CBC  Recent  Labs Lab 09/27/16 0529 09/28/16 0252 09/29/16 0248  WBC 6.8 5.1 4.1  HGB 9.8* 9.5* 9.4*  HCT 36.9 34.9* 35.0*  PLT 171 147* 125*    Coag's  Recent Labs Lab 09/24/16 1938  INR 1.19    Sepsis Markers  Recent Labs Lab 09/24/16 1932  LATICACIDVEN 1.1    ABG  Recent Labs Lab 09/26/16 0601 09/26/16 0927 09/27/16 1206  PHART 7.187* 7.254* 7.360  PCO2ART 117* 93.7* 72.8*  PO2ART 54.7* 59.0* 84.4    Liver Enzymes  Recent Labs Lab 09/24/16 2001 09/25/16 0227 09/28/16 0252 09/29/16 0248  AST 21  --   --   --   ALT 14  --   --   --   ALKPHOS 80  --   --   --   BILITOT 0.7  --   --   --   ALBUMIN 2.8* 2.7* 2.4* 2.4*    Cardiac Enzymes  Recent Labs Lab 09/24/16 2001 09/25/16 0227 09/25/16 0548  TROPONINI 0.03* <0.03 <0.03    Glucose  Recent Labs Lab 09/28/16 1144 09/28/16 1756 09/28/16 1933 09/28/16 2334 09/29/16 0345 09/29/16 0736  GLUCAP 89 70 84 77 79 78    Imaging No results found.   STUDIES:  PFT 01/03/14: FVC 2.05 L (69%) FEV1 0.85 L (37%) FEV1/FVC 0.42 FEF 25-75 0.36 L (78%) positive bronchodilator response TLC 6.17 L (127%) RV 195% DLCO  uncorrected 48% TTE 09/25/16:  Mild concentric hypertrophy with EF 65-70%. No regional wall motion abnormalities grade 1 diastolic dysfunction. LA moderately dilated & RA mildly dilated. RV normal in size and function. Mild aortic stenosis without regurgitation. Aortic root normal in size. No mitral stenosis or regurgitation. Trivial pulmonic regurgitation without stenosis. Moderate tricuspid regurgitation. No pericardial effusion. CTA CHEST 09/25/16: IMPRESSION: 1. No evidence for pulmonary embolus. 2. Cardiomegaly and a small pericardial effusion are stable without edema to suggest failure. 3. Coronary artery disease. 4. Aortic atherosclerosis. 5. Centrilobular emphysema. CT HEAD W/O 09/26/16: IMPRESSION: 1. Chronic microvascular ischemia without acute intracranial abnormality. 2. ASPECTS is  10.  MICROBIOLOGY: MRSA PCR 1/14:  Negative   ANTIBIOTICS: None  SIGNIFICANT EVENTS: 1/12 - Admit 1/14 - Transfer to ICU with altered mental status & worsening respiratory failure - Code Stroke called on the floor  LINES/TUBES: OETT 7.5 1/14 >> FOLEY 1/14 >> OGT 1/14 >> PIV x2  I reviewed CXR myself, pulmonary edema noted  ASSESSMENT / PLAN:  PULMONARY A: Acute on Chronic Hypoxic Respiratory Failure - Multifactorial. Likely some element of decompensated CHF. Acute on Chronic Hypercarbic Respiratory Failure Severe COPD w/ Emphysema Tobacco Use Disorder  P:   D/C BiPAP, continue HFNC Targeting saturation 88-92% to prevent V/Q mismatching Pulmicort 0.25mg  Neb BID Duoneb q6hr Scheduled Holding Dulera on pulmicort Nicotine Patch 14mg /24 hr Active diureses today  CARDIOVASCULAR A:  Essential Hypertension Diastolic CHF  P:  Vitals per Unit Protocol Continuous Telemetry Monitoring  RENAL A:   Hypernatremia - Mild. Resolved.  Hypokalemia - Resolved.   P:   Trending UOP with Foley Monitoring renal function & electrolytes daily Replacing electrolytes as indicated Lasix 40 mg IV q6 x3 doses  GASTROINTESTINAL A:   No acute issues.  P:   Heart healthy diet Protonix IV daily  HEMATOLOGIC A:   Anemia - Hgb stable. No signs of active bleeding. Thrombocytopenia - Mild.  P:  Trending cell counts daily w/ CBC SCDs Heparin [place] q8hr  INFECTIOUS A:   No acute infection.  P:   Monitoring for signs of infection off antibiotics  ENDOCRINE A:   No acute issues.  P:   Monitor glucose on daily chemistry panel  NEUROLOGIC A:   Acute Encephalopathy - Multifactorial from hypoxia and hypercarbia. CT Head negative 1/14. Sedation on Ventilator  P:   RASS goal: 0 to -1 Propofol gtt Fentanyl IV prn  FAMILY  - Updates: patient updated bedside, patient will call family and update them.  Hold in the ICU overnight given high O2 demand.  -  Inter-disciplinary family meet or Palliative Care meeting due by:  1/21  The patient is critically ill with multiple organ systems failure and requires high complexity decision making for assessment and support, frequent evaluation and titration of therapies, application of advanced monitoring technologies and extensive interpretation of multiple databases.   Critical Care Time devoted to patient care services described in this note is  35  Minutes. This time reflects time of care of this signee Dr Jennet Maduro. This critical care time does not reflect procedure time, or teaching time or supervisory time of PA/NP/Med student/Med Resident etc but could involve care discussion time.  Rush Farmer, M.D. [facility] Pulmonary/Critical Care Medicine. Pager: [phone]. After hours pager: [phone].

## 2016-09-29 NOTE — Evaluation (Signed)
Physical Therapy Evaluation Patient Details Name: [name] MRN: [id number] DOB: April 03, 1951 Today's Date: 09/29/2016   History of Present Illness  66 yo female quit smoking 2 y PTA followed for morbid obesity  GOLD II COPD (FEV1 54%) with hx of hypercarbic/hypoxic RF previously on home O2 last in early 2017 . Previous severe exacerbation w/ vent support in 2015  Admit from office with progressive doe x one week with desats into the 70's at rest on RA  Clinical Impression  Pt admitted with above diagnosis. Pt currently with functional limitations due to the deficits listed below (see PT Problem List). Pt hopeful to go home and may improve to that point, but currently her tolerance for activity is very poor and would recommend SNF stay for rehab before home. Pt ambulated 69' x2 on 10L O2 with O2 desat to 79% and need for +2 min A.  Pt will benefit from skilled PT to increase their independence and safety with mobility to allow discharge to the venue listed below.       Follow Up Recommendations SNF    Equipment Recommendations  Rolling walker with 5" wheels    Recommendations for Other Services OT consult     Precautions / Restrictions Precautions Precautions: Fall Restrictions Weight Bearing Restrictions: No      Mobility  Bed Mobility               General bed mobility comments: pt received in recliner  Transfers Overall transfer level: Needs assistance Equipment used: Rolling walker (2 wheeled) Transfers: Sit to/from Stand Sit to Stand: Mod assist;+2 physical assistance         General transfer comment: pt with posterior lean when trying to stand as well as upon initial standing when she lost balance bkwd and had to immediately sit back to chair  Ambulation/Gait Ambulation/Gait assistance: Min assist;+2 safety/equipment;+2 physical assistance Ambulation Distance (Feet): 150 Feet (75' x2) Assistive device: Rolling walker (2 wheeled) Gait Pattern/deviations:  Step-through pattern;Trunk flexed Gait velocity: decreased Gait velocity interpretation: <1.8 ft/sec, indicative of risk for recurrent falls General Gait Details: pt ambulated on 10L O2 and remained above 90% first 75', then dropped to 87%. Encouraged pt to take a seated rest break until O2 sats returned to 90% but second 75' O2 sats quickly dropped below 90% and pt finished ambulation on 79%. vc's for erect posture. Fatigue and knee pain noted second half of ambulation  Stairs            Wheelchair Mobility    Modified Rankin (Stroke Patients Only)       Balance Overall balance assessment: Needs assistance Sitting-balance support: No upper extremity supported Sitting balance-Leahy Scale: Good     Standing balance support: Bilateral upper extremity supported Standing balance-Leahy Scale: Poor Standing balance comment: reliant on RW                             Pertinent Vitals/Pain Pain Assessment: Faces Faces Pain Scale: Hurts little more Pain Location: knee Pain Descriptors / Indicators: Aching Pain Intervention(s): Monitored during session    Home Living Family/patient expects to be discharged to:: Private residence Living Arrangements: Parent;Other relatives Available Help at Discharge: Family;Available 24 hours/day Type of Home: House Home Access: Level entry     Home Layout: One level Home Equipment: Cane - single point Additional Comments: pt lives with mother and brother and keeps her grandchildren several days a week (80 yo).  Prior Function Level of Independence: Independent         Comments: occasionally uses cane due to knee OA     Hand Dominance        Extremity/Trunk Assessment   Upper Extremity Assessment Upper Extremity Assessment: Generalized weakness    Lower Extremity Assessment Lower Extremity Assessment: Generalized weakness;RLE deficits/detail RLE Deficits / Details: knee ext 3/5, begins to buckle with increased  ambulation distance    Cervical / Trunk Assessment Cervical / Trunk Assessment: Normal  Communication   Communication: No difficulties  Cognition Arousal/Alertness: Awake/alert Behavior During Therapy: Impulsive Overall Cognitive Status: Impaired/Different from baseline Area of Impairment: Following commands;Safety/judgement;Problem solving       Following Commands: Follows one step commands consistently;Follows multi-step commands inconsistently Safety/Judgement: Decreased awareness of safety   Problem Solving: Difficulty sequencing;Requires verbal cues General Comments: pt not following instructions well, kept trying to stand after being asked to remain seated while lines prepared.     General Comments General comments (skin integrity, edema, etc.): On RA, pt dropped to 62% within 5 mins, 8L O2 reapplied and pt returned to 95%    Exercises     Assessment/Plan    PT Assessment Patient needs continued PT services  PT Problem List Decreased strength;Decreased activity tolerance;Decreased balance;Decreased mobility;Decreased knowledge of use of DME;Decreased knowledge of precautions;Obesity;Cardiopulmonary status limiting activity          PT Treatment Interventions DME instruction;Gait training;Functional mobility training;Therapeutic activities;Therapeutic exercise;Balance training;Patient/family education;Cognitive remediation    PT Goals (Current goals can be found in the Care Plan section)  Acute Rehab PT Goals Patient Stated Goal: return home PT Goal Formulation: With patient Time For Goal Achievement: 10/13/16 Potential to Achieve Goals: Fair    Frequency Min 3X/week   Barriers to discharge Decreased caregiver support pt sometimes needs to help mother. Unsure how much help brother gives.     Co-evaluation               End of Session Equipment Utilized During Treatment: Gait belt;Oxygen Activity Tolerance: Patient limited by fatigue;Treatment limited  secondary to medical complications (Comment) (O2 sats) Patient left: in chair;with call bell/phone within reach Nurse Communication: Mobility status         Time: [id number] PT Time Calculation (min) (ACUTE ONLY): 35 min   Charges:   PT Evaluation $PT Eval Moderate Complexity: 1 Procedure PT Treatments $Gait Training: 8-22 mins   PT G Codes:      Leighton Roach, PT  Acute Rehab Services  [place] 09/29/2016, 1:59 PM

## 2016-09-30 ENCOUNTER — Inpatient Hospital Stay (HOSPITAL_COMMUNITY): Payer: Medicare Other

## 2016-09-30 DIAGNOSIS — J432 Centrilobular emphysema: Secondary | ICD-10-CM

## 2016-09-30 LAB — CBC WITH DIFFERENTIAL/PLATELET
Basophils Absolute: 0 10*3/uL (ref 0.0–0.1)
Basophils Relative: 0 %
Eosinophils Absolute: 0.2 10*3/uL (ref 0.0–0.7)
Eosinophils Relative: 4 %
HCT: 39.5 % (ref 36.0–46.0)
Hemoglobin: 10.4 g/dL — ABNORMAL LOW (ref 12.0–15.0)
Lymphocytes Relative: 23 %
Lymphs Abs: 1 10*3/uL (ref 0.7–4.0)
MCH: 24.3 pg — ABNORMAL LOW (ref 26.0–34.0)
MCHC: 26.3 g/dL — ABNORMAL LOW (ref 30.0–36.0)
MCV: 92.3 fL (ref 78.0–100.0)
Monocytes Absolute: 0.4 10*3/uL (ref 0.1–1.0)
Monocytes Relative: 9 %
Neutro Abs: 3 10*3/uL (ref 1.7–7.7)
Neutrophils Relative %: 64 %
Platelets: 137 10*3/uL — ABNORMAL LOW (ref 150–400)
RBC: 4.28 MIL/uL (ref 3.87–5.11)
RDW: 17.1 % — ABNORMAL HIGH (ref 11.5–15.5)
WBC: 4.6 10*3/uL (ref 4.0–10.5)

## 2016-09-30 LAB — GLUCOSE, CAPILLARY: Glucose-Capillary: 88 mg/dL (ref 65–99)

## 2016-09-30 LAB — RENAL FUNCTION PANEL
Albumin: 2.7 g/dL — ABNORMAL LOW (ref 3.5–5.0)
Anion gap: 10 (ref 5–15)
BUN: 14 mg/dL (ref 6–20)
CO2: 41 mmol/L — ABNORMAL HIGH (ref 22–32)
Calcium: 8.3 mg/dL — ABNORMAL LOW (ref 8.9–10.3)
Chloride: 97 mmol/L — ABNORMAL LOW (ref 101–111)
Creatinine, Ser: 0.51 mg/dL (ref 0.44–1.00)
GFR calc Af Amer: >60 mL/min (ref >60)
GFR calc non Af Amer: >60 mL/min (ref >60)
Glucose, Bld: 86 mg/dL (ref 65–99)
Phosphorus: 3.8 mg/dL (ref 2.5–4.6)
Potassium: 4 mmol/L (ref 3.5–5.1)
Sodium: 148 mmol/L — ABNORMAL HIGH (ref 135–145)

## 2016-09-30 LAB — MAGNESIUM: Magnesium: 1.9 mg/dL (ref 1.7–2.4)

## 2016-09-30 MED ORDER — ARFORMOTEROL TARTRATE 15 MCG/2ML IN NEBU
15.0000 ug | INHALATION_SOLUTION | Freq: Two times a day (BID) | RESPIRATORY_TRACT | Status: DC
  Administered 2016-09-30 – 2016-10-03 (×8): 15 ug via RESPIRATORY_TRACT
  Filled 2016-09-30 (×9): qty 2

## 2016-09-30 MED ORDER — ASPIRIN 81 MG PO CHEW
81.0000 mg | CHEWABLE_TABLET | Freq: Every day | ORAL | Status: DC
  Administered 2016-09-30 – 2016-10-04 (×5): 81 mg via ORAL
  Filled 2016-09-30 (×5): qty 1

## 2016-09-30 MED ORDER — NICOTINE 7 MG/24HR TD PT24
7.0000 mg | MEDICATED_PATCH | Freq: Every day | TRANSDERMAL | Status: DC
  Administered 2016-10-01 – 2016-10-04 (×4): 7 mg via TRANSDERMAL
  Filled 2016-09-30 (×4): qty 1

## 2016-09-30 MED ORDER — BUDESONIDE 0.5 MG/2ML IN SUSP
0.5000 mg | Freq: Two times a day (BID) | RESPIRATORY_TRACT | Status: DC
  Administered 2016-09-30 – 2016-10-03 (×7): 0.5 mg via RESPIRATORY_TRACT
  Filled 2016-09-30 (×9): qty 2

## 2016-09-30 MED ORDER — UMECLIDINIUM BROMIDE 62.5 MCG/INH IN AEPB
1.0000 | INHALATION_SPRAY | Freq: Every day | RESPIRATORY_TRACT | Status: DC
  Administered 2016-09-30: 1 via RESPIRATORY_TRACT
  Filled 2016-09-30: qty 7

## 2016-09-30 MED ORDER — ACETYLCYSTEINE 20 % IN SOLN
3.0000 mL | Freq: Two times a day (BID) | RESPIRATORY_TRACT | Status: DC
Start: 2016-09-30 — End: 2016-09-30
  Administered 2016-09-30: 3 mL via RESPIRATORY_TRACT
  Filled 2016-09-30: qty 4

## 2016-09-30 MED ORDER — FUROSEMIDE 10 MG/ML IJ SOLN
40.0000 mg | Freq: Four times a day (QID) | INTRAMUSCULAR | Status: AC
  Administered 2016-09-30 (×3): 40 mg via INTRAVENOUS
  Filled 2016-09-30 (×3): qty 4

## 2016-09-30 MED ORDER — GUAIFENESIN ER 600 MG PO TB12
1200.0000 mg | ORAL_TABLET | Freq: Two times a day (BID) | ORAL | Status: DC
  Administered 2016-09-30 – 2016-10-04 (×8): 1200 mg via ORAL
  Filled 2016-09-30 (×8): qty 2

## 2016-09-30 NOTE — Progress Notes (Signed)
PCCM Progress Note  Subjective: Still has cough.  She doesn't believe she has emphysema.    Vital signs: BP (!) 100/56   Pulse 83   Temp 98 F (36.7 C) (Oral)   Resp 16   Ht 5\' 5"  (1.651 m)   Wt 240 lb 8.4 oz (109.1 kg)   SpO2 90%   BMI 40.02 kg/m   Intake/outpt: I/O last 3 completed shifts: In: [id number] [P.O.:1150; I.V.:3] Out: 7680 [Urine:7680]  General: sitting up in bed Neuro: anxious Eyes: wears glasses ENT: no stridor Cardiac: regular, no murmur Chest: decreased BS, no wheeze Abd: soft, non tender Ext: 1+ edema Skin: no rashes  CMP Latest Ref Rng & Units 09/30/2016 09/29/2016 09/28/2016  Glucose 65 - 99 mg/dL 86 82 88  BUN 6 - 20 mg/dL 14 14 24(H)  Creatinine 0.44 - 1.00 mg/dL 0.51 0.40(L) 0.45  Sodium 135 - 145 mmol/L 148(H) 146(H) 143  Potassium 3.5 - 5.1 mmol/L 4.0 3.3(L) 3.9  Chloride 101 - 111 mmol/L 97(L) 104 100(L)  CO2 22 - 32 mmol/L 41(H) 35(H) 36(H)  Calcium 8.9 - 10.3 mg/dL 8.3(L) 7.8(L) 7.6(L)  Total Protein 6.5 - 8.1 g/dL - - -  Total Bilirubin 0.3 - 1.2 mg/dL - - -  Alkaline Phos 38 - 126 U/L - - -  AST 15 - 41 U/L - - -  ALT 14 - 54 U/L - - -    CBC Latest Ref Rng & Units 09/30/2016 09/29/2016 09/28/2016  WBC 4.0 - 10.5 K/uL 4.6 4.1 5.1  Hemoglobin 12.0 - 15.0 g/dL 10.4(L) 9.4(L) 9.5(L)  Hematocrit 36.0 - 46.0 % 39.5 35.0(L) 34.9(L)  Platelets 150 - 400 K/uL 137(L) 125(L) 147(L)    ABG    Component Value Date/Time   PHART 7.360 09/27/2016 1206   PCO2ART 72.8 (HH) 09/27/2016 1206   PO2ART 84.4 09/27/2016 1206   HCO3 40.1 (H) 09/27/2016 1206   TCO2 44 09/26/2016 0927   O2SAT 95.9 09/27/2016 1206    CBG (last 3)   Recent Labs  09/29/16 0345 09/29/16 0736 09/30/16 0920  GLUCAP 79 78 88     Imaging: Dg Chest Port 1 View  Addendum Date: 09/30/2016   ADDENDUM REPORT: 09/30/2016 07:17 ADDENDUM: The impression above should read: 1. Interval removal of endotracheal tube and NG tube. No pneumothorax. 2. Cardiomegaly with pulmonary  vascular prominence and mild basilar interstitial prominence suggesting mild CHF. 3. Basilar atelectasis. Stable elevation left hemidiaphragm. Electronically Signed   By: Marcello Moores  Register   On: 09/30/2016 07:17   Result Date: 09/30/2016 CLINICAL DATA:  Respiratory distress. EXAM: PORTABLE CHEST 1 VIEW COMPARISON:  09/27/2016. FINDINGS: Mediastinum hilar structures are normal. Cardiomegaly with pulmonary vascular prominence and mild interstitial prominence suggesting mild CHF. Mild basilar atelectasis. Stable elevation left hemidiaphragm. Surgical clips left axilla. IMPRESSION: 1. Lines and tubes in stable position. 2. Cardiomegaly with pulmonary vascular prominence and mild basilar interstitial prominence suggesting mild CHF. 3.  Basilar atelectasis.  Stable elevation left hemidiaphragm . Electronically Signed: ByMarcello Moores  Register On: 09/30/2016 07:12    Studies:  PFT 01/03/14 >> FEV1 0.85 (37%), FEV1% 42, TLC 6.17 (127%), DLCO 48%, +BD Echo 09/25/16 >> EF 65 to 70%, grade 1 DD CT chest 09/25/16 >> CM with small pericardial effusion, atherosclerosis, centrilobular emphysema CT head 09/26/16 >> microvascular changes  Lines/tubes: ETT 1/14 >> 1/16  Summary: 66 yo female smoker with progressive dyspnea, hypoxia from AECOPD with emphysema  She is followed by Dr. Lake Bells in pulmonary office.  Assessment/plan:  Acute on chronic hypoxic/hypercapnic respiratory failure from AECOPD. Elevated Lt hemidiaphragm. - pulmicort - add brovana, incruse 1/18  - continue schedule duoneb for now - nicotine patch  Hx of HTN with chronic diastolic CHF. Atherosclerosis on CT chest. - continue lasix - add ASA 81 mg daily - might need further cardiology assessment as outpt  Anemia of critical illness and chronic disease. - f/u CBC  Deconditioning. - PT recommending SNF  DVT prophylaxis - SQ heparin SUP - not indicated Nutrition - Heart healthy diet Goals of care - Full code  Transfer to SDU  1/18.  Updated pt's daughter at bedside  Time spent 38 minutes.  Chesley Mires, MD [facility] Pulmonary/Critical Care 09/30/2016, 12:27 PM Pager:  [phone] After 3pm call: [phone]

## 2016-09-30 NOTE — Progress Notes (Signed)
RT instructed family and pt on the use of flutter valve.  Pt able to use the flutter valve well.

## 2016-09-30 NOTE — Progress Notes (Signed)
PULMONARY / CRITICAL CARE MEDICINE   Name: [name] MRN: [id number] DOB: Oct 31, 1950    ADMISSION DATE:  09/24/2016  HISTORY OF PRESENT ILLNESS:  66 y.o. female quit smoking 2 y PTA followed for morbid obesity  GOLD II COPD (FEV1 54%) with hx of hypercarbic/hypoxic RF previously on home O2 last in early 2017 . Previous severe exacerbation w/ vent support in 2015  Admit from office with progressive doe x one week with desats into the 70's at rest on RA.  SUBJECTIVE: No acute events overnight. Patient has tolerated SBT on PS 5/5 this morning.   REVIEW OF SYSTEMS: Unable to obtain given intubation.  VITAL SIGNS: BP 108/61 (BP Location: Right Arm)   Pulse 84   Temp 98 F (36.7 C) (Oral)   Resp (!) 23   Ht 5\' 5"  (1.651 m)   Wt 240 lb 8.4 oz (109.1 kg)   SpO2 100%   BMI 40.02 kg/m   HEMODYNAMICS:    VENTILATOR SETTINGS:    INTAKE / OUTPUT: I/O last 3 completed shifts: In: [id number] [P.O.:1150; I.V.:3] Out: 7680 [Urine:7680]  PHYSICAL EXAMINATION: General: No distress. Daughter at bedside. Awake. Integument: Warm and dry. No rash on exposed skin. Pulmonary: Again diminished breath sounds but symmetric. Normal work of breathing on pressure support 0/5. Tidal volume greater than 1000 mL. Cardiovascular: Regular rate. No JVD. No edema. Abdomen: Soft. Protuberant. Nontender. Neurological: Strength 5/5 and symmetric. Following commands. Moving all 4 extremities equally. Cranial nerves grossly intact.  LABS:  BMET  Recent Labs Lab 09/28/16 0252 09/29/16 0248 09/30/16 0433  NA 143 146* 148*  K 3.9 3.3* 4.0  CL 100* 104 97*  CO2 36* 35* 41*  BUN 24* 14 14  CREATININE 0.45 0.40* 0.51  GLUCOSE 88 82 86    Electrolytes  Recent Labs Lab 09/28/16 0252 09/29/16 0248 09/30/16 0433  CALCIUM 7.6* 7.8* 8.3*  MG 2.0 2.0 1.9  PHOS 3.6 3.3 3.8    CBC  Recent Labs Lab 09/28/16 0252 09/29/16 0248 09/30/16 0433  WBC 5.1 4.1 4.6  HGB 9.5* 9.4* 10.4*  HCT 34.9* 35.0*  39.5  PLT 147* 125* 137*    Coag's  Recent Labs Lab 09/24/16 1938  INR 1.19    Sepsis Markers  Recent Labs Lab 09/24/16 1932  LATICACIDVEN 1.1    ABG  Recent Labs Lab 09/26/16 0601 09/26/16 0927 09/27/16 1206  PHART 7.187* 7.254* 7.360  PCO2ART 117* 93.7* 72.8*  PO2ART 54.7* 59.0* 84.4    Liver Enzymes  Recent Labs Lab 09/24/16 2001  09/28/16 0252 09/29/16 0248 09/30/16 0433  AST 21  --   --   --   --   ALT 14  --   --   --   --   ALKPHOS 80  --   --   --   --   BILITOT 0.7  --   --   --   --   ALBUMIN 2.8*  < > 2.4* 2.4* 2.7*  < > = values in this interval not displayed.  Cardiac Enzymes  Recent Labs Lab 09/24/16 2001 09/25/16 0227 09/25/16 0548  TROPONINI 0.03* <0.03 <0.03    Glucose  Recent Labs Lab 09/28/16 1756 09/28/16 1933 09/28/16 2334 09/29/16 0345 09/29/16 0736 09/30/16 0920  GLUCAP 70 84 77 79 78 88    Imaging Dg Chest Port 1 View  Addendum Date: 09/30/2016   ADDENDUM REPORT: 09/30/2016 07:17 ADDENDUM: The impression above should read: 1. Interval removal of endotracheal tube and NG  tube. No pneumothorax. 2. Cardiomegaly with pulmonary vascular prominence and mild basilar interstitial prominence suggesting mild CHF. 3. Basilar atelectasis. Stable elevation left hemidiaphragm. Electronically Signed   By: Marcello Moores  Register   On: 09/30/2016 07:17   Result Date: 09/30/2016 CLINICAL DATA:  Respiratory distress. EXAM: PORTABLE CHEST 1 VIEW COMPARISON:  09/27/2016. FINDINGS: Mediastinum hilar structures are normal. Cardiomegaly with pulmonary vascular prominence and mild interstitial prominence suggesting mild CHF. Mild basilar atelectasis. Stable elevation left hemidiaphragm. Surgical clips left axilla. IMPRESSION: 1. Lines and tubes in stable position. 2. Cardiomegaly with pulmonary vascular prominence and mild basilar interstitial prominence suggesting mild CHF. 3.  Basilar atelectasis.  Stable elevation left hemidiaphragm .  Electronically Signed: ByMarcello Moores  Register On: 09/30/2016 07:12     STUDIES:  PFT 01/03/14: FVC 2.05 L (69%) FEV1 0.85 L (37%) FEV1/FVC 0.42 FEF 25-75 0.36 L (78%) positive bronchodilator response TLC 6.17 L (127%) RV 195% DLCO uncorrected 48% TTE 09/25/16:  Mild concentric hypertrophy with EF 65-70%. No regional wall motion abnormalities grade 1 diastolic dysfunction. LA moderately dilated & RA mildly dilated. RV normal in size and function. Mild aortic stenosis without regurgitation. Aortic root normal in size. No mitral stenosis or regurgitation. Trivial pulmonic regurgitation without stenosis. Moderate tricuspid regurgitation. No pericardial effusion. CTA CHEST 09/25/16: IMPRESSION: 1. No evidence for pulmonary embolus. 2. Cardiomegaly and a small pericardial effusion are stable without edema to suggest failure. 3. Coronary artery disease. 4. Aortic atherosclerosis. 5. Centrilobular emphysema. CT HEAD W/O 09/26/16: IMPRESSION: 1. Chronic microvascular ischemia without acute intracranial abnormality. 2. ASPECTS is 10.  MICROBIOLOGY: MRSA PCR 1/14:  Negative   ANTIBIOTICS: None  SIGNIFICANT EVENTS: 1/12 - Admit 1/14 - Transfer to ICU with altered mental status & worsening respiratory failure - Code Stroke called on the floor  LINES/TUBES: OETT 7.5 1/14 >> FOLEY 1/14 >> OGT 1/14 >> PIV x2  I reviewed CXR myself, pulmonary edema noted  ASSESSMENT / PLAN:  PULMONARY A: Acute on Chronic Hypoxic Respiratory Failure - Multifactorial. Likely some element of decompensated CHF. Acute on Chronic Hypercarbic Respiratory Failure Severe COPD w/ Emphysema Tobacco Use Disorder  P:   D/C BiPAP, continue ventri mask Targeting saturation 88-92% to prevent V/Q mismatching Pulmicort 0.25mg  Neb BID Duoneb q6hr Scheduled Holding Dulera on pulmicort Nicotine Patch 7mg /24 hr Active diureses 1/18 Add flutter valve 1/18 Add chest percussion 1/18 Repeat lasix 1/18  CARDIOVASCULAR A:   Essential Hypertension Diastolic CHF  P:  Vitals per Unit Protocol Continuous Telemetry Monitoring  RENAL  Recent Labs Lab 09/28/16 0252 09/29/16 0248 09/30/16 0433  NA 143 146* 148*   A:   Hypernatremia - Mild. Resolved.  Hypokalemia - Resolved.   P:   Trending UOP with Foley Monitoring renal function & electrolytes daily Replacing electrolytes as indicated Lasix 40 mg IV q6 x3 doses 1/18  GASTROINTESTINAL A:   No acute issues.  P:   Heart healthy diet Protonix IV daily, change to po 1/18  HEMATOLOGIC  Recent Labs  09/29/16 0248 09/30/16 0433  HGB 9.4* 10.4*    A:   Anemia - Hgb stable. No signs of active bleeding. Thrombocytopenia - Mild.  P:  Trending cell counts daily w/ CBC SCDs Heparin [place] q8hr  INFECTIOUS A:   No acute infection.  P:   Monitoring for signs of infection off antibiotics  ENDOCRINE A:   No acute issues.  P:   Monitor glucose on daily chemistry panel  NEUROLOGIC A:   Acute Encephalopathy - Multifactorial from hypoxia and hypercarbia.  CT Head negative 1/14.   P:   RASS goal: 0 Off sedation  FAMILY  - Updates: patient updated bedside,She continues to find rationales to continue to smoke. Extended time spent with her. Addressed code status and she wants to remain a full code.   Richardson Landry Martel Galvan ACNP [facility] PCCM Pager [phone] till 3 pm If no answer page [phone] 09/30/2016, 10:21 AM

## 2016-09-30 NOTE — Progress Notes (Signed)
RT attempted CPT through bed with pt. Bed CPT did not work.  RN notified, new bed will be brought in.

## 2016-09-30 NOTE — Progress Notes (Signed)
CPT done with flutter valve since pt is currently in the chair.  New bed in room for next CPT treatment.

## 2016-09-30 NOTE — Plan of Care (Signed)
Problem: Activity: Goal: Risk for activity intolerance will decrease Outcome: Progressing Patient ambulated 236ft today with day shift RN.

## 2016-10-01 ENCOUNTER — Telehealth: Payer: Self-pay | Admitting: Pulmonary Disease

## 2016-10-01 LAB — BASIC METABOLIC PANEL
Anion gap: 10 (ref 5–15)
BUN: 25 mg/dL — ABNORMAL HIGH (ref 6–20)
CO2: 43 mmol/L — ABNORMAL HIGH (ref 22–32)
Calcium: 8.3 mg/dL — ABNORMAL LOW (ref 8.9–10.3)
Chloride: 92 mmol/L — ABNORMAL LOW (ref 101–111)
Creatinine, Ser: 0.74 mg/dL (ref 0.44–1.00)
GFR calc Af Amer: >60 mL/min (ref >60)
GFR calc non Af Amer: >60 mL/min (ref >60)
Glucose, Bld: 126 mg/dL — ABNORMAL HIGH (ref 65–99)
Potassium: 3.6 mmol/L (ref 3.5–5.1)
Sodium: 145 mmol/L (ref 135–145)

## 2016-10-01 LAB — CBC
HCT: 39.7 % (ref 36.0–46.0)
Hemoglobin: 10.6 g/dL — ABNORMAL LOW (ref 12.0–15.0)
MCH: 24.3 pg — ABNORMAL LOW (ref 26.0–34.0)
MCHC: 26.7 g/dL — ABNORMAL LOW (ref 30.0–36.0)
MCV: 90.8 fL (ref 78.0–100.0)
Platelets: 141 10*3/uL — ABNORMAL LOW (ref 150–400)
RBC: 4.37 MIL/uL (ref 3.87–5.11)
RDW: 16.8 % — ABNORMAL HIGH (ref 11.5–15.5)
WBC: 5.3 10*3/uL (ref 4.0–10.5)

## 2016-10-01 LAB — GLUCOSE, CAPILLARY: Glucose-Capillary: 108 mg/dL — ABNORMAL HIGH (ref 65–99)

## 2016-10-01 LAB — PHOSPHORUS: Phosphorus: 4.7 mg/dL — ABNORMAL HIGH (ref 2.5–4.6)

## 2016-10-01 LAB — MAGNESIUM: Magnesium: 2.3 mg/dL (ref 1.7–2.4)

## 2016-10-01 MED ORDER — KETOROLAC TROMETHAMINE 10 MG PO TABS
10.0000 mg | ORAL_TABLET | Freq: Once | ORAL | Status: AC
  Administered 2016-10-01: 10 mg via ORAL
  Filled 2016-10-01: qty 1

## 2016-10-01 MED ORDER — POTASSIUM CHLORIDE CRYS ER 20 MEQ PO TBCR
40.0000 meq | EXTENDED_RELEASE_TABLET | Freq: Three times a day (TID) | ORAL | Status: AC
  Administered 2016-10-01 (×2): 40 meq via ORAL
  Filled 2016-10-01 (×2): qty 2

## 2016-10-01 MED ORDER — FUROSEMIDE 10 MG/ML IJ SOLN
40.0000 mg | Freq: Three times a day (TID) | INTRAMUSCULAR | Status: AC
  Administered 2016-10-01 (×2): 40 mg via INTRAVENOUS
  Filled 2016-10-01 (×2): qty 4

## 2016-10-01 NOTE — Progress Notes (Addendum)
PCCM Progress Note  Subjective: Still has cough.  She doesn't believe she has emphysema.    Vital signs: BP 119/75 (BP Location: Right Arm)   Pulse 85   Temp 98.1 F (36.7 C) (Oral)   Resp (!) 24   Ht 5\' 5"  (1.651 m)   Wt 108.3 kg (238 lb 12.1 oz)   SpO2 96%   BMI 39.73 kg/m   Intake/outpt: I/O last 3 completed shifts: In: 940 [P.O.:940] Out: 4685 [Urine:4685]  General: sitting up in bed Neuro: anxious Eyes: wears glasses ENT: no stridor Cardiac: regular, no murmur Chest: decreased BS, no wheeze Abd: soft, non tender Ext: 1+ edema Skin: no rashes  CMP Latest Ref Rng & Units 10/01/2016 09/30/2016 09/29/2016  Glucose 65 - 99 mg/dL 126(H) 86 82  BUN 6 - 20 mg/dL 25(H) 14 14  Creatinine 0.44 - 1.00 mg/dL 0.74 0.51 0.40(L)  Sodium 135 - 145 mmol/L 145 148(H) 146(H)  Potassium 3.5 - 5.1 mmol/L 3.6 4.0 3.3(L)  Chloride 101 - 111 mmol/L 92(L) 97(L) 104  CO2 22 - 32 mmol/L 43(H) 41(H) 35(H)  Calcium 8.9 - 10.3 mg/dL 8.3(L) 8.3(L) 7.8(L)  Total Protein 6.5 - 8.1 g/dL - - -  Total Bilirubin 0.3 - 1.2 mg/dL - - -  Alkaline Phos 38 - 126 U/L - - -  AST 15 - 41 U/L - - -  ALT 14 - 54 U/L - - -    CBC Latest Ref Rng & Units 10/01/2016 09/30/2016 09/29/2016  WBC 4.0 - 10.5 K/uL 5.3 4.6 4.1  Hemoglobin 12.0 - 15.0 g/dL 10.6(L) 10.4(L) 9.4(L)  Hematocrit 36.0 - 46.0 % 39.7 39.5 35.0(L)  Platelets 150 - 400 K/uL 141(L) 137(L) 125(L)    ABG    Component Value Date/Time   PHART 7.360 09/27/2016 1206   PCO2ART 72.8 (HH) 09/27/2016 1206   PO2ART 84.4 09/27/2016 1206   HCO3 40.1 (H) 09/27/2016 1206   TCO2 44 09/26/2016 0927   O2SAT 95.9 09/27/2016 1206    CBG (last 3)   Recent Labs  09/29/16 0736 09/30/16 0920 10/01/16 0752  GLUCAP 78 88 108*     Imaging: Dg Chest Port 1 View  Addendum Date: 09/30/2016   ADDENDUM REPORT: 09/30/2016 07:17 ADDENDUM: The impression above should read: 1. Interval removal of endotracheal tube and NG tube. No pneumothorax. 2. Cardiomegaly  with pulmonary vascular prominence and mild basilar interstitial prominence suggesting mild CHF. 3. Basilar atelectasis. Stable elevation left hemidiaphragm. Electronically Signed   By: Marcello Moores  Register   On: 09/30/2016 07:17   Result Date: 09/30/2016 CLINICAL DATA:  Respiratory distress. EXAM: PORTABLE CHEST 1 VIEW COMPARISON:  09/27/2016. FINDINGS: Mediastinum hilar structures are normal. Cardiomegaly with pulmonary vascular prominence and mild interstitial prominence suggesting mild CHF. Mild basilar atelectasis. Stable elevation left hemidiaphragm. Surgical clips left axilla. IMPRESSION: 1. Lines and tubes in stable position. 2. Cardiomegaly with pulmonary vascular prominence and mild basilar interstitial prominence suggesting mild CHF. 3.  Basilar atelectasis.  Stable elevation left hemidiaphragm . Electronically Signed: ByMarcello Moores  Register On: 09/30/2016 07:12    Studies:  PFT 01/03/14 >> FEV1 0.85 (37%), FEV1% 42, TLC 6.17 (127%), DLCO 48%, +BD Echo 09/25/16 >> EF 65 to 70%, grade 1 DD CT chest 09/25/16 >> CM with small pericardial effusion, atherosclerosis, centrilobular emphysema CT head 09/26/16 >> microvascular changes  Lines/tubes: ETT 1/14 >> 1/16  I reviewed CXR myself, pulmonary edema noted  Summary: 66 yo female smoker with progressive dyspnea, hypoxia from AECOPD with emphysema  She  is followed by Dr. Lake Bells in pulmonary office.  Assessment/plan:  Acute on chronic hypoxic/hypercapnic respiratory failure from AECOPD. Elevated Lt hemidiaphragm. Pulmonary hypertension and grade I diastolic dysfunction Pulmonary edema - Pulmicort - Brovana - Patient does not want to be on incruse or spiriva (concern for cost as outpatient), will d/c - Continue schedule duoneb for now - Nicotine patch - D/C chest PT and BiPAP  Hx of HTN with chronic diastolic CHF. Atherosclerosis on CT chest. - Lasix dosed daily, will give 40 mg IV q8 x2 doses - Continue ASA 81 mg daily - May need  further cardiology assessment as outpt  Anemia of critical illness and chronic disease. - F/u CBC  Deconditioning. - PT recommending SNF  DVT prophylaxis - SQ heparin SUP - not indicated Nutrition - Heart healthy diet Goals of care - Full code  Discussed with TRH-MD and bedside RN  Updated pt and daughter at bedside  Transfer to SDU and to [facility] service with PCCM following as consult.  Rush Farmer, M.D. [facility] Pulmonary/Critical Care Medicine. Pager: [phone]. After hours pager: [phone]

## 2016-10-01 NOTE — Progress Notes (Signed)
Pt to transfer to 4N01; report called to RN; NT in room assisting pt with bath at this time; will transfer once bath complete; will cont. To monitor.  Ruben Reason

## 2016-10-01 NOTE — Telephone Encounter (Signed)
Samples have been left at the front desk. Samantha with Case Management has been made aware. Nothing further was needed.

## 2016-10-01 NOTE — Clinical Social Work Note (Signed)
Clinical Social Work Assessment  Patient Details  Name: [name] MRN: [id number] Date of Birth: [birth year]  Date of referral:  10/01/16               Reason for consult:  Facility Placement, Emotional/Coping/Adjustment to Illness, Family Concerns, [employer], Discharge Planning                Permission sought to share information with:  Case Freight forwarder, Customer service manager, Family Supports Permission granted to share information::  Yes, Verbal Permission Granted  Name::        Agency::     Relationship::  Belenda Cruise, Daughter  Contact Information:     Housing/Transportation Living arrangements for the past 2 months:  [place] of Information:  Patient, Medical Team, Case Manager, Adult Children Patient Interpreter Needed:  None Criminal Activity/Legal Involvement Pertinent to Current Situation/Hospitalization:  No - Comment as needed Significant Relationships:  Adult Children, Other Family Members Lives with:  Parents, Siblings Do you feel safe going back to the place where you live?  Yes Need for family participation in patient care:  Yes (Comment) (due to family concerns)  Care giving concerns:  LCSW had long conversation with patient daughter who reports multiple concerns with patient specifically her denial and depression regarding her medical status.  Daughter reports patient lives at home with her elderly mother and brother.  Mother and brother are disabled, but they all take care of one another.  Reports patient was not forthcoming with her respiratory status and was not following recommendations by MD with home 02.  Daughter reports patient continues to smoke daily and has for many years even thought recommendations have been for her to stop and she has had multiple hospitalizations.  Daughter reports patient can ambulate on her own at home and takes care of ADLs, but does not drive and depression stems from not being married, living at home  with her mother, and currently purpose in life.  Daughter voices social concerns for patient, but patient refuses to engage.  Daughter would like information about outpatient follow up for mental health.  Information placed on AVS.   Social Worker assessment / plan:  LCSW completed consult and spoke with daughter via phone.  Will meet with patient alone today to discuss disposition options of SNF vs home.  Patient currently reporting only wanting to go home, but recommendations and options will be reviewed. Patient continues to use HF 02 at 12L.    Plan currently is to discuss and give options for home vs SNF.  If patient agreeable, LCSW will complete SNF work up.  Family is wanting SNF for ST in effort to stabilize patient medically and help her get stronger.   Employment status:  Disabled (Comment on whether or not currently receiving Disability), Retired Forensic scientist:  Medicare PT Recommendations:  [place] / Referral to community resources:  Outpatient Psychiatric Care (Comment Required), [place]  Patient/Family's Response to care:  Patient in denial, family understanding of current needs and issues with medical.  Patient/Family's Understanding of and Emotional Response to Diagnosis, Current Treatment, and Prognosis:  Patient voices understanding of how smoking has caused her medical problems, but is reluctant to quit at this time.  Emotional Assessment Appearance:  Appears older than stated age Attitude/Demeanor/Rapport:  Guarded, Apprehensive Affect (typically observed):  Guarded, Constricted Orientation:  Oriented to Self, Oriented to Place, Oriented to  Time, Oriented to Situation Alcohol / Substance use:  Tobacco Use (  heavy) Psych involvement (Current and /or in the community):  Outpatient Provider (recommendations made due to family concerns)  Discharge Needs  Concerns to be addressed:  Adjustment to Illness, Coping/Stress  Concerns Readmission within the last 30 days:  No Current discharge risk:  None Barriers to Discharge:  Continued Medical Work up   Marshell Garfinkel 10/01/2016, 12:05 PM

## 2016-10-01 NOTE — Care Management Note (Addendum)
Case Management Note  Patient Details  Name: [name] MRN: [id number] Date of Birth: Aug 24, 1951  Subjective/Objective:    Pt admitted with resp failure - hx of COPD and non compliance                Action/Plan:   PTA independent from home with mom and brother.  Information obtained from daughter as pt is ventilated; daughter voiced concerns about mom's pyschological health - pt has stated "there is no reason for her to be here" , pt is non compliant regarding COPD management  and following up with PCP.  CM consulted CSW for possible community resources.  Bedside nurse also made aware of concern and will discuss possible psych consult with attending post extubation.  CM will continue to follow for discharge needs   Expected Discharge Date:                  Expected Discharge Plan:  [place]  In-House Referral:  Clinical Social Work  Discharge planning Services  CM Consult  Post Acute Care Choice:    Choice offered to:     DME Arranged:    DME Agency:     HH Arranged:    [place] Agency:     Status of Service:  In process, will continue to follow  If discussed at Long Length of Stay Meetings, dates discussed:    Additional Comments: 10/01/2016  CM contacted pts daughter Genevieve Norlander and informed that verification has been obtained that pt does not have prescription coverage.  CM printed out Pulmicort and Brovana assistance application.  CM also provided 2 week supply card of Brovana. Daughter informed of Brovana samples ready for pickup at Dr Anastasia Pall office.   Daughter requested materials be given directly to her.  CM did inform pt that these materials will be left for daughter.  CM contacted [facility] and informed of pending supplemental oxygen need at discharge if pt is to discharge home.  Pt stated she sees her PCP on a regular basis.  CSW following per SNF recommendation    CM requested to assess pts medications for affordability.  Pt has active medicare A and B/BCBS  supplement  coverage only - opted not to purchase part D verified by CMA.  CM submitted benefits check just to make sure pt has no prescription coverage.  Pt informed CM that she is already on medication assistance for [facility] - medication is shipped straight to her home - assistance arranged through Dr Anastasia Pall office.  Attending is aware of lack of insurance and hardship with medications - attending working to ensure pt is prescribed lowest possible cost of meds out of pocket, CM contacted office and requested Brovana sample however this is a short term solution, CM also provided pts daughter/pt Brovana discount card.  CM urged pt to contact Medicare and requested Medicaid Part D ASAP.  Pt still requiring high flow [place] and medication assistance options will need to be explored closer to discharge.  Attending aware Maryclare Labrador, RN 10/01/2016, 11:45 AM

## 2016-10-01 NOTE — Progress Notes (Signed)
RT to bedside for patient desating on 60% FIO2 on Bi-Pap. Adjustments made to bi-pap and patient now sating 100%. Will continue to monitor patient.  Levon Hedger, RN

## 2016-10-01 NOTE — Progress Notes (Signed)
Physical Therapy Treatment Patient Details Name: [name] MRN: [id number] DOB: Jan 02, 1951 Today's Date: 10/01/2016    History of Present Illness 66 yo female quit smoking 2 y PTA followed for morbid obesity  GOLD II COPD (FEV1 54%) with hx of hypercarbic/hypoxic RF previously on home O2 last in early 2017 . Previous severe exacerbation w/ vent support in 2015  Admit from office with progressive doe x one week with desats into the 70's at rest on RA    PT Comments    Patient making progress with mobility and gait.  Remains impulsive.  Agree with need for SNF at d/c.  Follow Up Recommendations  SNF     Equipment Recommendations  Rolling walker with 5" wheels    Recommendations for Other Services OT consult     Precautions / Restrictions Precautions Precautions: Fall Restrictions Weight Bearing Restrictions: No    Mobility  Bed Mobility               General bed mobility comments: Patient in recliner  Transfers Overall transfer level: Needs assistance Equipment used: Rolling walker (2 wheeled) Transfers: Sit to/from Stand Sit to Stand: Min assist;+2 safety/equipment         General transfer comment: Verbal cues for hand placement and technique.  Assist to rise to stance and for balance.  Ambulation/Gait Ambulation/Gait assistance: Min assist;+2 safety/equipment Ambulation Distance (Feet): 140 Feet Assistive device: Rolling walker (2 wheeled) Gait Pattern/deviations: Step-through pattern;Trunk flexed Gait velocity: decreased Gait velocity interpretation: Below normal speed for age/gender General Gait Details: Repeated verbal cues for safe use of RW - to keep hands on RW and to keep feet inside RW.  Patient ambulated on 6L O2, 140' with no rest breaks.     Stairs            Wheelchair Mobility    Modified Rankin (Stroke Patients Only)       Balance           Standing balance support: Bilateral upper extremity supported Standing  balance-Leahy Scale: Poor Standing balance comment: reliant on RW                    Cognition Arousal/Alertness: Awake/alert Behavior During Therapy: Impulsive;Anxious Overall Cognitive Status: Impaired/Different from baseline Area of Impairment: Following commands;Safety/judgement;Problem solving       Following Commands: Follows one step commands consistently;Follows multi-step commands inconsistently Safety/Judgement: Decreased awareness of safety   Problem Solving: Difficulty sequencing;Requires verbal cues General Comments: Impulsive, initiating stance prior to equipment including RW is prepared.  Mult verbal cues for safety.    Exercises      General Comments        Pertinent Vitals/Pain Pain Assessment: No/denies pain    Home Living                      Prior Function            PT Goals (current goals can now be found in the care plan section) Progress towards PT goals: Progressing toward goals    Frequency    Min 3X/week      PT Plan Current plan remains appropriate    Co-evaluation             End of Session Equipment Utilized During Treatment: Gait belt;Oxygen Activity Tolerance: Patient limited by fatigue Patient left: in chair;with nursing/sitter in room;with family/visitor present (In w/c for transfer to 4N patient care unit)     Time: [id number] PT  Time Calculation (min) (ACUTE ONLY): 15 min  Charges:  $Gait Training: 8-22 mins                    G Codes:      Despina Pole 10-07-2016, 3:13 PM Carita Pian. Sanjuana Kava, [place] Pager [phone]

## 2016-10-01 NOTE — Progress Notes (Signed)
Pt arrived to 4N01 via wheelchair with staff and family member in NAD. Pt alert and oriented with O2 @6LNC . Pt able to transfer with standby assist to bed. Non-invasive monitoring continued. Skin swarm completed with Steffanie Dunn, RN without any areas of breakdown or concern noted except for ecchymosis to RUE. Pt oriented to room/unit. POC discussed. Pt v/u but argumentative to instruction to call for assist OOB to [facility]. Refused bed alarm activation. Refused SCDs. Non-skid slippers on and call bell within reach. Pt denies any needs or complaints at present. Will monitor.

## 2016-10-02 DIAGNOSIS — J441 Chronic obstructive pulmonary disease with (acute) exacerbation: Secondary | ICD-10-CM

## 2016-10-02 DIAGNOSIS — J9601 Acute respiratory failure with hypoxia: Secondary | ICD-10-CM

## 2016-10-02 LAB — PHOSPHORUS: Phosphorus: 4 mg/dL (ref 2.5–4.6)

## 2016-10-02 LAB — CBC
HCT: 38.6 % (ref 36.0–46.0)
Hemoglobin: 10.5 g/dL — ABNORMAL LOW (ref 12.0–15.0)
MCH: 24.6 pg — ABNORMAL LOW (ref 26.0–34.0)
MCHC: 27.2 g/dL — ABNORMAL LOW (ref 30.0–36.0)
MCV: 90.4 fL (ref 78.0–100.0)
Platelets: 122 10*3/uL — ABNORMAL LOW (ref 150–400)
RBC: 4.27 MIL/uL (ref 3.87–5.11)
RDW: 17 % — ABNORMAL HIGH (ref 11.5–15.5)
WBC: 5.2 10*3/uL (ref 4.0–10.5)

## 2016-10-02 LAB — BASIC METABOLIC PANEL
Anion gap: 8 (ref 5–15)
BUN: 28 mg/dL — ABNORMAL HIGH (ref 6–20)
CO2: 41 mmol/L — ABNORMAL HIGH (ref 22–32)
Calcium: 8.3 mg/dL — ABNORMAL LOW (ref 8.9–10.3)
Chloride: 95 mmol/L — ABNORMAL LOW (ref 101–111)
Creatinine, Ser: 0.77 mg/dL (ref 0.44–1.00)
GFR calc Af Amer: >60 mL/min (ref >60)
GFR calc non Af Amer: >60 mL/min (ref >60)
Glucose, Bld: 92 mg/dL (ref 65–99)
Potassium: 4.1 mmol/L (ref 3.5–5.1)
Sodium: 144 mmol/L (ref 135–145)

## 2016-10-02 LAB — GLUCOSE, CAPILLARY: Glucose-Capillary: 88 mg/dL (ref 65–99)

## 2016-10-02 LAB — MAGNESIUM: Magnesium: 2.2 mg/dL (ref 1.7–2.4)

## 2016-10-02 MED ORDER — INFLUENZA VAC SPLIT QUAD 0.5 ML IM SUSY: 0.5000 mL | PREFILLED_SYRINGE | INTRAMUSCULAR | Status: AC | PRN

## 2016-10-02 MED ORDER — PREDNISONE 50 MG PO TABS
50.0000 mg | ORAL_TABLET | Freq: Every day | ORAL | Status: DC
  Administered 2016-10-02 – 2016-10-04 (×3): 50 mg via ORAL
  Filled 2016-10-02 (×3): qty 1

## 2016-10-02 MED ORDER — LEVOFLOXACIN 500 MG PO TABS
500.0000 mg | ORAL_TABLET | Freq: Every day | ORAL | Status: DC
  Filled 2016-10-02: qty 1

## 2016-10-02 NOTE — Progress Notes (Signed)
Family Medicine Teaching Service Daily Progress Note Intern Pager: [phone]  Patient name: [name] Medical record number: [id number] Date of birth: 1951/04/04 Age: 66 y.o. Gender: female  Primary Care Provider: Ronnie Doss, DO Consultants: None Code Status: Full  Pt Overview and Major Events to Date:  1/12- Direct admit to CCM from pulmonology office 1/20 - came out of ICU to FPTS  Assessment and Plan: [name] is a 66 yo female smoker with progressive dyspnea, hypoxia from AECOPD with emphysema. PMH significant for psoriasis, HTN, OA, HFpEF, h/o breast cancer. She is followed by Dr. Lake Bells in pulmonary office.  Acute on chronic hypoxic/hypercapnic respiratory failure from AECOPD. Direct admit from pulmonologist office to CCM service. Was intubated for respiratory failure. Pulmonary edema was noted on imaging. Has pulmonary hypertension and grade I diastolic dysfunction. CTA without evidence of PE. Was extubated on 1/16. Targeting saturation 88-92%to prevent V/Q mismatching Pulmicort 0.25mg  Neb BID Holding home Dulera on pulmicort Continue Brovona Continue schedule duoneb for now Will start levaquin and steriods for COPD exacerbation and due to patient having continued poor lung sounds Incentive spirometry Mucinex BID May need CPT restarted Need HH evaluation for new oxygen requirement  Hx of HTN with chronic diastolic CHF. - holding Lasix at this time; not on home diuretic.  -monitor for fluid voerload - Continue ASA 81 mg daily - monitor BPs - currently normotensive  Anemia of chronic disease. Baseline Hbg 10.5. At baseline.  - F/u CBC  Deconditioning. - PT recommending SNF - continue daily PT  Tobacco Use.  Continue daily nicotine patch  FEN/GI: HH diet,  PPx: SQ Heparin  Disposition: Pending respiratory improvement   Subjective:  Continues to have cough this morning. Wondering when she will be able to go home.   Objective: Temp:  [97.5  F (36.4 C)-98.4 F (36.9 C)] 97.7 F (36.5 C) (01/20 0827) Pulse Rate:  [73-95] 90 (01/20 0300) Resp:  [14-24] 20 (01/20 0827) BP: (99-128)/(54-76) 116/75 (01/20 0827) SpO2:  [90 %-100 %] 92 % (01/20 0300) Weight:  [233 lb 4.8 oz (105.8 kg)] 233 lb 4.8 oz (105.8 kg) (01/20 0300) Physical Exam: General: sitting up in bedside chair, alert Neuro: anxious, no focal deficits appreciated, A&O Cardiac: RRR, no murmur Chest: decreased BS, no wheeze, normal work of breathing Ext: 1+ edema Skin: no rashes  Laboratory:  Recent Labs Lab 09/30/16 0433 10/01/16 0335 10/02/16 0154  WBC 4.6 5.3 5.2  HGB 10.4* 10.6* 10.5*  HCT 39.5 39.7 38.6  PLT 137* 141* 122*    Recent Labs Lab 09/30/16 0433 10/01/16 0335 10/02/16 0154  NA 148* 145 144  K 4.0 3.6 4.1  CL 97* 92* 95*  CO2 41* 43* 41*  BUN 14 25* 28*  CREATININE 0.51 0.74 0.77  CALCIUM 8.3* 8.3* 8.3*  GLUCOSE 86 126* 92    PFT 01/03/14 >> FEV1 0.85 (37%), FEV1% 42, TLC 6.17 (127%), DLCO 48%, +BD Echo 09/25/16 >> EF 65 to 70%, grade 1 DD CT head 09/26/16 >> microvascular changes  Imaging/Diagnostic Tests: X-ray Chest Pa And Lateral IMPRESSION: 1. Tiny CP angle effusion 2. Cardiomegaly with central vascular congestion and mild diffuse interstitial opacities suggestive of mild edema Electronically Signed   By: Donavan Foil M.D.   On: 09/24/2016 22:20   Ct Angio Chest Pe W Or Wo Contrast IMPRESSION: 1. No evidence for pulmonary embolus. 2. Cardiomegaly and a small pericardial effusion are stable without edema to suggest failure. 3. Coronary artery disease. 4. Aortic atherosclerosis. 5. Centrilobular emphysema.  Electronically Signed   By: San Morelle M.D.   On: 09/25/2016 10:47   Katheren Shams, DO 10/02/2016, 8:44 AM PGY-3, [place] Intern pager: [phone], text pages welcome

## 2016-10-02 NOTE — Progress Notes (Signed)
Report given to [place] on [place] and family made aware of pending transfer to different department. Awaiting staff assistance to move pt at this time.

## 2016-10-02 NOTE — Progress Notes (Signed)
Patient arrived to 3e01 and assisted into bed with standby assistance. Patient alert and oriented with no complaints of pain.  Patient oriented to new room and how to get in contact with nurse and tech.  Will continue to monitor.

## 2016-10-03 DIAGNOSIS — D509 Iron deficiency anemia, unspecified: Secondary | ICD-10-CM

## 2016-10-03 LAB — BASIC METABOLIC PANEL
Anion gap: 7 (ref 5–15)
BUN: 28 mg/dL — ABNORMAL HIGH (ref 6–20)
CO2: 34 mmol/L — ABNORMAL HIGH (ref 22–32)
Calcium: 8.8 mg/dL — ABNORMAL LOW (ref 8.9–10.3)
Chloride: 101 mmol/L (ref 101–111)
Creatinine, Ser: 0.59 mg/dL (ref 0.44–1.00)
GFR calc Af Amer: >60 mL/min (ref >60)
GFR calc non Af Amer: >60 mL/min (ref >60)
Glucose, Bld: 115 mg/dL — ABNORMAL HIGH (ref 65–99)
Potassium: 4.8 mmol/L (ref 3.5–5.1)
Sodium: 142 mmol/L (ref 135–145)

## 2016-10-03 LAB — CBC
HCT: 41.7 % (ref 36.0–46.0)
Hemoglobin: 10.9 g/dL — ABNORMAL LOW (ref 12.0–15.0)
MCH: 23.4 pg — ABNORMAL LOW (ref 26.0–34.0)
MCHC: 26.1 g/dL — ABNORMAL LOW (ref 30.0–36.0)
MCV: 89.7 fL (ref 78.0–100.0)
Platelets: 158 10*3/uL (ref 150–400)
RBC: 4.65 MIL/uL (ref 3.87–5.11)
RDW: 16.5 % — ABNORMAL HIGH (ref 11.5–15.5)
WBC: 6.1 10*3/uL (ref 4.0–10.5)

## 2016-10-03 MED ORDER — ALBUTEROL SULFATE (2.5 MG/3ML) 0.083% IN NEBU: 2.5000 mg | INHALATION_SOLUTION | RESPIRATORY_TRACT | 12 refills | Status: DC | PRN

## 2016-10-03 MED ORDER — ASPIRIN 81 MG PO CHEW: 81.0000 mg | CHEWABLE_TABLET | Freq: Every day | ORAL | 0 refills | Status: DC

## 2016-10-03 MED ORDER — PREDNISONE 50 MG PO TABS: 50.0000 mg | ORAL_TABLET | Freq: Every day | ORAL | 0 refills | Status: DC

## 2016-10-03 MED ORDER — MOMETASONE FURO-FORMOTEROL FUM 100-5 MCG/ACT IN AERO: 2.0000 | INHALATION_SPRAY | Freq: Two times a day (BID) | RESPIRATORY_TRACT | 1 refills | Status: DC

## 2016-10-03 MED ORDER — FUROSEMIDE 20 MG PO TABS
20.0000 mg | ORAL_TABLET | Freq: Every day | ORAL | Status: DC
  Administered 2016-10-03 – 2016-10-04 (×2): 20 mg via ORAL
  Filled 2016-10-03 (×2): qty 1

## 2016-10-03 MED ORDER — NICOTINE 7 MG/24HR TD PT24: 7.0000 mg | MEDICATED_PATCH | Freq: Every day | TRANSDERMAL | 0 refills | Status: DC

## 2016-10-03 MED ORDER — TIOTROPIUM BROMIDE MONOHYDRATE 18 MCG IN CAPS: 18.0000 ug | ORAL_CAPSULE | Freq: Every day | RESPIRATORY_TRACT | 12 refills | Status: DC

## 2016-10-03 MED ORDER — GUAIFENESIN ER 600 MG PO TB12: 1200.0000 mg | ORAL_TABLET | Freq: Two times a day (BID) | ORAL | 0 refills | Status: DC

## 2016-10-03 MED ORDER — FUROSEMIDE 20 MG PO TABS: 20.0000 mg | ORAL_TABLET | Freq: Every day | ORAL | 0 refills | Status: DC

## 2016-10-03 MED ORDER — IPRATROPIUM-ALBUTEROL 0.5-2.5 (3) MG/3ML IN SOLN
3.0000 mL | Freq: Two times a day (BID) | RESPIRATORY_TRACT | Status: DC
  Administered 2016-10-03: 3 mL via RESPIRATORY_TRACT
  Filled 2016-10-03 (×2): qty 3

## 2016-10-03 NOTE — Care Management Note (Signed)
Case Management Note  Patient Details  Name: [name] MRN: [id number] Date of Birth: September 24, 1950  Subjective/Objective:   66 y.o. F to be discharged home with Home Oxygen today. [facility] will provide prior to discharge home today.                  Action/Plan:CM will sign off for now but will be available should additional discharge needs arise or disposition change.    Expected Discharge Date:                  Expected Discharge Plan:  [place]  In-House Referral:  Clinical Social Work  Discharge planning Services  CM Consult  Post Acute Care Choice:  Durable Medical Equipment Choice offered to:  Patient  DME Arranged:  Oxygen DME Agency:  [place] Agency:  NA [place] Agency:  NA  Status of Service:  Completed, signed off  If discussed at [place] of Stay Meetings, dates discussed:    Additional Comments:  Delrae Sawyers, RN 10/03/2016, 4:27 PM

## 2016-10-03 NOTE — Discharge Summary (Signed)
[facility] Discharge Summary  Patient name: [name] Medical record number: [id number] Date of birth: 01/30/1951 Age: 66 y.o. Gender: female Date of Admission: 09/24/2016  Date of Discharge: 10/04/2016  Admitting Physician: Juanito Doom, MD  Primary Care Provider: Ronnie Doss, DO Consultants: CCM  Indication for Hospitalization:  Acute hypoxic/hypercapnic resp failure  Discharge Diagnoses/Problem List:  AoC RF COPD Exacerbation HFpEF PAH HTN Anemia of Chronic Dz  Disposition: SNF - [facility]  Discharge Condition: Stable  Brief Hospital Course:  [name] is a 66 yo female smoker with progressive dyspnea, hypoxia from AECOPD with emphysema. PMH significant for psoriasis, HTN, OA, HFpEF, h/o breast cancer.She is followed by Dr. Lake Bells in pulmonary office.  Acute on chronic hypoxic/hypercapnic respiratory failure from AECOPD and CHF exacerbation. Improved. Patient was directly admitted to ICU from pulmonologist office due to respiratory failure. She was intubated from 1/13 to 1/16. CTA without evidence of PE. She was treated for COPD exacerbation with breathing treatments. She was also diuresed aggressively with IV Lasix. Weight trended down from 254 Lbs on admission to 233 Lbs on discharge. Dry weight appears to be about 232 pounds (the smallest over the last 2 years). Patient was discharged on Dulera and Spiriva to optimize his COPD management. She was also discharged on prednisone to complete the 5 days course (1/20-1/24).  Patient was evaluated by physical therapy who recommended disposition to SNF.  Patient was ambulated prior to discharge for oxygen requirement. She desated to 81% on room air with ambulation. Oxygen saturation came up to lower 90s on 3L. So she may need oxygen by [place] going forward.  HFpEF: Echo on 09/25/2016 with EF of 65-70%, G1DD, moderate LAE and PAPP of 54. CXR with pulmonary congestion. She was aggressively  diuresed in ICU. Weight down 22lbs. She appears to be at baseline weight at 233 lbs, the lowest in the last two years. Dyspnea has improved remarkably. She hasn't been on Lasix before. So she was discharged on Lasix 20 mg daily. Recommend checking BMP in a week  PAH: PAPP 54 mmHg on 09/25/2016. Likely secondary in the setting of HF and COPD. Oral Lasix as above.   Diarrhea: Reports 4 bowel movements in the last 24 hours. Describes the stool as "mushy. Nonbloody. Denies fever abdominal pain. No laxative within the last 72 hours. No antibiotic this admission. -C. Difficile -GI precautions  Her other chronic medical conditions are stable   Issues for Follow Up:  1. COPD: Assess respiratory status and compliance with medication. Encourage her to quit smoking. F/u with Dr. Lake Bells (pulm) in 2 weeks. 1. May need regular OP supplemental O2 2. Scheduled Duonebs Q12hr 2. CHF: assess fluid status. Recommend BMP in a week 3. Prednisone: taper; 30mg  x 2 days, 20mg  x 2 days, 10mg  x 2 days. (starting 10/05/16) 4. Will need OP sleep study for OSA Dx. 5. Per pharm TOC patient has difficulty with affording medications. May need Spiriva samples.    Significant Procedures:  Intubation in ICU from 1/13-1/16  Significant Labs and Imaging:   Recent Labs Lab 10/01/16 0335 10/02/16 0154 10/03/16 0313  WBC 5.3 5.2 6.1  HGB 10.6* 10.5* 10.9*  HCT 39.7 38.6 41.7  PLT 141* 122* 158    Recent Labs Lab 09/28/16 0252 09/29/16 0248 09/30/16 0433 10/01/16 0335 10/02/16 0154 10/03/16 0313  NA 143 146* 148* 145 144 142  K 3.9 3.3* 4.0 3.6 4.1 4.8  CL 100* 104 97* 92* 95* 101  CO2 36* 35*  41* 43* 41* 34*  GLUCOSE 88 82 86 126* 92 115*  BUN 24* 14 14 25* 28* 28*  CREATININE 0.45 0.40* 0.51 0.74 0.77 0.59  CALCIUM 7.6* 7.8* 8.3* 8.3* 8.3* 8.8*  MG 2.0 2.0 1.9 2.3 2.2  --   PHOS 3.6 3.3 3.8 4.7* 4.0  --   ALBUMIN 2.4* 2.4* 2.7*  --   --   --     Results/Tests Pending at Time of Discharge:  none  Discharge Medications:  Allergies as of 10/04/2016   No Known Allergies     Medication List    TAKE these medications   albuterol (2.5 MG/3ML) 0.083% nebulizer solution Commonly known as:  PROVENTIL Take 3 mLs (2.5 mg total) by nebulization every 2 (two) hours as needed for wheezing.   aspirin 81 MG chewable tablet Chew 1 tablet (81 mg total) by mouth daily.   furosemide 20 MG tablet Commonly known as:  LASIX Take 1 tablet (20 mg total) by mouth daily.   guaiFENesin 600 MG 12 hr tablet Commonly known as:  MUCINEX Take 2 tablets (1,200 mg total) by mouth 2 (two) times daily.   mometasone-formoterol 100-5 MCG/ACT Aero Commonly known as:  DULERA Inhale 2 puffs into the lungs 2 (two) times daily. What changed:  when to take this  reasons to take this   multivitamin with minerals Tabs tablet Take 1 tablet by mouth daily.   nicotine 7 mg/24hr patch Commonly known as:  NICODERM CQ - dosed in mg/24 hr Place 1 patch (7 mg total) onto the skin daily.   predniSONE 10 MG tablet Commonly known as:  DELTASONE Take 3 tablets (30mg ) in the morning with food for 2 days. Then take 2 tabs (20mg ) for 2 days. Then take 1 tab (10mg ) for 2 days.   tiotropium 18 MCG inhalation capsule Commonly known as:  SPIRIVA HANDIHALER Place 1 capsule (18 mcg total) into inhaler and inhale daily.            Durable Medical Equipment        Start     Ordered   10/03/16 1621  For home use only DME oxygen  Once    Question Answer Comment  Mode or (Route) Nasal cannula   Liters per Minute 3   Frequency Continuous (stationary and portable oxygen unit needed)   Oxygen delivery system Gas      10/03/16 1621      Discharge Instructions: Please refer to Patient Instructions section of EMR for full details.  Patient was counseled important signs and symptoms that should prompt return to medical care, changes in medications, dietary instructions, activity restrictions, and follow up  appointments.   Follow-Up Appointments:  Contact information for follow-up providers    Ronnie Doss, DO. Go on 10/05/2016.   Specialty:  Family Medicine Why:  Hospital follow-up Contact information: [id number] [place] [address] [phone]        Inc[facility] - [facility] Follow up.   Why:  Oxygen was ordered by your MD and will be delivered to your rioom prior to discharge. Please call above for any issues after original delivery.  Contact information: [address] [address] [phone]            Contact information for after-discharge care    Destination    [facility] .   Specialty:  [place] information: [id number] [place] [phone]  Elberta Leatherwood, MD 10/04/2016, 2:21 PM PGY-3, [place]

## 2016-10-03 NOTE — Progress Notes (Signed)
Family Medicine Teaching Service Daily Progress Note Intern Pager: [phone]  Patient name: [name] Medical record number: [id number] Date of birth: 21-Aug-1951 Age: 66 y.o. Gender: female  Primary Care Provider: Ronnie Doss, DO Consultants: None Code Status: Full  Pt Overview and Major Events to Date:  1/12- Direct admit to CCM from pulmonology office 1/20 - came out of ICU to FPTS  Assessment and Plan: [name] is a 66 yo female smoker with progressive dyspnea, hypoxia from AECOPD with emphysema. PMH significant for psoriasis, HTN, OA, HFpEF, h/o breast cancer. She is followed by Dr. Lake Bells in pulmonary office.  Acute on chronic hypoxic/hypercapnic respiratory failure from AECOPD. Improved. Direct admit from pulmonologist office to CCM service. Intubated from 1/13 to 1/16. CTA without evidence of PE. On 6 L overnight. She denies dyspnea or chest pain. Reports ambulating in the hall with PT yesterday.  - Wean oxygen as able. Will ambulate for oxygen requirement.  -Targeting saturation 88-92%to prevent V/Q mismatching -Pulmicort 0.25mg  Neb BID -Holding home Dulera on pulmicort -Continue [place] -Continue schedule duoneb for now -Will start levaquin and steriods for COPD exacerbation and due to patient having continued poor lung sounds -Incentive spirometry -Mucinex BID -May need CPT restarted -Need HH evaluation for new oxygen requirement  HFpEF: Echo on 09/25/2016 with EF of 65-70%, G1DD, moderate LAE and PAPP of 54. CXR with pulmonary congestion. She was aggressively diuresed in ICU. Weight down 22lbs. She appears to be at baseline weight at 232 lbs, the lowest in the last two years. Dyspnea has improved remarkably.  Wt Readings from Last 10 Encounters:  10/03/16 232 lb 1.6 oz (105.3 kg)  09/24/16 253 lb 12.8 oz (115.1 kg)  12/29/15 241 lb 9.6 oz (109.6 kg)  09/10/15 232 lb 4.8 oz (105.4 kg)  09/02/15 237 lb (107.5 kg)  08/08/15 236 lb 9.6 oz (107.3 kg)   07/28/15 250 lb (113.4 kg)  07/24/15 247 lb 9.2 oz (112.3 kg)  05/01/14 235 lb (106.6 kg)  02/05/14 234 lb (106.1 kg)   -PO lasix 20 mg daily  PAH: PAPP 54 mmHg on 09/25/2016. Likely secondary in the setting of HF and COPD -PO lasix as above.   HTN: currently normotensive. Not on any antihypertensive medication at home - Monitor for fluid voerload - Continue ASA 81 mg daily - Monitor BPs  Diarrhea: Reports 4 bowel movements in the last 24 hours. Describes the stool as "mushy. Nonbloody. Denies fever abdominal pain. No laxative within the last 72 hours. No antibiotic this admission. -C. Difficile -GI precautions  Anemia of chronic disease: Hbg stable at 10.5 (baseline).  - F/u CBC  Deconditioning. - PT recommending SNF. Patient reports ambulating well with PT. Doesn't feel like she needs to go SNF.  - Will discharge with home PT  Tobacco Use.  Continue daily nicotine patch  FEN/GI: HH diet  PPx: SQ Heparin  Disposition: Will discharge home with home PT pending assessment for oxygen requirement and diarrhea.  Subjective:  Stated that she is feeling great. Denies shortness of breath or chest pain. She is sitting on bedside chair. She is still on 6L by [place]. Reports 4 bowel movements in the last 24 hours. States that the stool is mushy not watery. Denies blood in stool. Denies abdominal pain. Reports ambulating in a hall well yesterday. Doesn't think she needs to go to SNF as PT recommended.   Objective: Temp:  [97 F (36.1 C)-98.7 F (37.1 C)] 97 F (36.1 C) (01/21 0630) Pulse Rate:  [84-98]  84 (01/21 0630) Resp:  [16-24] 18 (01/21 0630) BP: (115-140)/(60-86) 140/86 (01/21 0630) SpO2:  [91 %-97 %] 93 % (01/21 0630) Weight:  [232 lb 1.6 oz (105.3 kg)] 232 lb 1.6 oz (105.3 kg) (01/21 0630) Physical Exam: GEN: appears well, sitting on bedside chair, on 6 L by [place], no apparent distress. CVS: RRR, nl s1 & s2, no murmurs, no edema RESP: speaks in full sentence, no IWOB,  mildly diminished air movement over lower lung fields bilaterally, CTAB GI: obese, BS present & normal, soft, NTND, no guarding, no rebound, no mass MSK: no focal tenderness or notable swelling SKIN: no apparent skin lesion NEURO: alert and oiented appropriately, no gross defecits  PSYCH: euthymic mood with congruent affect  Laboratory:  Recent Labs Lab 10/01/16 0335 10/02/16 0154 10/03/16 0313  WBC 5.3 5.2 6.1  HGB 10.6* 10.5* 10.9*  HCT 39.7 38.6 41.7  PLT 141* 122* 158    Recent Labs Lab 10/01/16 0335 10/02/16 0154 10/03/16 0313  NA 145 144 142  K 3.6 4.1 4.8  CL 92* 95* 101  CO2 43* 41* 34*  BUN 25* 28* 28*  CREATININE 0.74 0.77 0.59  CALCIUM 8.3* 8.3* 8.8*  GLUCOSE 126* 92 115*    PFT 01/03/14 >> FEV1 0.85 (37%), FEV1% 42, TLC 6.17 (127%), DLCO 48%, +BD Echo 09/25/16 >> EF 65 to 70%, grade 1 DD CT head 09/26/16 >> microvascular changes  Imaging/Diagnostic Tests: X-ray Chest Pa And Lateral IMPRESSION: 1. Tiny CP angle effusion 2. Cardiomegaly with central vascular congestion and mild diffuse interstitial opacities suggestive of mild edema Electronically Signed   By: Donavan Foil M.D.   On: 09/24/2016 22:20   Ct Angio Chest Pe W Or Wo Contrast IMPRESSION: 1. No evidence for pulmonary embolus. 2. Cardiomegaly and a small pericardial effusion are stable without edema to suggest failure. 3. Coronary artery disease. 4. Aortic atherosclerosis. 5. Centrilobular emphysema. Electronically Signed   By: San Morelle M.D.   On: 09/25/2016 10:47   Mercy Riding, MD 10/03/2016, 8:20 AM PGY-2, [place] Intern pager: [phone], text pages welcome

## 2016-10-03 NOTE — Progress Notes (Signed)
SATURATION QUALIFICATIONS: (This note is used to comply with regulatory documentation for home oxygen)  Patient Saturations on Room Air at Rest = 88  Patient Saturations on Room Air while Ambulating =81%  Patient Saturations on 3 Liters of oxygen while Ambulating = 91%  Please briefly explain why patient needs home oxygen Patient saturation to room air is only 81%

## 2016-10-03 NOTE — Discharge Instructions (Signed)
It has been a pleasure taking care of you! You were admitted due to shortness of breath, which was likely due to COPD and heart failure. You were initially intubated in ICU. You were treated with breathing treatments. You were also treated with medication to get rid of excess fluid. With all these, his symptoms have improved to the point we think it is safe to let you go home and follow up with your primary care doctor. We are discharging you on oxygen that you can use at home, as well as other medications that you need to continue taking after you leave the hospital. There could also be some changes made to your home medications during this hospitalization. Please, make sure to read the directions before you take them. The names and directions on how to take these medications are found on this discharge paper under medication section.  Please follow up at your primary care doctor's office. The address, date and time are found on the discharge paper under follow up section.  Take care,

## 2016-10-04 ENCOUNTER — Telehealth: Payer: Self-pay | Admitting: Family Medicine

## 2016-10-04 DIAGNOSIS — J9611 Chronic respiratory failure with hypoxia: Secondary | ICD-10-CM | POA: Diagnosis not present

## 2016-10-04 DIAGNOSIS — J9622 Acute and chronic respiratory failure with hypercapnia: Secondary | ICD-10-CM | POA: Diagnosis not present

## 2016-10-04 DIAGNOSIS — J438 Other emphysema: Secondary | ICD-10-CM | POA: Diagnosis not present

## 2016-10-04 DIAGNOSIS — I5032 Chronic diastolic (congestive) heart failure: Secondary | ICD-10-CM | POA: Diagnosis not present

## 2016-10-04 DIAGNOSIS — I5030 Unspecified diastolic (congestive) heart failure: Secondary | ICD-10-CM | POA: Diagnosis not present

## 2016-10-04 DIAGNOSIS — R488 Other symbolic dysfunctions: Secondary | ICD-10-CM | POA: Diagnosis not present

## 2016-10-04 DIAGNOSIS — G4733 Obstructive sleep apnea (adult) (pediatric): Secondary | ICD-10-CM | POA: Diagnosis not present

## 2016-10-04 DIAGNOSIS — M1990 Unspecified osteoarthritis, unspecified site: Secondary | ICD-10-CM | POA: Diagnosis not present

## 2016-10-04 DIAGNOSIS — J9600 Acute respiratory failure, unspecified whether with hypoxia or hypercapnia: Secondary | ICD-10-CM | POA: Diagnosis not present

## 2016-10-04 DIAGNOSIS — Z720 Tobacco use: Secondary | ICD-10-CM | POA: Diagnosis not present

## 2016-10-04 DIAGNOSIS — G4719 Other hypersomnia: Secondary | ICD-10-CM | POA: Diagnosis not present

## 2016-10-04 DIAGNOSIS — Z593 Problems related to living in residential institution: Secondary | ICD-10-CM | POA: Diagnosis not present

## 2016-10-04 DIAGNOSIS — I110 Hypertensive heart disease with heart failure: Secondary | ICD-10-CM | POA: Diagnosis not present

## 2016-10-04 DIAGNOSIS — Z9981 Dependence on supplemental oxygen: Secondary | ICD-10-CM | POA: Diagnosis not present

## 2016-10-04 DIAGNOSIS — D638 Anemia in other chronic diseases classified elsewhere: Secondary | ICD-10-CM | POA: Diagnosis not present

## 2016-10-04 DIAGNOSIS — I2721 Secondary pulmonary arterial hypertension: Secondary | ICD-10-CM | POA: Diagnosis not present

## 2016-10-04 DIAGNOSIS — J9601 Acute respiratory failure with hypoxia: Secondary | ICD-10-CM | POA: Diagnosis not present

## 2016-10-04 DIAGNOSIS — R0689 Other abnormalities of breathing: Secondary | ICD-10-CM | POA: Diagnosis not present

## 2016-10-04 DIAGNOSIS — J9621 Acute and chronic respiratory failure with hypoxia: Secondary | ICD-10-CM | POA: Diagnosis not present

## 2016-10-04 DIAGNOSIS — R531 Weakness: Secondary | ICD-10-CM | POA: Diagnosis not present

## 2016-10-04 DIAGNOSIS — M6281 Muscle weakness (generalized): Secondary | ICD-10-CM | POA: Diagnosis not present

## 2016-10-04 DIAGNOSIS — J441 Chronic obstructive pulmonary disease with (acute) exacerbation: Secondary | ICD-10-CM | POA: Diagnosis not present

## 2016-10-04 LAB — C DIFFICILE QUICK SCREEN W PCR REFLEX
C Diff antigen: NEGATIVE
C Diff interpretation: NOT DETECTED
C Diff toxin: NEGATIVE

## 2016-10-04 MED ORDER — ASPIRIN 81 MG PO CHEW: 81.0000 mg | CHEWABLE_TABLET | Freq: Every day | ORAL | 0 refills | Status: AC

## 2016-10-04 MED ORDER — PREDNISONE 10 MG PO TABS: ORAL_TABLET | ORAL | 0 refills | Status: DC

## 2016-10-04 MED ORDER — NICOTINE 7 MG/24HR TD PT24: 7.0000 mg | MEDICATED_PATCH | Freq: Every day | TRANSDERMAL | 0 refills | Status: DC

## 2016-10-04 MED ORDER — IPRATROPIUM-ALBUTEROL 0.5-2.5 (3) MG/3ML IN SOLN: 3.0000 mL | Freq: Two times a day (BID) | RESPIRATORY_TRACT | 0 refills | Status: DC

## 2016-10-04 MED ORDER — TIOTROPIUM BROMIDE MONOHYDRATE 18 MCG IN CAPS: 18.0000 ug | ORAL_CAPSULE | Freq: Every day | RESPIRATORY_TRACT | 12 refills | Status: DC

## 2016-10-04 MED ORDER — MOMETASONE FURO-FORMOTEROL FUM 100-5 MCG/ACT IN AERO: 2.0000 | INHALATION_SPRAY | Freq: Two times a day (BID) | RESPIRATORY_TRACT | 1 refills | Status: DC

## 2016-10-04 MED ORDER — PREDNISONE 20 MG PO TABS: 30.0000 mg | ORAL_TABLET | Freq: Every day | ORAL | Status: DC

## 2016-10-04 MED ORDER — ALBUTEROL SULFATE (2.5 MG/3ML) 0.083% IN NEBU: 2.5000 mg | INHALATION_SOLUTION | RESPIRATORY_TRACT | 12 refills | Status: DC | PRN

## 2016-10-04 MED ORDER — GUAIFENESIN ER 600 MG PO TB12: 1200.0000 mg | ORAL_TABLET | Freq: Two times a day (BID) | ORAL | 0 refills | Status: DC

## 2016-10-04 MED ORDER — FUROSEMIDE 20 MG PO TABS: 20.0000 mg | ORAL_TABLET | Freq: Every day | ORAL | 0 refills | Status: DC

## 2016-10-04 NOTE — Clinical Social Work Note (Signed)
CSW presented patient and her daughter with bed offers. They have chosen [facility]. SNF notified and can take today. CSW paged MD. Per RN, patient's c. Diff test came back negative.   Dayton Scrape, [place]

## 2016-10-04 NOTE — Telephone Encounter (Signed)
Received call from [facility] to notify of new admission.  Patient planned to come out to [facility] facility today.  Med rec completed.  Ashly M. Lajuana Ripple, DO PGY-3, [facility]

## 2016-10-04 NOTE — Progress Notes (Signed)
Transitions of Care Pharmacy Note  Plan:  Educated on spiriva, albuterol, dulera, prednisone, furosemide, and lifestyle modifications; including, decreasing salt intake and smoking cessation.  Addressed concerns regarding the rationale for all of the medications being used for COPD and their price.  Recommend switching to albuterol inhaler as the patient does not have a nebulizer Follow-up with family medicine tomorrow. --------------------------------------------- [name] is an 66 y.o. female who presents with a chief complaint COPD. In anticipation of discharge, pharmacy has reviewed this patient's prior to admission medication history, as well as current inpatient medications listed per the [facility].  Current medication indications, dosing, frequency, and notable side effects reviewed with patient. patient verbalized understanding of current inpatient medication regimen and is aware that the After Visit Summary when presented, will represent the most accurate medication list at discharge.   [name] expressed concerns regarding why she is on so many different medications, side effects associated with the medications, and pricing.    Assessment: Understanding of regimen: good Understanding of indications: good Potential of compliance: fair Barriers to Obtaining Medications: Yes, she does not have prescription insurance. Spoke with Family Med who will investigate options for her as the patient states she does not have prescription coverage.  Patient instructed to contact inpatient pharmacy team with further questions or concerns if needed.    Time spent preparing for discharge counseling: 30 minutes Time spent counseling patient: 90 minutes   Thank you for allowing pharmacy to be a part of this patient's care.  Dierdre Harness, Cain Sieve, PharmD Clinical Pharmacy Resident [phone] (Pager) 10/04/2016 3:41 PM

## 2016-10-04 NOTE — Progress Notes (Signed)
Family Medicine Teaching Service Daily Progress Note Intern Pager: [phone]  Patient name: [name] Medical record number: [id number] Date of birth: 12-Jun-1951 Age: 66 y.o. Gender: female  Primary Care Provider: Ronnie Doss, DO Consultants: None Code Status: Full  Pt Overview and Major Events to Date:  01/12: Direct admit to CCM from pulmonology office 01/20: Came out of ICU to [facility] 01/22: Agreeable to SNF after PT evaluation rec on 01/19  Assessment and Plan: [name] is a 66 yo female smoker with progressive dyspnea, hypoxia from AECOPD with emphysema. PMH significant for psoriasis, HTN, OA, HFpEF, h/o breast cancer. She is followed by Dr. Lake Bells in pulmonary office.  #Acute on chronic hypoxic/hypercapnic respiratory failure from AECOPD: Improved. Direct admit from pulmonologist office to CCM service. Intubated from 1/13 to 1/16. CTA without evidence of PE. On 3 L overnight. She denies dyspnea or chest pain at present and has been able to ambulate with PT. Overall will need SNF concerning PT and home O2 upon d/c. --Wean oxygen as able  --Targeting saturation 88-92%to prevent V/Q mismatching --Pulmicort 0.25 mg Neb BID --Holding home [place] while on pulmicort --[place] --Duoneb --Prednisone 50 mg QD (day 3 of 5) --Incentive spirometry --Mucinex BID --May need CPT restarted --CSW for SNF  #HFpEF: Echo on 09/25/2016 with EF of 65-70%, G1DD, moderate LAE and PAPP of 54. CXR with pulmonary congestion. She was aggressively diuresed in ICU. Weight down 22lbs. She appears to be at baseline weight at 232 lbs, the lowest in the last two years. Dyspnea has improved remarkably. Weight now at 233 today. Wt Readings from Last 10 Encounters:  10/04/16 233 lb 14.4 oz (106.1 kg)  09/24/16 253 lb 12.8 oz (115.1 kg)  12/29/15 241 lb 9.6 oz (109.6 kg)  09/10/15 232 lb 4.8 oz (105.4 kg)  09/02/15 237 lb (107.5 kg)  08/08/15 236 lb 9.6 oz (107.3 kg)  07/28/15 250 lb (113.4 kg)   07/24/15 247 lb 9.2 oz (112.3 kg)  05/01/14 235 lb (106.6 kg)  02/05/14 234 lb (106.1 kg)  --PO lasix 20 mg daily  #PAH: Stable. PAPP 54 mmHg on 09/25/2016. Likely secondary in the setting of HF and COPD. --PO lasix 20 mg daily  #HTN: Chronic, stable. Currently normotensive. Not on any antihypertensive medication at home. --Monitor for fluid overload --Continue ASA 81 mg daily --Monitor BPs  #Diarrhea: Acute. Reports frequent "mushy, non bloody" bowel movements. Denies fever abdominal pain. No laxative within the last 72 hours. No antibiotic this admission. --C. Difficile pending --GI precautions  #Anemia of chronic disease: Chronic. Hbg stable at 10.5 (baseline).  --F/u CBC  #Deconditioning: Chronic, would benefit from weight loss. Complicated with underlying COPD and PAH. --PT recommending SNF   #Tobacco Use.  --Nicotine patch  FEN/GI: Heart healthy diet PPx: Heparin SQ  Disposition: Pending SNF placement per PT recommendation.  Subjective:  Patient states she thought about the PT recommendation overnight and decided she would like to go given her current condition. She states she is ready to go and overall feels well. Denies CP and SOB while on O2. No new complaints as of this AM.   Objective: Temp:  [97.8 F (36.6 C)-98.5 F (36.9 C)] 98.5 F (36.9 C) (01/22 0741) Pulse Rate:  [72-83] 73 (01/22 0741) Resp:  [16-18] 18 (01/22 0741) BP: (117-135)/(54-75) 127/54 (01/22 0741) SpO2:  [91 %-95 %] 91 % (01/22 0741) Weight:  [233 lb 14.4 oz (106.1 kg)] 233 lb 14.4 oz (106.1 kg) (01/22 0600) Physical Exam: General: obese, well nourished,  well developed, in no acute distress with non-toxic appearance HEENT: normocephalic, atraumatic, moist mucous membranes Neck: supple, non-tender without lymphadenopathy CV: regular rate and rhythm without murmurs, rubs, or gallops, non-edematous Lungs: clear to auscultation bilaterally with normal work of breathing on 3 L [place], speaking  in full sentances Abdomen: soft, non-tender, normoactive bowel sounds Skin: warm, dry, no rashes or lesions, cap refill < 2 seconds Extremities: warm and well perfused, normal tone  Laboratory:  Recent Labs Lab 10/01/16 0335 10/02/16 0154 10/03/16 0313  WBC 5.3 5.2 6.1  HGB 10.6* 10.5* 10.9*  HCT 39.7 38.6 41.7  PLT 141* 122* 158    Recent Labs Lab 10/01/16 0335 10/02/16 0154 10/03/16 0313  NA 145 144 142  K 3.6 4.1 4.8  CL 92* 95* 101  CO2 43* 41* 34*  BUN 25* 28* 28*  CREATININE 0.74 0.77 0.59  CALCIUM 8.3* 8.3* 8.8*  GLUCOSE 126* 92 115*    PFT 01/03/14 >> FEV1 0.85 (37%), FEV1% 42, TLC 6.17 (127%), DLCO 48%, +BD Echo 09/25/16 >> EF 65 to 70%, grade 1 DD CT head 09/26/16 >> microvascular changes  Imaging/Diagnostic Tests: DG Abd Portable 1V (09/27/2016) FINDINGS: Portable AP supine view at 1213 hours. An enteric tube courses to the left upper quadrant and appears looped in the stomach. Negative visible bowel gas pattern. Stable visible left lung base. Calcified aortic atherosclerosis. No acute osseous abnormality identified.  DG Chest Port 1 View (09/27/2016) FINDINGS: Cardiomediastinal silhouette is stable. Endotracheal tube in place with tip 2.3 cm above the carina. NG tube in place. Central vascular congestion without convincing pulmonary edema. Again noted elevation of the left hemidiaphragm. Bilateral basilar streaky atelectasis or infiltrate. Surgical clips in left axilla again noted.  IMPRESSION: Central vascular congestion without convincing pulmonary edema. Endotracheal and NG tube in place. Streaky bilateral basilar atelectasis or infiltrate.  CT ANGIO CHEST PE W OR WO CONTRAST (09/25/2016) IMPRESSION: 1. No evidence for pulmonary embolus. 2. Cardiomegaly and a small pericardial effusion are stable without edema to suggest failure. 3. Coronary artery disease. 4. Aortic atherosclerosis. 5. Centrilobular emphysema. Electronically Signed   By:  San Morelle M.D.   On: 09/25/2016 10:47     [place] Bing, DO 10/04/2016, 9:09 AM PGY-1, [place] Intern pager: [phone], text pages welcome

## 2016-10-04 NOTE — Progress Notes (Signed)
PCCM Progress Note  Subjective: Frustrated  Wants to know dc plan No sob Dry nose   Vital signs: BP (!) 127/54 (BP Location: Right Wrist)   Pulse 73   Temp 98.5 F (36.9 C) (Oral)   Resp 18   Ht 5\' 5"  (1.651 m)   Wt 106.1 kg (233 lb 14.4 oz) Comment: a scale  SpO2 91%   BMI 38.92 kg/m   Intake/outpt: I/O last 3 completed shifts: In: 1083 [P.O.:1080; I.V.:3] Out: 0   General: sitting up in chair, no distress Neuro: frustrated, nonfocal Eyes: wears glasses ENT: no stridor Cardiac:  s1 s2 regular, no murmur Chest: decreased BS, no wheeze, good air movement bilateral Abd: soft, non tender Ext: 0.5+ edema Skin: no rashes  CMP Latest Ref Rng & Units 10/03/2016 10/02/2016 10/01/2016  Glucose 65 - 99 mg/dL 115(H) 92 126(H)  BUN 6 - 20 mg/dL 28(H) 28(H) 25(H)  Creatinine 0.44 - 1.00 mg/dL 0.59 0.77 0.74  Sodium 135 - 145 mmol/L 142 144 145  Potassium 3.5 - 5.1 mmol/L 4.8 4.1 3.6  Chloride 101 - 111 mmol/L 101 95(L) 92(L)  CO2 22 - 32 mmol/L 34(H) 41(H) 43(H)  Calcium 8.9 - 10.3 mg/dL 8.8(L) 8.3(L) 8.3(L)  Total Protein 6.5 - 8.1 g/dL - - -  Total Bilirubin 0.3 - 1.2 mg/dL - - -  Alkaline Phos 38 - 126 U/L - - -  AST 15 - 41 U/L - - -  ALT 14 - 54 U/L - - -    CBC Latest Ref Rng & Units 10/03/2016 10/02/2016 10/01/2016  WBC 4.0 - 10.5 K/uL 6.1 5.2 5.3  Hemoglobin 12.0 - 15.0 g/dL 10.9(L) 10.5(L) 10.6(L)  Hematocrit 36.0 - 46.0 % 41.7 38.6 39.7  Platelets 150 - 400 K/uL 158 122(L) 141(L)    ABG    Component Value Date/Time   PHART 7.360 09/27/2016 1206   PCO2ART 72.8 (HH) 09/27/2016 1206   PO2ART 84.4 09/27/2016 1206   HCO3 40.1 (H) 09/27/2016 1206   TCO2 44 09/26/2016 0927   O2SAT 95.9 09/27/2016 1206    CBG (last 3)   Recent Labs  10/02/16 0826  GLUCAP 88     Imaging: No results found.  Studies:  PFT 01/03/14 >> FEV1 0.85 (37%), FEV1% 42, TLC 6.17 (127%), DLCO 48%, +BD Echo 09/25/16 >> EF 65 to 70%, grade 1 DD CT chest 09/25/16 >> CM with small  pericardial effusion, atherosclerosis, centrilobular emphysema CT head 09/26/16 >> microvascular changes  Lines/tubes: ETT 1/14 >> 1/16  I reviewed CXR myself, pulmonary edema noted  Summary: 66 yo female smoker with progressive dyspnea, hypoxia from AECOPD with emphysema  She is followed by Dr. Lake Bells in pulmonary office.  Assessment/plan:  Acute on chronic hypoxic/hypercapnic respiratory failure from AECOPD. Elevated Lt hemidiaphragm. Pulmonary hypertension and grade I diastolic dysfunction Pulmonary edema - Pulmicort - Brovana - Patient does not want to be on incruse or spiriva (concern for cost as outpatient), will d/c - Continue schedule duoneb - Nicotine patch - she needs to lower pred now and taper this over 5-7 days off, she was NOT on pred pre hospital stay -we also need top repeat ambulatory pulse now, ensure she does not need to increase 5-6 liters during exertion -she is frustrated and daughter as they do not know dc place / plan, I will call social worker now Will sign off, follow up Dr Lake Bells in 2 weeks  Lavon Paganini. Titus Mould, MD, [place] Pgr: [facility]

## 2016-10-04 NOTE — Clinical Social Work Placement (Signed)
   CLINICAL SOCIAL WORK PLACEMENT  NOTE  Date:  10/04/2016  Patient Details  Name: [name] MRN: [id number] Date of Birth: 26-Sep-1950  Clinical Social Work is seeking post-discharge placement for this patient at the [place] level of care (*CSW will initial, date and re-position this form in  chart as items are completed):  Yes   Patient/family provided with [place] Work Department's list of facilities offering this level of care within the geographic area requested by the patient (or if unable, by the patient's family).  Yes   Patient/family informed of their freedom to choose among providers that offer the needed level of care, that participate in Medicare, Medicaid or managed care program needed by the patient, have an available bed and are willing to accept the patient.  Yes   Patient/family informed of [place]'s ownership interest in [facility] and [facility], as well as of the fact that they are under no obligation to receive care at these facilities.  PASRR submitted to EDS on 10/04/16     PASRR number received on 10/04/16     Existing PASRR number confirmed on       FL2 transmitted to all facilities in geographic area requested by pt/family on 10/04/16     FL2 transmitted to all facilities within larger geographic area on       Patient informed that his/her managed care company has contracts with or will negotiate with certain facilities, including the following:        Yes   Patient/family informed of bed offers received.  Patient chooses bed at [facility] recommends and patient chooses bed at      Patient to be transferred to [facility] on 10/04/16.  Patient to be transferred to facility by PTAR     Patient family notified on 10/04/16 of transfer.  Name of family member notified:  Oldest daughter Belenda Cruise?)     PHYSICIAN Please prepare prescriptions      Additional Comment:    _______________________________________________ Candie Chroman, LCSW 10/04/2016, 3:04 PM

## 2016-10-04 NOTE — Clinical Social Work Note (Signed)
CSW facilitated patient discharge including contacting patient family and facility to confirm patient discharge plans. Clinical information faxed to facility and family agreeable with plan. CSW arranged ambulance transport via PTAR to [place] around 3:30 pm. RN to call report prior to discharge [phone]).  CSW will sign off for now as social work intervention is no longer needed. Please consult [place] again if new needs arise.  Dayton Scrape, [place]

## 2016-10-04 NOTE — Progress Notes (Signed)
Patient had no acute changes overnight. Nurse completed discharge teaching with patient regarding use of home O2 and the imp[ortance of not smoking while using O2. Smoking cessation information provided to patient. Patient verbalized understanding of teaching. Daughter at bedside states patient is non compliant once she is home and will requires continues education and reinforcement of teaching.

## 2016-10-04 NOTE — NC FL2 (Signed)
[place] LEVEL OF CARE SCREENING TOOL     IDENTIFICATION  Patient Name: [name] Birthdate: [birth year] Sex: female Admission Date (Current Location): 09/24/2016  [facility] and [place] Number:  Herbalist and Address:  [facility] [place] [place] [address] [place], [place] [address]      Provider Number: [id number]  Attending Physician Name and Address:  Zenia Resides, MD  Relative Name and Phone Number:       Current Level of Care: Hospital Recommended Level of Care: [place] Prior Approval Number:    Date Approved/Denied:   PASRR Number: [id number]  Discharge Plan: SNF    Current Diagnoses: Patient Active Problem List   Diagnosis Date Noted  . Acute respiratory failure with hypoxia ([place])   . Chronic obstructive pulmonary disease with acute exacerbation ([place])   . Hypokalemia 09/25/2016  . Respiratory failure, acute ([place]) 09/24/2016  . COPD with emphysema ([place]) 12/31/2015  . Tobacco use disorder 12/29/2015  . Cough 09/12/2015  . Acute on chronic respiratory failure with hypoxia ([place]) 08/09/2015  . Acute cor pulmonale ([place]) 08/09/2015  . Diastolic congestive heart failure ([place])   . Noncompliance with medications   . Hypoalbuminemia 11/23/2011  . Anemia, iron deficiency 10/26/2011  . Hypocalcemia 10/26/2011  . Fatigue 10/26/2011  . Osteoarthritis 01/04/2011  . ALLERGIC RHINITIS CAUSE UNSPECIFIED 08/14/2009  . Morbid obesity ([place]) 08/14/2009  . Hypertension 08/14/2009  . PSORIASIS 12/06/2008  . BREAST CANCER, HX OF 12/06/2008    Orientation RESPIRATION BLADDER Height & Weight     Self, Time, Situation, Place  O2 (Nasal canula 3 L) Continent Weight: 233 lb 14.4 oz (106.1 kg) (a scale) Height:  5\' 5"  (165.1 cm)  BEHAVIORAL SYMPTOMS/MOOD NEUROLOGICAL BOWEL NUTRITION STATUS   (None)  (None) Continent Diet (Heart healthy)  AMBULATORY STATUS COMMUNICATION OF NEEDS Skin   Limited Assist Verbally  Bruising                       Personal Care Assistance Level of Assistance              Functional Limitations Info  Sight, Hearing, Speech Sight Info: Adequate Hearing Info: Adequate Speech Info: Adequate    SPECIAL CARE FACTORS FREQUENCY  PT (By licensed PT), Blood pressure     PT Frequency: 5 x week              Contractures Contractures Info: Not present    Additional Factors Info  Code Status, Allergies, Isolation Precautions Code Status Info: Full code Allergies Info: NKDA     Isolation Precautions Info: Enteric precautions     Current Medications (10/04/2016):  This is the current hospital active medication list Current Facility-Administered Medications  Medication Dose Route Frequency Provider Last Rate Last Dose  . acetaminophen (TYLENOL) tablet 650 mg  650 mg Oral Q6H PRN Tammy S Parrett, NP       Or  . acetaminophen (TYLENOL) suppository 650 mg  650 mg Rectal Q6H PRN Tammy S Parrett, NP      . albuterol (PROVENTIL) (2.5 MG/3ML) 0.083% nebulizer solution 2.5 mg  2.5 mg Nebulization Q2H PRN Tammy S Parrett, NP   2.5 mg at 09/30/16 1135  . arformoterol (BROVANA) nebulizer solution 15 mcg  15 mcg Nebulization BID Chesley Mires, MD   15 mcg at 10/03/16 2057  . aspirin chewable tablet 81 mg  81 mg Oral Daily Chesley Mires, MD   81 mg at 10/04/16  [id number]  . budesonide (PULMICORT) nebulizer solution 0.5 mg  0.5 mg Nebulization BID Chesley Mires, MD   0.5 mg at 10/03/16 2057  . furosemide (LASIX) tablet 20 mg  20 mg Oral Daily Mercy Riding, MD   20 mg at 10/04/16 0841  . guaiFENesin (MUCINEX) 12 hr tablet 1,200 mg  1,200 mg Oral BID Chesley Mires, MD   1,200 mg at 10/04/16 0841  . heparin injection 5,000 Units  5,000 Units Subcutaneous Q8H Tammy S Parrett, NP   5,000 Units at 10/04/16 0604  . ipratropium-albuterol (DUONEB) 0.5-2.5 (3) MG/3ML nebulizer solution 3 mL  3 mL Nebulization BID Zenia Resides, MD   3 mL at 10/03/16 2057  . MEDLINE mouth rinse  15 mL Mouth  Rinse BID Juanito Doom, MD   15 mL at 10/03/16 1019  . nicotine (NICODERM CQ - dosed in mg/24 hr) patch 7 mg  7 mg Transdermal Daily Grace Bushy Minor, NP   7 mg at 10/03/16 1018  . predniSONE (DELTASONE) tablet 50 mg  50 mg Oral Q breakfast Katheren Shams, DO   50 mg at 10/04/16 0841  . senna-docusate (Senokot-S) tablet 1 tablet  1 tablet Oral QHS PRN Melvenia Needles, NP   1 tablet at 09/27/16 2132  . sodium chloride flush (NS) 0.9 % injection 3 mL  3 mL Intravenous Q12H Tammy S Parrett, NP   3 mL at 10/03/16 2302     Discharge Medications: Please see discharge summary for a list of discharge medications.  Relevant Imaging Results:  Relevant Lab Results:   Additional Information SS#: [id number]  Candie Chroman, LCSW

## 2016-10-04 NOTE — Consult Note (Signed)
   [facility] CM Inpatient Consult   10/04/2016  [name] 1951-05-13 [id number]   Patient evaluated for [place] Management services as on the [place] with Medicare.  Chart review reveals the patient is [name] a 66 yo female smoker with progressive dyspnea, hypoxia from AECOPD with emphysema. PMH significant for psoriasis, HTN, OA, HFpEF, h/o breast cancer.  No acute community [facility] Care Management needs noted for home.  This is the patient's 1st admission in 6 months.  Patient is scheduled to discharge to [facility] today.   For questions,   Natividad Brood, RN BSN [facility] Liaison  [phone] business mobile phone Toll free office [phone]

## 2016-10-04 NOTE — Progress Notes (Signed)
Patient has order to discharge to SNF. Discharge instructions reviewed with patient. Patient verbalizes understanding. Prescriptions printed by MD. IV and telemetry removed. Site in good condition. Patient stable and awaiting PTAR. Report given to [facility].

## 2016-10-04 NOTE — Clinical Social Work Placement (Signed)
   CLINICAL SOCIAL WORK PLACEMENT  NOTE  Date:  10/04/2016  Patient Details  Name: [name] MRN: [id number] Date of Birth: Jan 18, 1951  Clinical Social Work is seeking post-discharge placement for this patient at the [place] level of care (*CSW will initial, date and re-position this form in  chart as items are completed):  Yes   Patient/family provided with [place] Work Department's list of facilities offering this level of care within the geographic area requested by the patient (or if unable, by the patient's family).  Yes   Patient/family informed of their freedom to choose among providers that offer the needed level of care, that participate in Medicare, Medicaid or managed care program needed by the patient, have an available bed and are willing to accept the patient.  Yes   Patient/family informed of [place]'s ownership interest in [facility] and [facility], as well as of the fact that they are under no obligation to receive care at these facilities.  PASRR submitted to EDS on 10/04/16     PASRR number received on 10/04/16     Existing PASRR number confirmed on       FL2 transmitted to all facilities in geographic area requested by pt/family on 10/04/16     FL2 transmitted to all facilities within larger geographic area on       Patient informed that his/her managed care company has contracts with or will negotiate with certain facilities, including the following:            Patient/family informed of bed offers received.  Patient chooses bed at       Physician recommends and patient chooses bed at      Patient to be transferred to   on  .  Patient to be transferred to facility by       Patient family notified on   of transfer.  Name of family member notified:        PHYSICIAN Please sign FL2     Additional Comment:    _______________________________________________ Candie Chroman, LCSW 10/04/2016,  10:55 AM

## 2016-10-05 ENCOUNTER — Inpatient Hospital Stay: Payer: Self-pay | Admitting: Family Medicine

## 2016-10-05 ENCOUNTER — Non-Acute Institutional Stay: Payer: Medicare Other | Admitting: Family Medicine

## 2016-10-05 DIAGNOSIS — I5030 Unspecified diastolic (congestive) heart failure: Secondary | ICD-10-CM | POA: Diagnosis not present

## 2016-10-05 DIAGNOSIS — L408 Other psoriasis: Secondary | ICD-10-CM

## 2016-10-05 DIAGNOSIS — F17200 Nicotine dependence, unspecified, uncomplicated: Secondary | ICD-10-CM

## 2016-10-05 DIAGNOSIS — Z593 Problems related to living in residential institution: Secondary | ICD-10-CM

## 2016-10-05 DIAGNOSIS — J9621 Acute and chronic respiratory failure with hypoxia: Secondary | ICD-10-CM | POA: Diagnosis not present

## 2016-10-05 DIAGNOSIS — D509 Iron deficiency anemia, unspecified: Secondary | ICD-10-CM

## 2016-10-05 DIAGNOSIS — J441 Chronic obstructive pulmonary disease with (acute) exacerbation: Secondary | ICD-10-CM | POA: Diagnosis not present

## 2016-10-05 DIAGNOSIS — I2721 Secondary pulmonary arterial hypertension: Secondary | ICD-10-CM

## 2016-10-05 NOTE — Progress Notes (Signed)
[place]  Visit  Primary Care Provider: Ronnie Doss, DO Location of Care: [facility] Visit Information: Admission to [facility] Patient accompanied by patient and daughter Lowell Guitar) of information for visit: patient and relative(s)  Chief Complaint: No chief complaint on file.  Nursing Concerns: none Nutrition Concerns: none Wound Care Nurse Concerns: none PT / OT Concerns: none  HISTORY OF PRESENT ILLNESS: [name] is a 66 y.o. female that was admitted to [facility] on 09/24/16 and discharged 10/04/16.  She was admitted for acute on respiratory failure, presumed to be COPD and CHF exacerbation.  She required direct admission to ICU with intubation 1/13-1/16.  She was diuresed aggressively and treated with oral steroids and IV abx for COPD exacerbation.  Her dry weight is 232#.  She was 233# at discharge.  She is to complete a steroid taper from 10/05/16 to 10/11/16.  Patient's O2 requirement was 3L at discharge.   Outpatient Encounter Prescriptions as of 10/05/2016  Medication Sig  . albuterol (PROVENTIL) (2.5 MG/3ML) 0.083% nebulizer solution Take 3 mLs (2.5 mg total) by nebulization every 2 (two) hours as needed for wheezing.  Marland Kitchen aspirin 81 MG chewable tablet Chew 1 tablet (81 mg total) by mouth daily.  . furosemide (LASIX) 20 MG tablet Take 1 tablet (20 mg total) by mouth daily.  Marland Kitchen guaiFENesin (MUCINEX) 600 MG 12 hr tablet Take 2 tablets (1,200 mg total) by mouth 2 (two) times daily.  Marland Kitchen ipratropium-albuterol (DUONEB) 0.5-2.5 (3) MG/3ML SOLN Take 3 mLs by nebulization 2 (two) times daily.  . mometasone-formoterol (DULERA) 100-5 MCG/ACT AERO Inhale 2 puffs into the lungs 2 (two) times daily.  . Multiple Vitamin (MULTIVITAMIN WITH MINERALS) TABS tablet Take 1 tablet by mouth daily.  . nicotine (NICODERM CQ - DOSED IN MG/24 HR) 7 mg/24hr patch Place 1 patch (7 mg total) onto the skin daily.  . predniSONE (DELTASONE) 10 MG tablet Take 3 tablets (30mg ) in  the morning with food for 2 days. Then take 2 tabs (20mg ) for 2 days. Then take 1 tab (10mg ) for 2 days.  Marland Kitchen tiotropium (SPIRIVA HANDIHALER) 18 MCG inhalation capsule Place 1 capsule (18 mcg total) into inhaler and inhale daily.   No facility-administered encounter medications on file as of 10/05/2016.    No Known Allergies History Patient Active Problem List   Diagnosis Date Noted  . PAH (pulmonary artery hypertension) 10/06/2016  . Acute respiratory failure with hypoxia ([place])   . Chronic obstructive pulmonary disease with acute exacerbation ([place])   . Hypokalemia 09/25/2016  . Respiratory failure, acute ([place]) 09/24/2016  . Other emphysema ([place]) 12/31/2015  . Tobacco use disorder 12/29/2015  . Cough 09/12/2015  . Acute on chronic respiratory failure with hypoxia ([place]) 08/09/2015  . Acute cor pulmonale ([place]) 08/09/2015  . Diastolic congestive heart failure ([place])   . Noncompliance with medications   . Hypoalbuminemia 11/23/2011  . Anemia, iron deficiency 10/26/2011  . Hypocalcemia 10/26/2011  . Fatigue 10/26/2011  . Osteoarthritis 01/04/2011  . ALLERGIC RHINITIS CAUSE UNSPECIFIED 08/14/2009  . Morbid obesity ([place]) 08/14/2009  . Hypertension 08/14/2009  . PSORIASIS 12/06/2008  . BREAST CANCER, HX OF 12/06/2008   Past Medical History:  Diagnosis Date  . Abnormal weight gain 08/14/2009   Qualifier: Diagnosis of  By: Lindell Noe MD, Jeneen Rinks    . Acute respiratory failure ([place])   . Allergic rhinitis   . Anemia   . Breast cancer [facility])    s/p radiation and lumpectomy.  . CAP (community acquired pneumonia) 12/2013  .  Chronic lower back pain   . COPD (chronic obstructive pulmonary disease) ([place])   . DVT (deep venous thrombosis) ([place]) ~ 2004   LLE  . Edema extremities    lower  . Fatigue   . Hypertension    "only when I get stressed" (09/24/2016)  . Hypoalbuminemia   . Hypocalcemia   . Macular degeneration   . Osteoarthritis   . Psoriasis   . Tobacco abuse    Past Surgical History:   Procedure Laterality Date  . APPENDECTOMY    . BREAST LUMPECTOMY Left 1997  . JOINT REPLACEMENT    . KNEE ARTHROSCOPY Left    X2 ("before the replacement)  . ROUX-EN-Y GASTRIC BYPASS  May 2004  . TONSILLECTOMY    . TOTAL KNEE ARTHROPLASTY Left 1995  . TUBAL LIGATION     Family History  Problem Relation Age of Onset  . Osteoarthritis Mother   . Hypertension Mother   . Thyroid disease Mother   . Heart disease Father   . Cancer Father 29    prostate ca  . Diabetes Daughter     reports that she quit smoking about 2 years ago. Her smoking use included Cigarettes. She has a 60.00 pack-year smoking history. She has never used smokeless tobacco. She reports that she drinks alcohol. She reports that she does not use drugs.  Basic Activities of Daily Living   ADLs Independent Needs Assistance Dependent  Bathing x    Dressing x    Ambulation  occ w. Ambulation up stairs 2/2 back pain   Toileting x    Eating x       Instrumental Activities of Daily Living  IADL Independent Needs Assistance Dependent  Cooking x    Housework x    Manage Medications x    Manage the telephone x    Shopping for food, clothes, Meds, etc x    Use transportation x    Manage Finances x      Falls in the past six months:   no  Diet:  low sodium Supplemental shakes:  no  Review of Systems  Patient has ability to communicate answers to ROS: yes See HPI  Geriatric Syndromes: Constipation no ,   Incontinence yes, urge incontinence; uses pads  Dizziness no   Syncope no   Skin problems h/o psoriasis.  Has bruising from recent hospitalization/ frequent blood draws   Visual Impairment: wears corrective lenses, recently replaced 08/2016   Hearing impairment no  Eating impairment no  Impaired Memory or Cognition no   Behavioral problems no   Sleep problems sleeps only 3-5 hours per night w/ frequent nocturia   Weight loss after diuresis but no unplanned weight loss    Pain:  Pain Location:  Back Pain Rating: She rates her pain as a 10 on a scale of 0-10.  Pain Duration: chronic Pain Therapies: NSAID and has tried back injections in past (steroid and facet blocks).  Naproxen 500mg  twice weekly helps relieve pain Pain Response to Therapies: Location: back; Severity: 9/10 Bowel Movement Difficulty: no  Dyspnea: Dyspnea Rating: none with 3L [place] in place Dyspnea Goal: none Dyspnea Therapies: steroid taper, Spiriva, Dulera, Duoneb scheduled BID; furosemide (LASIX) 20 mg every day Dyspnea Response to Therapies: good   General: Denies fevers, chills, unplanned weight loss, fatigue Eyes: Denies pain, blurred vision (unless does not have glasses on) Ears/Nose/Throat: Denies ear pain, throat pain, rhinorrhea, nasal congestion.  Cardiovascular: Denies chest pains, palpitations, dyspnea on exertion, orthopnea, peripheral edema.  Respiratory: Endorses productive cough w/ clear sputum. Denies dyspnea om 3L O2.  Gastrointestinal: Denies abd pain, bloating, constipation, diarrhea.  Genitourinary: Denies dysuria, urinary frequency, discharge.  Endorses nocturia. Musculoskeletal: Denies joint pain, swelling, weakness.  Skin: Denies skin rash or ulcers.  Reports bruising. Neurologic: Denies transient paralysis, weakness, paresthesias, headache.  Psychiatric: Denies depression, anxiety, psychosis. Endocrine: Denies unplanned weight change.   PHYSICAL EXAM:. Wt Readings from Last 3 Encounters:  10/06/16 237 lb (107.5 kg)  10/04/16 233 lb 14.4 oz (106.1 kg)  09/24/16 253 lb 12.8 oz (115.1 kg)   Temp Readings from Last 3 Encounters:  10/06/16 98.2 F (36.8 C)  10/04/16 98.5 F (36.9 C) (Oral)  12/29/15 98.4 F (36.9 C) (Oral)   BP Readings from Last 3 Encounters:  10/06/16 122/60  10/04/16 (!) 127/54  09/24/16 138/60   Pulse Readings from Last 3 Encounters:  10/06/16 76  10/04/16 73  09/24/16 63    General: alert, cooperative, no distress, appears stated age, mildly obese,  well nourished, pleasant, clean, groomed HEENT:  No scleral icterus, no nasal secretions, [place] moist and no erythema or lesion Neck:  Supple, No JVD, no lymphadenopathy CV:  RRR, no murmur, no ankle swelling RESP: No resp distress or accessory muscle use.  Clear to ausc bilat. No wheezing, no rales, no rhonchi. Breathing normally on 3L [place]. ABD:  Obese, Soft, Non-tender, non-distended, +bowel sounds, no masses MSK:  +TTP to L4-5 paraspinal region, no joint pain.  No joint swelling or redness EXT: Warm and well perfused   no edema, no erythema, pulses WNL Gait:  Normal Skin: bruising along bilateral UE  Neurologic: EOMI, Muscle Tone within normal limits; no focal deficits Psych:  Orientation oriented to person, place, time, and general circumstances; [place], [place] Memory recent and remote memory intact; Attention Normal;  Mood appropriate and somewhat agitated intermittently when talking about CPAP machines and recent hospitalization; Speech normal; Language none ; Thought Coherent  No flowsheet data found. Montreal Cognitive Assessment  10/06/2016  Visuospatial/ Executive (0/5) 4  Naming (0/3) 3  Attention: Read list of digits (0/2) 2  Attention: Read list of letters (0/1) 1  Attention: Serial 7 subtraction starting at 100 (0/3) 3  Language: Repeat phrase (0/2) 2  Language : Fluency (0/1) 1  Abstraction (0/2) 2  Delayed Recall (0/5) 5  Orientation (0/6) 6  Total 29  Adjusted Score (based on education) 29    MiniCog: 5 MMSE or MoCa:  38 / 30 Years of Education: 12 +  Assessment and Plan:   Person living in residential institution - Admit to [place] [facility] - PT/OT eval and treat - FULL code  Chronic obstructive pulmonary disease with acute exacerbation ([place]) Currently on 6 day Prednisone taper.  Has Spiriva, Dulera, Duonebs.  Dr Valentina Lucks to assist with samples.  Continue O2 via .  Patient to follow up with Dr Lake Bells 10/19/16.  Order has been placed for outpatient  sleep study at discharge.  Will cc: Ander Purpura, RN to assist with this as well.  PSORIASIS Stable.  Anemia, iron deficiency Last hgb 10.9.  Not currently on Iron.  Will continue to monitor and supplement if needed.  Diastolic congestive heart failure (HCC) Currently on Lasix 20mg  daily.  Dry weight is about 232 lbs.  I suspect that there is some variance in the hospital scales and SNF scales.  Will monitor from SNF weights.  Plan for repeat BMP 1/29.  Order has been placed for this.  PAH (pulmonary artery  hypertension) Patient followed by Dr Lake Bells.  Next visit on 2/6.  Suspect that [place] 2/2 undiagnosed OSA/ OHS.  Patient an active smoker.  Currently requiring 3L O2  Tobacco use disorder Patient in ACTION phase.  She is currently using a Nicotine patch 7mg  daily.  Family communication: Daughter Olivia Mackie updated at bedside  Advanced Directives (MOST form, Living Will, HCPOA): Patient has living will but reports it is being updated.  No HPOA. Code Status:    FULL Intubation Status: yes Intravenous Fluids:  yes Feeding Tubes: yes Antibiotics: yes Hospitalization: yes Emergency contact:  Giordana Heinkel [phone]; Kathrene Bongo (daughter) [phone]   Follow Up:  Next 7 days unless acute issues arise.    Meckenzie Balsley M. Lajuana Ripple, DO PGY-3, [facility]

## 2016-10-06 ENCOUNTER — Encounter: Payer: Self-pay | Admitting: Family Medicine

## 2016-10-06 DIAGNOSIS — I2721 Secondary pulmonary arterial hypertension: Secondary | ICD-10-CM | POA: Insufficient documentation

## 2016-10-06 NOTE — Assessment & Plan Note (Signed)
Patient in ACTION phase.  She is currently using a Nicotine patch 7mg  daily.

## 2016-10-06 NOTE — Assessment & Plan Note (Signed)
Last hgb 10.9.  Not currently on Iron.  Will continue to monitor and supplement if needed.

## 2016-10-06 NOTE — Assessment & Plan Note (Signed)
Currently on Lasix 20mg  daily.  Dry weight is about 232 lbs.  I suspect that there is some variance in the hospital scales and SNF scales.  Will monitor from SNF weights.  Plan for repeat BMP 1/29.  Order has been placed for this.

## 2016-10-06 NOTE — Assessment & Plan Note (Signed)
Currently on 6 day Prednisone taper.  Has Spiriva, Dulera, Duonebs.  Dr Valentina Lucks to assist with samples.  Continue O2 via [place].  Patient to follow up with Dr Lake Bells 10/19/16.  Order has been placed for outpatient sleep study at discharge.  Will cc: Ander Purpura, RN to assist with this as well.

## 2016-10-06 NOTE — Assessment & Plan Note (Signed)
Patient followed by Dr Lake Bells.  Next visit on 2/6.  Suspect that [place] 2/2 undiagnosed OSA/ OHS.  Patient an active smoker.  Currently requiring 3L O2

## 2016-10-06 NOTE — Assessment & Plan Note (Signed)
Stable

## 2016-10-07 MED ORDER — OXYCODONE HCL 5 MG PO TABS: 5.0000 mg | ORAL_TABLET | ORAL | 0 refills | Status: DC | PRN

## 2016-10-07 NOTE — Progress Notes (Signed)
I have interviewed and examined the patient.  I have discussed the case and verified the key findings with Dr. Lajuana Ripple.   I agree with their assessments and plans as documented in their visit note.  Rx oxycodone 5 mg every 4 hours as needed for breakthru back pain to facilitate working with PT.  Oxycodone is only for ANF stay and is not for discharge back to community.

## 2016-10-12 ENCOUNTER — Other Ambulatory Visit: Payer: Self-pay | Admitting: *Deleted

## 2016-10-12 NOTE — Patient Outreach (Signed)
Call to [facility], SW at facility. RNCM discussed that patient is eligible for [facility] care management services and no needs were identified while in patient. She states at this time she agrees with this assessment.   Requested that [place] know of any [facility] Care management discharge planning needs that may arise, she agrees to plan.   Plan to sign off case at this time, can be consulted again if new needs are identified.  [name]. Laymond Purser, RN, BSN, [place] [phone]) Business Cell  [phone]) Toll Free Office

## 2016-10-19 ENCOUNTER — Encounter: Payer: Self-pay | Admitting: Adult Health

## 2016-10-19 ENCOUNTER — Ambulatory Visit (INDEPENDENT_AMBULATORY_CARE_PROVIDER_SITE_OTHER): Payer: Medicare Other | Admitting: Adult Health

## 2016-10-19 VITALS — BP 116/64 | HR 85 | Ht 65.0 in | Wt 239.4 lb

## 2016-10-19 DIAGNOSIS — I5032 Chronic diastolic (congestive) heart failure: Secondary | ICD-10-CM

## 2016-10-19 DIAGNOSIS — G4719 Other hypersomnia: Secondary | ICD-10-CM | POA: Diagnosis not present

## 2016-10-19 DIAGNOSIS — J9611 Chronic respiratory failure with hypoxia: Secondary | ICD-10-CM

## 2016-10-19 DIAGNOSIS — J441 Chronic obstructive pulmonary disease with (acute) exacerbation: Secondary | ICD-10-CM | POA: Diagnosis not present

## 2016-10-19 DIAGNOSIS — R0689 Other abnormalities of breathing: Secondary | ICD-10-CM | POA: Diagnosis not present

## 2016-10-19 DIAGNOSIS — I2721 Secondary pulmonary arterial hypertension: Secondary | ICD-10-CM

## 2016-10-19 MED ORDER — TIOTROPIUM BROMIDE MONOHYDRATE 18 MCG IN CAPS: 18.0000 ug | ORAL_CAPSULE | Freq: Every day | RESPIRATORY_TRACT | 3 refills | Status: DC

## 2016-10-19 NOTE — Patient Instructions (Addendum)
Set up for sleep study.  Great job on not smoking  Continue on [place] and Spiriva  Pt assistance program for [employer] .  Follow up with .Dr. Lake Bells in 4-6 weeks and As needed   Please contact office for sooner follow up if symptoms do not improve or worsen or seek emergency care

## 2016-10-19 NOTE — Progress Notes (Signed)
@Patient  ID: [name], female    DOB: 02/16/1951, 66 y.o.   MRN: [id number]  Chief Complaint  Patient presents with  . Follow-up    COPD     Referring provider: Janora Norlander, DO  HPI: 66 yo female smoker followed for Moderate COPD  H/o Breast Cancer , Psoriasis ,   TEST  2015 PFT>FEV1 54%, ratio 46, FVC 91%, ++BD response (44%) , DLCO 48% Echo 07/2015 EF 55-60%, gr 1 DD . PAP 65 , LA mod dila./Mild RA dilation  07/2015 CTa Chest > Diffuse Emphysema 09/2016 CTa Chest >no PE, , CM , small pericardial effusion,  Emphysema Echo 09/2106 > EF [id number], grade 1 diastolic dysfunction, left atrium, moderate dilation, tricuspid valve moderate regurgitation, pulmonary artery pressure 54 mmHg   10/19/16 Post hospital follow up  Patient presents for a post hospital follow-up. Patient was admitted month for acute on chronic hypoxic and hypercarbic respiratory failure secondary to COPD exacerbation and CHF decompensation. Patient was admitted from the office with severe hypoxemia. CT chest, and she was negative for pulmonary embolism. Patient continued to deteriorate after admission with worsening hypercarbia requiring a intubation with vent support. He was treated with IV antibiotics, steroids and nebulized bronchodilators.. Repeat echo showed normal EF, grade 1 diastolic dysfunction and persistent pulmonary hypertension pulmonary artery pressures were down slightly at 54 mmHg. Patient required aggressive diuresis with 22 pound decrease. Patient was discharged to rehabilitation. She was discharged on oxygen at 3 L..   Patient is accompanied by her daughter has multiple questions about her care. Unfortunately, she was not pleased with her hospitalization. Questions were answered as best possible. Have encouraged her on smoking cessation. However, she feels that smoking is not her issue.. She does have difficulty affording her medications. She is currently get [facility] through patient assistance.  We have filled out paperwork for Spiriva today.   No Known Allergies  Immunization History  Administered Date(s) Administered  . Pneumococcal Polysaccharide-23 12/25/2013    Past Medical History:  Diagnosis Date  . Abnormal weight gain 08/14/2009   Qualifier: Diagnosis of  By: Lindell Noe MD, Jeneen Rinks    . Acute respiratory failure ([place])   . Allergic rhinitis   . Anemia   . Breast cancer [facility])    s/p radiation and lumpectomy.  . CAP (community acquired pneumonia) 12/2013  . Chronic lower back pain   . COPD (chronic obstructive pulmonary disease) ([place])   . DVT (deep venous thrombosis) ([place]) ~ 2004   LLE  . Edema extremities    lower  . Fatigue   . Hypertension    "only when I get stressed" (09/24/2016)  . Hypoalbuminemia   . Hypocalcemia   . Macular degeneration   . Osteoarthritis   . Psoriasis   . Tobacco abuse     Tobacco History: History  Smoking Status  . Former Smoker  . Packs/day: 1.50  . Years: 40.00  . Types: Cigarettes  . Quit date: 12/15/2013  Smokeless Tobacco  . Never Used   Counseling given: Not Answered   Outpatient Encounter Prescriptions as of 10/19/2016  Medication Sig  . albuterol (PROVENTIL) (2.5 MG/3ML) 0.083% nebulizer solution Take 3 mLs (2.5 mg total) by nebulization every 2 (two) hours as needed for wheezing.  Marland Kitchen aspirin 81 MG chewable tablet Chew 1 tablet (81 mg total) by mouth daily.  . furosemide (LASIX) 20 MG tablet Take 1 tablet (20 mg total) by mouth daily.  Marland Kitchen guaiFENesin (MUCINEX) 600 MG 12 hr tablet Take 2  tablets (1,200 mg total) by mouth 2 (two) times daily.  Marland Kitchen ipratropium-albuterol (DUONEB) 0.5-2.5 (3) MG/3ML SOLN Take 3 mLs by nebulization 2 (two) times daily.  . mometasone-formoterol (DULERA) 100-5 MCG/ACT AERO Inhale 2 puffs into the lungs 2 (two) times daily.  . Multiple Vitamin (MULTIVITAMIN WITH MINERALS) TABS tablet Take 1 tablet by mouth daily.  . nicotine (NICODERM CQ - DOSED IN MG/24 HR) 7 mg/24hr patch Place 1 patch (7 mg total)  onto the skin daily.  Marland Kitchen tiotropium (SPIRIVA HANDIHALER) 18 MCG inhalation capsule Place 1 capsule (18 mcg total) into inhaler and inhale daily.  . [DISCONTINUED] oxyCODONE (ROXICODONE) 5 MG immediate release tablet Take 1 tablet (5 mg total) by mouth every 4 (four) hours as needed for breakthrough pain.  . [DISCONTINUED] tiotropium (SPIRIVA HANDIHALER) 18 MCG inhalation capsule Place 1 capsule (18 mcg total) into inhaler and inhale daily.  . [DISCONTINUED] tiotropium (SPIRIVA HANDIHALER) 18 MCG inhalation capsule Place 1 capsule (18 mcg total) into inhaler and inhale daily.  . [DISCONTINUED] predniSONE (DELTASONE) 10 MG tablet Take 3 tablets (30mg ) in the morning with food for 2 days. Then take 2 tabs (20mg ) for 2 days. Then take 1 tab (10mg ) for 2 days. (Patient not taking: Reported on 10/19/2016)   No facility-administered encounter medications on file as of 10/19/2016.      Review of Systems  Constitutional:   No  weight loss, night sweats,  Fevers, chills, + fatigue, or  lassitude.  HEENT:   No headaches,  Difficulty swallowing,  Tooth/dental problems, or  Sore throat,                No sneezing, itching, ear ache, nasal congestion, post nasal drip,   CV:  No chest pain,  Orthopnea, PND, swelling in lower extremities, anasarca, dizziness, palpitations, syncope.   GI  No heartburn, indigestion, abdominal pain, nausea, vomiting, diarrhea, change in bowel habits, loss of appetite, bloody stools.   Resp:    No wheezing.  No chest wall deformity  Skin: no rash or lesions.  GU: no dysuria, change in color of urine, no urgency or frequency.  No flank pain, no hematuria   MS:  No joint pain or swelling.  No decreased range of motion.  No back pain.    Physical Exam  BP 116/64 (BP Location: Right Arm, Cuff Size: Normal)   Pulse 85   Ht 5\' 5"  (1.651 m)   Wt 239 lb 6.4 oz (108.6 kg)   SpO2 92%   BMI 39.84 kg/m   GEN: A/Ox3; pleasant , NAD,elderly and chronically ill appearing      HEENT:  [place]/AT,  EACs-clear, TMs-wnl, NOSE-clear, THROAT-clear, no lesions, no postnasal drip or exudate noted.   NECK:  Supple w/ fair ROM; no JVD; normal carotid impulses w/o bruits; no thyromegaly or nodules palpated; no lymphadenopathy.    RESP  Decreased BS in bases ,  no accessory muscle use, no dullness to percussion  CARD:  RRR, no m/r/g, no peripheral edema, pulses intact, no cyanosis or clubbing.  GI:   Soft & nt; nml bowel sounds; no organomegaly or masses detected.   Musco: Warm bil, no deformities or joint swelling noted.   Neuro: alert, no focal deficits noted.    Skin: Warm, no lesions or rashes     Lab Results:  CBC    Component Value Date/Time   WBC 6.1 10/03/2016 0313   RBC 4.65 10/03/2016 0313   HGB 10.9 (L) 10/03/2016 0313   HCT 41.7  10/03/2016 0313   PLT 158 10/03/2016 0313   MCV 89.7 10/03/2016 0313   MCH 23.4 (L) 10/03/2016 0313   MCHC 26.1 (L) 10/03/2016 0313   RDW 16.5 (H) 10/03/2016 0313   LYMPHSABS 1.0 09/30/2016 0433   MONOABS 0.4 09/30/2016 0433   EOSABS 0.2 09/30/2016 0433   BASOSABS 0.0 09/30/2016 0433    BMET    Component Value Date/Time   NA 142 10/11/2016 0600   K 4.6 10/11/2016 0600   CL 98 10/11/2016 0600   CO2 28 10/11/2016 0600   GLUCOSE 64 10/11/2016 0600   BUN 13.1 10/11/2016 0600   CREATININE 0.59 10/03/2016 0313   CREATININE 0.58 10/26/2011 0959   CALCIUM 9.2 10/11/2016 0600   CALCIUM 8.2 (L) 10/26/2011 0959   GFRNONAA >60 10/03/2016 0313   GFRAA >60 10/03/2016 0313    BNP    Component Value Date/Time   BNP 384.6 (H) 09/24/2016 2001   BNP 361.0 (H) 10/19/2011 1710    ProBNP    Component Value Date/Time   PROBNP 10,387.0 (H) 12/23/2013 2145    Imaging: X-ray Chest Pa And Lateral  Result Date: 09/24/2016 CLINICAL DATA:  Increased shortness of breath and hypoxia EXAM: CHEST  2 VIEW COMPARISON:  07/20/2015 FINDINGS: Tiny CP angle blunting on the lateral image consistent with tiny effusion. No focal  consolidation. Elevated left diaphragm. Cardiomegaly with central vascular congestion. Mild diffuse interstitial prominence may reflect mild edema. Atherosclerosis. No pneumothorax. Surgical clips left axilla. IMPRESSION: 1. Tiny CP angle effusion 2. Cardiomegaly with central vascular congestion and mild diffuse interstitial opacities suggestive of mild edema Electronically Signed   By: Donavan Foil M.D.   On: 09/24/2016 22:20   Ct Angio Chest Pe W Or Wo Contrast  Result Date: 09/25/2016 CLINICAL DATA:  Shortness of breath.  Chest pain. EXAM: CT ANGIOGRAPHY CHEST WITH CONTRAST TECHNIQUE: Multidetector CT imaging of the chest was performed using the standard protocol during bolus administration of intravenous contrast. Multiplanar CT image reconstructions and MIPs were obtained to evaluate the vascular anatomy. CONTRAST:  80 mL Isovue 370 COMPARISON:  Two-view chest x-ray 09/24/2016.  CTA chest 09/17/14 FINDINGS: Cardiovascular: The heart is mildly enlarged. A small pericardial effusion is present. Coronary artery calcifications are present. Atherosclerotic calcifications are also present at the aortic arch and origins the great vessels without significant focal stenosis. Pulmonary arterial opacification is excellent. No focal filling defects to suggest pulmonary emboli. Mediastinum/Nodes: Peritracheal lymph nodes measure 1.3 cm in short access. Smaller prevascular and AP window nodes are present. Similar nodes were present on the prior exam. No significant axillary adenopathy is present. Lungs/Pleura: Moderate centrilobular emphysema is present. There is no focal nodule, mass, or airspace disease. No significant pleural effusion is present. Upper Abdomen: Gastric bypass surgery is noted. The upper abdomen is otherwise unremarkable. Musculoskeletal: Multilevel degenerative changes are present in the thoracic spine. Rightward curvature is stable. No focal lytic or blastic lesions are present. Review of the MIP  images confirms the above findings. IMPRESSION: 1. No evidence for pulmonary embolus. 2. Cardiomegaly and a small pericardial effusion are stable without edema to suggest failure. 3. Coronary artery disease. 4. Aortic atherosclerosis. 5. Centrilobular emphysema. Electronically Signed   By: San Morelle M.D.   On: 09/25/2016 10:47   Dg Chest Port 1 View  Addendum Date: 09/30/2016   ADDENDUM REPORT: 09/30/2016 07:17 ADDENDUM: The impression above should read: 1. Interval removal of endotracheal tube and NG tube. No pneumothorax. 2. Cardiomegaly with pulmonary vascular prominence and mild basilar  interstitial prominence suggesting mild CHF. 3. Basilar atelectasis. Stable elevation left hemidiaphragm. Electronically Signed   By: Marcello Moores  Register   On: 09/30/2016 07:17   Result Date: 09/30/2016 CLINICAL DATA:  Respiratory distress. EXAM: PORTABLE CHEST 1 VIEW COMPARISON:  09/27/2016. FINDINGS: Mediastinum hilar structures are normal. Cardiomegaly with pulmonary vascular prominence and mild interstitial prominence suggesting mild CHF. Mild basilar atelectasis. Stable elevation left hemidiaphragm. Surgical clips left axilla. IMPRESSION: 1. Lines and tubes in stable position. 2. Cardiomegaly with pulmonary vascular prominence and mild basilar interstitial prominence suggesting mild CHF. 3.  Basilar atelectasis.  Stable elevation left hemidiaphragm . Electronically Signed: By: Marcello Moores  Register On: 09/30/2016 07:12   Dg Chest Port 1 View  Result Date: 09/27/2016 CLINICAL DATA:  Respiratory failure, intubated EXAM: PORTABLE CHEST 1 VIEW COMPARISON:  09/26/2016 FINDINGS: Cardiomediastinal silhouette is stable. Endotracheal tube in place with tip 2.3 cm above the carina. NG tube in place. Central vascular congestion without convincing pulmonary edema. Again noted elevation of the left hemidiaphragm. Bilateral basilar streaky atelectasis or infiltrate. Surgical clips in left axilla again noted. IMPRESSION:  Central vascular congestion without convincing pulmonary edema. Endotracheal and NG tube in place. Streaky bilateral basilar atelectasis or infiltrate. Electronically Signed   By: Lahoma Crocker M.D.   On: 09/27/2016 08:04   Portable Chest Xray  Result Date: 09/26/2016 CLINICAL DATA:  Intubated.  Respiratory failure. EXAM: PORTABLE CHEST 1 VIEW COMPARISON:  Earlier today. FINDINGS: Endotracheal tube tip 3.7 cm above the carina. Stable enlarged cardiac silhouette, aortic arch calcifications and prominence of the pulmonary vasculature and interstitial markings. Left axillary surgical clips are again demonstrated. Unremarkable bones. IMPRESSION: 1. Endotracheal tube in satisfactory position. 2. Stable cardiomegaly, pulmonary vascular congestion, chronic interstitial lung disease with possible mild interstitial pulmonary edema and aortic atherosclerosis. Electronically Signed   By: Claudie Revering M.D.   On: 09/26/2016 08:57   Dg Chest Port 1 View  Result Date: 09/26/2016 CLINICAL DATA:  Acute respiratory distress. EXAM: PORTABLE CHEST 1 VIEW COMPARISON:  09/24/2016 and chest CTA dated 09/25/2016. FINDINGS: Stable enlarged cardiac silhouette and prominent pulmonary vasculature and interstitial markings. Left axillary surgical clips are again demonstrated. Aortic arch calcifications. Unremarkable bones. IMPRESSION: 1. Stable cardiomegaly and pulmonary vascular congestion. 2. Stable chronic interstitial lung disease with possible mild interstitial pulmonary edema. 3. Aortic atherosclerosis. Electronically Signed   By: Claudie Revering M.D.   On: 09/26/2016 07:35   Dg Abd Portable 1v  Result Date: 09/27/2016 CLINICAL DATA:  66 year old female OG tube placement. Initial encounter. EXAM: PORTABLE ABDOMEN - 1 VIEW COMPARISON:  Abdominal series 07/19/15. Portable chest radiograph 0624 hours today. FINDINGS: Portable AP supine view at 1213 hours. An enteric tube courses to the left upper quadrant and appears looped in the  stomach. Negative visible bowel gas pattern. Stable visible left lung base. Calcified aortic atherosclerosis. No acute osseous abnormality identified. IMPRESSION: 1. Enteric tube looped in the proximal stomach. 2. Normal visible bowel gas pattern. 3.  Calcified aortic atherosclerosis. Electronically Signed   By: Genevie Ann M.D.   On: 09/27/2016 12:30   Ct Head Code Stroke Wo Contrast  Result Date: 09/26/2016 CLINICAL DATA:  Code stroke.  Unresponsive patient. EXAM: CT HEAD WITHOUT CONTRAST TECHNIQUE: Contiguous axial images were obtained from the base of the skull through the vertex without intravenous contrast. COMPARISON:  None. FINDINGS: Brain: No mass lesion, intraparenchymal hemorrhage or extra-axial collection. No evidence of acute cortical infarct. There is periventricular hypoattenuation compatible with chronic microvascular disease. Vascular: No hyperdense vessel or unexpected  calcification. Skull: Normal visualized skull base, calvarium and extracranial soft tissues. Sinuses/Orbits: No sinus fluid levels or advanced mucosal thickening. No mastoid effusion. Normal orbits. ASPECTS [facility] Stroke Program Early CT Score) - Ganglionic level infarction (caudate, lentiform nuclei, internal capsule, insula, M1-M3 cortex): 7 - Supraganglionic infarction (M4-M6 cortex): 3 Total score (0-10 with 10 being normal): 10 IMPRESSION: 1. Chronic microvascular ischemia without acute intracranial abnormality. 2. ASPECTS is 10. These results were called by telephone at the time of interpretation on 09/26/2016 at 6:06 am to Dr. Germain Osgood , who verbally acknowledged these results. Electronically Signed   By: Ulyses Jarred M.D.   On: 09/26/2016 06:06     Assessment & Plan:   Diastolic congestive heart failure (HCC) Recent decompensation improved with diuresis  Cont on O2 and lasix   Chronic obstructive pulmonary disease with acute exacerbation (HCC) Recent severe flare w/ intubation/vent support - Smoking  cessation  Cont on meds  rx papers filled out for pt assitance for Spiriva   Plan  Patient Instructions  Set up for sleep study.  Great job on not smoking  Continue on [place] and Spiriva  Pt assistance program for [employer] .  Follow up with .Dr. Lake Bells in 4-6 weeks and As needed   Please contact office for sooner follow up if symptoms do not improve or worsen or seek emergency care         [place] (pulmonary artery hypertension) Most likely from COPD/DCHF  Need to r/o OSA  Set up for sleep study  Cont on o2 and lasix.   Chronic respiratory failure (HCC) Hypoxic and hypercarbic RF - Check sleep study  Cont on O2  Smoking cessation       Rexene Edison, NP 10/21/2016

## 2016-10-20 ENCOUNTER — Other Ambulatory Visit: Payer: Self-pay | Admitting: Family Medicine

## 2016-10-20 ENCOUNTER — Encounter: Payer: Self-pay | Admitting: Family Medicine

## 2016-10-20 LAB — BASIC METABOLIC PANEL
BUN/Creatinine Ratio: 24.7
BUN: 13.1
Calcium: 9.2 mg/dL
Carbon Dioxide, Total: 28
Chloride: 98 mmol/L
Creatine, Serum: 0.53
Glucose, Bld: 64
Osmolality: 281.4
Potassium: 4.6 mmol/L
Sodium: 142

## 2016-10-20 MED ORDER — OXYCODONE HCL 5 MG PO TABS: 5.0000 mg | ORAL_TABLET | Freq: Three times a day (TID) | ORAL | 0 refills | Status: DC | PRN

## 2016-10-20 NOTE — Progress Notes (Signed)
[place] narcotic database reviewed. No red flags. Patient currently at [facility], plan for dc on Friday.  Oxycodone currently helping with breakthrough back pain not relieved by Naprosyn.  Discussed with patient at length that I am willing to prescribe this in the short term since she has been working with PT fairly often and her back is in flare.  However, this will NOT be a long term medication.  She voices good understanding.  Meds ordered this encounter  Medications  . oxyCODONE (ROXICODONE) 5 MG immediate release tablet    Sig: Take 1 tablet (5 mg total) by mouth every 8 (eight) hours as needed for breakthrough pain.    Dispense:  15 tablet    Refill:  0   Tawonna Esquer M. Lajuana Ripple, DO PGY-3, [facility]

## 2016-10-21 ENCOUNTER — Telehealth: Payer: Self-pay

## 2016-10-21 NOTE — Assessment & Plan Note (Signed)
Most likely from COPD/DCHF  Need to r/o OSA  Set up for sleep study  Cont on o2 and lasix.

## 2016-10-21 NOTE — Assessment & Plan Note (Signed)
Recent severe flare w/ intubation/vent support - Smoking cessation  Cont on meds  rx papers filled out for pt assitance for Spiriva   Plan  Patient Instructions  Set up for sleep study.  Great job on not smoking  Continue on [place] and Spiriva  Pt assistance program for [employer] .  Follow up with .Dr. Lake Bells in 4-6 weeks and As needed   Please contact office for sooner follow up if symptoms do not improve or worsen or seek emergency care

## 2016-10-21 NOTE — Assessment & Plan Note (Signed)
Recent decompensation improved with diuresis  Cont on O2 and lasix

## 2016-10-21 NOTE — Telephone Encounter (Signed)
Pt given:  1 sample box of  Incruse 62.5 mcg NDC [phone] LOT [id number] Exp 02/10/2018  2 sample boxes of  [facility] NDC [id number] Lot [id number] Exp 04/20/2017  Dr. Zoila Shutter  Ottis Stain, CMA

## 2016-10-21 NOTE — Assessment & Plan Note (Signed)
Hypoxic and hypercarbic RF - Check sleep study  Cont on O2  Smoking cessation

## 2016-10-22 ENCOUNTER — Non-Acute Institutional Stay (INDEPENDENT_AMBULATORY_CARE_PROVIDER_SITE_OTHER): Payer: Medicare Other | Admitting: Family Medicine

## 2016-10-22 ENCOUNTER — Encounter: Payer: Self-pay | Admitting: Family Medicine

## 2016-10-22 DIAGNOSIS — I2721 Secondary pulmonary arterial hypertension: Secondary | ICD-10-CM

## 2016-10-22 DIAGNOSIS — J9601 Acute respiratory failure with hypoxia: Secondary | ICD-10-CM

## 2016-10-22 DIAGNOSIS — Z9114 Patient's other noncompliance with medication regimen: Secondary | ICD-10-CM

## 2016-10-22 DIAGNOSIS — J441 Chronic obstructive pulmonary disease with (acute) exacerbation: Secondary | ICD-10-CM

## 2016-10-22 DIAGNOSIS — M47817 Spondylosis without myelopathy or radiculopathy, lumbosacral region: Secondary | ICD-10-CM

## 2016-10-22 DIAGNOSIS — F17200 Nicotine dependence, unspecified, uncomplicated: Secondary | ICD-10-CM

## 2016-10-22 MED ORDER — NICOTINE 7 MG/24HR TD PT24: 7.0000 mg | MEDICATED_PATCH | Freq: Every day | TRANSDERMAL | 0 refills | Status: DC

## 2016-10-22 MED ORDER — UMECLIDINIUM BROMIDE 62.5 MCG/INH IN AEPB: 1.0000 | INHALATION_SPRAY | Freq: Every day | RESPIRATORY_TRACT | 0 refills | Status: DC

## 2016-10-22 NOTE — Progress Notes (Signed)
Reviewed, agree 

## 2016-10-22 NOTE — Progress Notes (Signed)
Family Medicine  [facility] Discharge Summary  Patient name: [name] Medical record number: [id number] Date of birth: [birth year] Age: 66 y.o. Gender: female Date of Admission: (Not on file)  Date of Discharge: 10/22/16 Admitting Physician: No admitting provider for patient encounter.  Primary Care Provider: Ronnie Doss, DO Consultants: Pulmonology  Indication for SNF: Deconditioning, dyspnea on exertion  Discharge Diagnoses/Problem List:  Acute respiratory failure with hypoxia ([place])  PAH (pulmonary artery hypertension)  Chronic obstructive pulmonary disease with acute exacerbation ([place])  Noncompliance with medications  Tobacco use disorder  Spondylosis of lumbosacral region, unspecified spinal osteoarthritis complication status  Disposition: Discharge home  Discharge Condition: Stable  Discharge Exam:  Blood pressure 126/66, pulse 66, temperature 97 F (36.1 C), resp. rate 16, SpO2 95 %. Gen: awake, alert, well appearing female HEENT: MMM, sclera white Cardio: RRR, S1S2 present Pulm: normal WOB on room air, CTAB GI: soft, NT/ND, +BS Ext: WWP, no edema Psych: mood stable, speech normal Neuro: follows all commands  Brief Hospital Course:  [name] is a 66 y.o. female that was admitted to [facility] on 09/24/16 and discharged 10/04/16.  She was admitted for acute on respiratory failure, presumed to be COPD and CHF exacerbation.  She required direct admission to ICU with intubation 1/13-1/16.  She was diuresed aggressively and treated with oral steroids and IV abx for COPD exacerbation.  Her dry weight is 232#.  She was 233# at discharge.  She is to complete a steroid taper from 10/05/16 to 10/11/16.  Patient's O2 requirement was 3L at discharge from hospital.  She was admitted to [facility] for PT/OT, as she was deconditioned from acute illness.  She was stable on 2L [place].  She frequently refused to use this at rest, despite multiple counseling sessions.  She was stable on RA  prior to dc, requiring 2L O2 for desaturations only with ambulation and at night time.  Smoking cessation was highly encouraged and several counseling sessions were held with patient.  Review of inhalers and use was performed by Joellen Jersey, PharmD, prior to dc.  She was discharged with 2 week supply of both Dulera and Incruse (as Spiriva samples were not available).  She is to have her sleep study performed 11/25/16 and follow up Pulmonology appointment on 12/06/16.  She was discharged in stable condition.  DME for O2 provided.  HH to set this up today for patient.  Short course of Percocet provided to patient for low back pain exacerbation/breakthrough pain.  She seldom needed this during SNF stay, except after PT.  No home PT/OT needed.  Issues for Follow Up:  1. Pulm f/u 3/27 2. Sleep study 3/15 3. Smoking cessation 4. Compliance with O2, Lasix, COPD meds 5. Spiriva.  Patient given 2 week supply if Incruse until she is able to get Spiriva from [employer] through her Pulmonologist  Significant Procedures: none  Significant Labs and Imaging:  No results for input(s): WBC, HGB, HCT, PLT in the last 168 hours. No results for input(s): NA, K, CL, CO2, GLUCOSE, BUN, CREATININE, CALCIUM, MG, PHOS, ALKPHOS, AST, ALT, ALBUMIN, PROTEIN in the last 168 hours.  Invalid input(s): TBILI  Results/Tests Pending at Time of Discharge: none  Discharge Medications:  Allergies as of 10/22/2016   No Known Allergies     Medication List       Accurate as of 10/22/16  2:27 PM. Always use your most recent med list.          albuterol (2.5 MG/3ML) 0.083% nebulizer solution Commonly known  as:  PROVENTIL Take 3 mLs (2.5 mg total) by nebulization every 2 (two) hours as needed for wheezing.   aspirin 81 MG chewable tablet Chew 1 tablet (81 mg total) by mouth daily.   furosemide 20 MG tablet Commonly known as:  LASIX Take 1 tablet (20 mg total) by mouth daily.   guaiFENesin 600 MG 12 hr tablet Commonly known as:   MUCINEX Take 2 tablets (1,200 mg total) by mouth 2 (two) times daily.   ipratropium-albuterol 0.5-2.5 (3) MG/3ML Soln Commonly known as:  DUONEB Take 3 mLs by nebulization 2 (two) times daily.   mometasone-formoterol 100-5 MCG/ACT Aero Commonly known as:  DULERA Inhale 2 puffs into the lungs 2 (two) times daily.   multivitamin with minerals Tabs tablet Take 1 tablet by mouth daily.   nicotine 7 mg/24hr patch Commonly known as:  NICODERM CQ - dosed in mg/24 hr Place 1 patch (7 mg total) onto the skin daily.   oxyCODONE 5 MG immediate release tablet Commonly known as:  ROXICODONE Take 1 tablet (5 mg total) by mouth every 8 (eight) hours as needed for breakthrough pain.   tiotropium 18 MCG inhalation capsule Commonly known as:  SPIRIVA HANDIHALER Place 1 capsule (18 mcg total) into inhaler and inhale daily.       Discharge Instructions: Please refer to Patient Instructions section of EMR for full details.  Patient was counseled important signs and symptoms that should prompt return to medical care, changes in medications, dietary instructions, activity restrictions, and follow up appointments.   Follow-Up Appointments: Janith Lima [facility] 2/16 @ 11:30a  Janora Norlander, DO 10/22/2016, 2:27 PM PGY-3, [place]

## 2016-10-25 ENCOUNTER — Telehealth: Payer: Self-pay | Admitting: Pulmonary Disease

## 2016-10-29 ENCOUNTER — Encounter: Payer: Self-pay | Admitting: Family Medicine

## 2016-10-29 ENCOUNTER — Ambulatory Visit (INDEPENDENT_AMBULATORY_CARE_PROVIDER_SITE_OTHER): Payer: Medicare Other | Admitting: Family Medicine

## 2016-10-29 VITALS — BP 120/58 | HR 88 | Temp 98.2°F | Ht 65.0 in | Wt 238.0 lb

## 2016-10-29 DIAGNOSIS — F17200 Nicotine dependence, unspecified, uncomplicated: Secondary | ICD-10-CM | POA: Diagnosis not present

## 2016-10-29 DIAGNOSIS — J9611 Chronic respiratory failure with hypoxia: Secondary | ICD-10-CM | POA: Diagnosis not present

## 2016-10-29 DIAGNOSIS — I2721 Secondary pulmonary arterial hypertension: Secondary | ICD-10-CM

## 2016-10-29 MED ORDER — UMECLIDINIUM BROMIDE 62.5 MCG/INH IN AEPB: 1.0000 | INHALATION_SPRAY | Freq: Every day | RESPIRATORY_TRACT | 0 refills | Status: DC

## 2016-10-29 NOTE — Progress Notes (Signed)
    Subjective: CC: follow up from SNF HPI: [name] is a 66 y.o. female presenting to clinic today for:  [name] is a 65 y.o. female that was admitted to [facility] on 09/24/16 and discharged 10/04/16.  She was admitted for acute on respiratory failure, presumed to be COPD and CHF exacerbation.  She required direct admission to ICU with intubation 1/13-1/16.  She was diuresed aggressively and treated with oral steroids and IV abx for COPD exacerbation.  Her dry weight is 232#.  She was 233# at discharge.  Patient's O2 requirement was 3L at discharge from hospital.  She was rehabilitated at [facility].  She was breathing well on RA, using O2 only at night time.  She reports to me that she has been doing well since discharge from SNF.  She has been compliant with O2 at night time and with her inhalers.  She expects to see Dr Lake Bells on 3/26.  Sleep study scheduled for 3/15.  She has not started Incruse yet, as she still had Spiriva left over from her SNF stay.  She has been monitoring her O2 and she notes that it has been ranging from 94-98% at home.   She has not resumed smoking but notes that she never picked up the nicotine patches prescribed at discharge from SNF.  She notes increased moodiness.  Denies SOB, cough, choking, LE edema, decreased PO intake, somnolence.  Social Hx reviewed: former/ actively quitting smoker. MedHx, medications and allergies reviewed.  Please see EMR. ROS: Per HPI  Objective: Office vital signs reviewed. BP (!) 120/58   Pulse 88   Temp 98.2 F (36.8 C) (Oral)   Ht 5\' 5"  (1.651 m)   Wt 238 lb (108 kg)   SpO2 92%   BMI 39.61 kg/m   Physical Examination:  General: Awake, alert, well nourished, well appearing female, No acute distress Cardio: regular rate and rhythm, S1S2 heard, no murmurs appreciated Pulm: clear to auscultation bilaterally, no wheezes, rhonchi or rales; normal work of breathing on room air; prolonged expiratory phase  appreciated. Extremities: warm, well perfused, No edema, cyanosis or clubbing; +2 pulses bilaterally  Assessment/ Plan: 66 y.o. female   PAH (pulmonary artery hypertension) Has sleep study scheduled 3/15.  Encouraged to continue use of O2 as directed.  Actively quitting smoking.  Tobacco use disorder Reinforced Nicotine patch use.  Had a frank discussion about risks of smoking, esp in the setting of chronic lung disease and O2 use.  Patient will pick up patches.  Chronic respiratory failure [facility]) Patient had several questions regarding her lung disease.  She has been compliant with meds up to this point, since discharge.  She does seem somewhat resistant to being "labeled".  We had a long discussion about her health and I continued to recommend the medications/ plan set forth by Pulmonology.  Patient to keep appt with Dr Lake Bells on 3/26.  Additional month of Incruse given to start once she has run out of Spiriva, until she can get Spiriva through [employer].  Per patient this should be around the time of her pulmonology appt.  Follow up in 1 month for physical exam.  Janora Norlander, DO PGY-3, [facility]

## 2016-10-29 NOTE — Assessment & Plan Note (Signed)
Reinforced Nicotine patch use.  Had a frank discussion about risks of smoking, esp in the setting of chronic lung disease and O2 use.  Patient will pick up patches.

## 2016-10-29 NOTE — Assessment & Plan Note (Signed)
Has sleep study scheduled 3/15.  Encouraged to continue use of O2 as directed.  Actively quitting smoking.

## 2016-10-29 NOTE — Addendum Note (Signed)
Addended by: Londell Moh T on: 10/29/2016 06:14 PM   Modules accepted: Orders

## 2016-10-29 NOTE — Assessment & Plan Note (Signed)
Patient had several questions regarding her lung disease.  She has been compliant with meds up to this point, since discharge.  She does seem somewhat resistant to being "labeled".  We had a long discussion about her health and I continued to recommend the medications/ plan set forth by Pulmonology.  Patient to keep appt with Dr Lake Bells on 3/26.  Additional month of Incruse given to start once she has run out of Spiriva, until she can get Spiriva through [employer].  Per patient this should be around the time of her pulmonology appt.

## 2016-10-29 NOTE — Patient Instructions (Addendum)
I am so happy to see that you are doing well since discharge.  I have given you another week supply of Incruse.  I recommend that you use your Spiriva up and use this only as an alternative to Spiriva until you can get medication from the manufacturer.  I did send in Nicotine patches to your pharmacy.  Pick those up.  Remember to remove before bedtime.  Follow up with me after you have seen Dr Lake Bells for an annual exam.

## 2016-10-29 NOTE — Telephone Encounter (Signed)
Rec'd FMLA paperwork from [employer] completed - fwd to [employer] via interoffice mail - 10/29/16 -pr

## 2016-11-01 NOTE — Telephone Encounter (Signed)
Samples for Brovana were placed up front to pick up 10/01/16. These were never picked up.  Samples are expired and will be disposed of accordingly.  Nothing further needed.

## 2016-11-01 NOTE — Progress Notes (Signed)
I discussed this patient's case with Dr. Lajuana Ripple.  I agree with their plan as documented in their discharge note.

## 2016-11-17 ENCOUNTER — Telehealth: Payer: Self-pay | Admitting: Pulmonary Disease

## 2016-11-17 NOTE — Telephone Encounter (Signed)
Form given to BQ to address

## 2016-11-17 NOTE — Telephone Encounter (Signed)
Rec'd forms back - fwd to [employer] via interoffice mail - 11/17/16 -pr

## 2016-11-17 NOTE — Telephone Encounter (Signed)
Form filled out by BQ and placed back on Patrice's desk.

## 2016-11-25 ENCOUNTER — Ambulatory Visit (HOSPITAL_BASED_OUTPATIENT_CLINIC_OR_DEPARTMENT_OTHER): Payer: Medicare Other | Attending: Adult Health | Admitting: Pulmonary Disease

## 2016-11-25 VITALS — Ht 65.0 in | Wt 235.0 lb

## 2016-11-25 DIAGNOSIS — J9611 Chronic respiratory failure with hypoxia: Secondary | ICD-10-CM

## 2016-11-25 DIAGNOSIS — G4710 Hypersomnia, unspecified: Secondary | ICD-10-CM | POA: Insufficient documentation

## 2016-11-25 DIAGNOSIS — R0689 Other abnormalities of breathing: Secondary | ICD-10-CM

## 2016-11-25 DIAGNOSIS — G4719 Other hypersomnia: Secondary | ICD-10-CM

## 2016-11-25 DIAGNOSIS — R0902 Hypoxemia: Secondary | ICD-10-CM | POA: Diagnosis not present

## 2016-11-25 DIAGNOSIS — R0683 Snoring: Secondary | ICD-10-CM

## 2016-11-26 ENCOUNTER — Telehealth: Payer: Self-pay | Admitting: Adult Health

## 2016-11-26 NOTE — Telephone Encounter (Signed)
Noted  

## 2016-11-30 DIAGNOSIS — R0683 Snoring: Secondary | ICD-10-CM

## 2016-11-30 DIAGNOSIS — G4719 Other hypersomnia: Secondary | ICD-10-CM | POA: Diagnosis not present

## 2016-11-30 DIAGNOSIS — J9611 Chronic respiratory failure with hypoxia: Secondary | ICD-10-CM

## 2016-11-30 NOTE — Procedures (Signed)
Patient Name: [name], [name] Date: 11/25/2016 Gender: Female D.O.B: [birth year] Age (years): 36 Referring Provider: Lynelle Smoke Parrett Height (inches): 65 Interpreting Physician: Kara Mead MD, ABSM Weight (lbs): 235 RPSGT: Baxter Flattery BMI: 75 MRN: [id number] Neck Size: 16.00   CLINICAL INFORMATION Sleep Study Type: NPSG  Indication for sleep study: Congestive Heart Failure, Excessive Daytime Sleepiness, Hypertension, Obesity, Snoring, Witnessed Apneas  Epworth Sleepiness Score:8     SLEEP STUDY TECHNIQUE As per the AASM Manual for the Scoring of Sleep and Associated Events v2.3 (April 2016) with a hypopnea requiring 4% desaturations.  The channels recorded and monitored were frontal, central and occipital EEG, electrooculogram (EOG), submentalis EMG (chin), nasal and oral airflow, thoracic and abdominal wall motion, anterior tibialis EMG, snore microphone, electrocardiogram, and pulse oximetry.  SLEEP ARCHITECTURE The study was initiated at 10:54:49 PM and ended at 5:01:51 AM.  Sleep onset time was 104.9 minutes and the sleep efficiency was 33.9%. The total sleep time was 124.5 minutes.  Stage REM latency was 55.5 minutes.  The patient spent 0.80% of the night in stage N1 sleep, 40.96% in stage N2 sleep, 47.39% in stage N3 and 10.84% in REM.  Alpha intrusion was absent.  Supine sleep was 0.00%.  RESPIRATORY PARAMETERS The overall apnea/hypopnea index (AHI) was 1.4 per hour. There were 0 total apneas, including 0 obstructive, 0 central and 0 mixed apneas. There were 3 hypopneas and 4 RERAs.  The AHI during Stage REM sleep was 8.9 per hour.  Supine sleep was not noted  The mean oxygen saturation was 89.80%. The minimum SpO2 during sleep was 81.00%.  Moderate snoring was noted during this study.  CARDIAC DATA The 2 lead EKG demonstrated sinus rhythm. The mean heart rate was 69.95 beats per minute. Other EKG findings include: None.   LEG MOVEMENT DATA The total  PLMS were 0 with a resulting PLMS index of 0.00. Associated arousal with leg movement index was 0.0 .  IMPRESSIONS - No significant obstructive sleep apnea occurred during this study (AHI = 1.4/h).Note that this may be false negative test due to limited amount of sleep noted and lack of supine sleep - No significant central sleep apnea occurred during this study (CAI = 0.0/h). - Moderate oxygen desaturation was noted during this study (Min O2 = 81.00%). 1 L of oxygen was appliedAt around 1 AM - The patient snored with Moderate snoring volume. - No cardiac abnormalities were noted during this study. - Clinically significant periodic limb movements did not occur during sleep. No significant associated arousals.   DIAGNOSIS - Nocturnal Hypoxemia (327.26 [G47.36 ICD-10])   RECOMMENDATIONS - 1 L of oxygen to be used during sleep. - If significant concern for sleep-disordered breathing persists, consider repeating sleep study - Avoid alcohol, sedatives and other CNS depressants that may worsen sleep apnea and disrupt normal sleep architecture. - Sleep hygiene should be reviewed to assess factors that may improve sleep quality. - Weight management and regular exercise should be initiated or continued if appropriate.   Kara Mead MD. Sh[facility] Flood. [facility] Pager [phone] If no response call [phone]   11/30/2016

## 2016-12-02 NOTE — Progress Notes (Signed)
A, Please let the patient know this was OK > needs 1 L O2 at night, doesn't appear to have sleep apnea, but the test may be limited by too little sleep.  When she did sleep she didn't show signs of sleep apnea. Thanks, B

## 2016-12-06 ENCOUNTER — Telehealth: Payer: Self-pay | Admitting: Adult Health

## 2016-12-06 NOTE — Telephone Encounter (Signed)
Sleep study 11/26/15 neg for OSA . Did show some drops in Oxygen level  Recommend to begin 1 L of oxygen to be used during sleep. Check to see she may has O2 At bedtime  Listed .  She can continue on o2 At bedtime  -needs 1l/m according to sleep study.   Recommend:  - Avoid alcohol, sedatives and other CNS depressants that may worsen  and disrupt normal sleep architecture. - Sleep hygiene should be reviewed to assess factors that may improve sleep quality. - Weight management and regular exercise should be initiated or continued if appropriate.  Follow up as planned and As needed   Make sure she has follow up with her MD in pulmonary .

## 2016-12-07 ENCOUNTER — Ambulatory Visit (INDEPENDENT_AMBULATORY_CARE_PROVIDER_SITE_OTHER): Payer: Medicare Other | Admitting: Pulmonary Disease

## 2016-12-07 ENCOUNTER — Encounter: Payer: Self-pay | Admitting: Pulmonary Disease

## 2016-12-07 VITALS — BP 144/76 | HR 76 | Ht 65.0 in | Wt 241.0 lb

## 2016-12-07 DIAGNOSIS — R400 Somnolence: Secondary | ICD-10-CM

## 2016-12-07 DIAGNOSIS — R5383 Other fatigue: Secondary | ICD-10-CM

## 2016-12-07 DIAGNOSIS — J9611 Chronic respiratory failure with hypoxia: Secondary | ICD-10-CM | POA: Diagnosis not present

## 2016-12-07 DIAGNOSIS — J438 Other emphysema: Secondary | ICD-10-CM

## 2016-12-07 NOTE — Assessment & Plan Note (Signed)
She has severe emphysema and has been hospitalized 3 times in the intensive care unit over the last 4 years for this condition.  Fortunately she has quit smoking.  It's not clear to me that ongoing Spriva use will be helpful and she has had many side effects from this class of medicines.  I believe that ongoing Dulera use is appropriate.  We need to work to mitigate her chronic hypercapnia. See above.  Plan: Continue Dulera Hold Spiriva  Greater than 50% of this 38 minute visit spent face-to-face with the patient and her daughter answering their questions

## 2016-12-07 NOTE — Progress Notes (Signed)
Subjective:    Patient ID: [name], female    DOB: 04-21-1951, 66 y.o.   MRN: [id number]  Synopsis: First referred to [place] [facility] in 2015 in the setting of a COPD exacerbation requiring mechanical ventilation. She was then hospitalized in November 2016 for severe hypercapnic respiratory failure requiring BiPAP for nearly 24 hours. She was treated for a COPD exacerbation as well as CHF. 12/2013 PFT> Ratio 46%, FEV1  1.24L (54% pred, 44% change), TLC 6.17L (127% pred), DLCO 10.82 (48% pred),   HPI Chief Complaint  Patient presents with  . Follow-up    pt doing well, denies any complaints today. review sleep study.   [name] feels that she is doing well.  No bronchitis, no wheezing.    She has had sinus problems which she says may be worse right now due to allergies.  She says that her PCP gave her Incruise recently because they didn't have Spiriva.  [name] wonders if this is related to the feeling of plegm she feels in her throat.  She doesn't have chest symptoms.  She continues to take the [facility].  She is not using the oxygen at night.  She only uses it on an as needed basis.  She currently feels tired in the mornings when she wakes up, doesn't feel well rested.       Past Medical History:  Diagnosis Date  . Abnormal weight gain 08/14/2009   Qualifier: Diagnosis of  By: Lindell Noe MD, Jeneen Rinks    . Acute respiratory failure ([place])   . Allergic rhinitis   . Anemia   . Breast cancer ([place]) 1997   s/p radiation and lumpectomy.  . CAP (community acquired pneumonia) 12/2013  . Chronic lower back pain   . COPD (chronic obstructive pulmonary disease) ([place])   . DVT (deep venous thrombosis) ([place]) ~ 2004   LLE  . Edema extremities    lower  . Fatigue   . Hypertension    "only when I get stressed" (09/24/2016)  . Hypoalbuminemia   . Hypocalcemia   . Macular degeneration   . Osteoarthritis   . Psoriasis   . Tobacco abuse       Review of Systems     Objective:   Physical Exam  Vitals:   12/07/16 1509  BP: (!) 144/76  Pulse: 76  SpO2: 95%  Weight: 241 lb (109.3 kg)  Height: 5\' 5"  (1.651 m)  RA  Gen: well appearing HENT: OP clear, TM's clear, neck supple PULM: CTA B, normal percussion CV: RRR, no mgr, trace edema GI: BS+, soft, nontender Derm: no cyanosis or rash Psyche: normal mood and affect   Records from her November 2016 hospital visit reviewed Records from our clinic visit since the last visit were reviewed November 2016 CT chest images personally reviewed showing diffuse centrilobular emphysema  Polysomnogram from March 2018 reviewed where it showed that she had nocturnal hypoxemia but findings were not consistent with obstructive sleep apnea, notes were made that she had insufficient sleep for complete study.     Assessment & Plan:  Chronic respiratory failure ([place]) During repeated hospitalizations she has had evidence of acute on chronic hypercapnic respiratory failure. I believe that this is due to her COPD as well as obesity hypoventilation syndrome. Unfortunately the polysomnogram data wasn't adequate because she did not sleep very well.  Plan: In the future when hospitalized she should have an ABG on admission We will rearrange another sleep study, this time at home Weight loss was strongly  encourage She was advised today to use oxygen at night while sleeping  Other emphysema ([place]) She has severe emphysema and has been hospitalized 3 times in the intensive care unit over the last 4 years for this condition.  Fortunately she has quit smoking.  It's not clear to me that ongoing Spriva use will be helpful and she has had many side effects from this class of medicines.  I believe that ongoing Dulera use is appropriate.  We need to work to mitigate her chronic hypercapnia. See above.  Plan: Continue Dulera Hold Spiriva  Greater than 50% of this 38 minute visit spent face-to-face with the patient and her daughter  answering their questions    Current Outpatient Prescriptions:  .  aspirin 81 MG chewable tablet, Chew 1 tablet (81 mg total) by mouth daily., Disp: 30 tablet, Rfl: 0 .  furosemide (LASIX) 20 MG tablet, Take 1 tablet (20 mg total) by mouth daily., Disp: 30 tablet, Rfl: 0 .  mometasone-formoterol (DULERA) 100-5 MCG/ACT AERO, Inhale 2 puffs into the lungs 2 (two) times daily., Disp: 1 Inhaler, Rfl: 1 .  Multiple Vitamin (MULTIVITAMIN WITH MINERALS) TABS tablet, Take 1 tablet by mouth daily., Disp: , Rfl:  .  nicotine (NICODERM CQ - DOSED IN MG/24 HR) 7 mg/24hr patch, Place 1 patch (7 mg total) onto the skin daily., Disp: 28 patch, Rfl: 0 .  oxyCODONE (ROXICODONE) 5 MG immediate release tablet, Take 1 tablet (5 mg total) by mouth every 8 (eight) hours as needed for breakthrough pain., Disp: 15 tablet, Rfl: 0

## 2016-12-07 NOTE — Patient Instructions (Signed)
We will arrange a home sleep study Keep taking the [facility] 2 puffs twice a day no matter how you feel Hold off on taking Spiriva for now Stay away from cigarettes We will see you back in 3 months or sooner if needed

## 2016-12-07 NOTE — Assessment & Plan Note (Signed)
During repeated hospitalizations she has had evidence of acute on chronic hypercapnic respiratory failure. I believe that this is due to her COPD as well as obesity hypoventilation syndrome. Unfortunately the polysomnogram data wasn't adequate because she did not sleep very well.  Plan: In the future when hospitalized she should have an ABG on admission We will rearrange another sleep study, this time at home Weight loss was strongly encourage She was advised today to use oxygen at night while sleeping

## 2016-12-09 NOTE — Telephone Encounter (Signed)
Called spoke with patient, advised of sleep study results as stated by TP - pt does have O2 at home, will wear 1lpm w/ sleep.    Pt did mention that she saw BQ on 3.27.18 and a HST was recommended.  Pt stated she would rather hold off on this expense if possible and just do the 1L O2 w/ sleep.  Advised pt will discuss with TP and call her back.  TP please advise, thank you.  Per 3.27.18 ov w/ BQ: Patient Instructions  We will arrange a home sleep study Keep taking the [facility] 2 puffs twice a day no matter how you feel Hold off on taking Spiriva for now Stay away from cigarettes We will see you back in 3 months or sooner if needed

## 2016-12-09 NOTE — Telephone Encounter (Signed)
Ok can discuss at return ov

## 2016-12-14 NOTE — Telephone Encounter (Signed)
Called spoke with patient, she will hold off on the HST and discuss at next ov Nothing further needed; will sign off

## 2016-12-27 ENCOUNTER — Encounter: Payer: Self-pay | Admitting: Family Medicine

## 2016-12-30 ENCOUNTER — Encounter: Payer: Self-pay | Admitting: Family Medicine

## 2017-01-17 ENCOUNTER — Other Ambulatory Visit: Payer: Self-pay | Admitting: Family Medicine

## 2017-01-17 MED ORDER — FUROSEMIDE 20 MG PO TABS: 20.0000 mg | ORAL_TABLET | Freq: Every day | ORAL | 1 refills | Status: DC

## 2017-01-17 NOTE — Telephone Encounter (Signed)
pt calling to request refill of:  Name of Medication(s): fursemide Last date of OV:10-29-16 Pharmacy:  [place] at [facility]  Will route refill request to Clinic RN.  Discussed with patient policy to call pharmacy for future refills.  Also, discussed refills may take up to 48 hours to approve or deny.  Roseanna Rainbow

## 2017-01-18 ENCOUNTER — Other Ambulatory Visit: Payer: Self-pay | Admitting: *Deleted

## 2017-01-18 MED ORDER — FUROSEMIDE 20 MG PO TABS: 20.0000 mg | ORAL_TABLET | Freq: Every day | ORAL | 1 refills | Status: DC

## 2017-01-24 NOTE — Telephone Encounter (Signed)
Received letter from [facility] Patient has been approved for patient assistance Letter sent for scan

## 2017-03-08 ENCOUNTER — Encounter: Payer: Self-pay | Admitting: Pulmonary Disease

## 2017-03-08 ENCOUNTER — Ambulatory Visit (INDEPENDENT_AMBULATORY_CARE_PROVIDER_SITE_OTHER): Payer: Medicare Other | Admitting: Pulmonary Disease

## 2017-03-08 VITALS — BP 148/84 | HR 76 | Ht 65.0 in | Wt 241.0 lb

## 2017-03-08 DIAGNOSIS — J438 Other emphysema: Secondary | ICD-10-CM

## 2017-03-08 DIAGNOSIS — J9611 Chronic respiratory failure with hypoxia: Secondary | ICD-10-CM

## 2017-03-08 NOTE — Progress Notes (Signed)
Subjective:    Patient ID: [name], female    DOB: [birth year], 66 y.o.   MRN: [id number]  Synopsis: First referred to [facility] in 2015 in the setting of a COPD exacerbation requiring mechanical ventilation. She was then hospitalized in November 2016 for severe hypercapnic respiratory failure requiring BiPAP for nearly 24 hours. She was treated for a COPD exacerbation as well as CHF.  HPI Chief Complaint  Patient presents with  . Follow-up    pt states she is doing well, denies any new complaints.  needs new pt assistance forms for dulera.     [name] says that it if is really hot she feels more dyspneic. She feels really OK.  She continues to have knee and back pain.   She is trying to watch what she eats and trying to stay away from carbs. She is still taking Dulera.  She hasn't had a cough, no cold, no bronchitis episodes.   She has cut down her cigarettes to 4-5 per day.      Past Medical History:  Diagnosis Date  . Abnormal weight gain 08/14/2009   Qualifier: Diagnosis of  By: Lindell Noe MD, Jeneen Rinks    . Acute respiratory failure ([place])   . Allergic rhinitis   . Anemia   . Breast cancer ([place]) 1997   s/p radiation and lumpectomy.  . CAP (community acquired pneumonia) 12/2013  . Chronic lower back pain   . COPD (chronic obstructive pulmonary disease) ([place])   . DVT (deep venous thrombosis) ([place]) ~ 2004   LLE  . Edema extremities    lower  . Fatigue   . Hypertension    "only when I get stressed" (09/24/2016)  . Hypoalbuminemia   . Hypocalcemia   . Macular degeneration   . Osteoarthritis   . Psoriasis   . Tobacco abuse       Review of Systems     Objective:   Physical Exam  Vitals:   03/08/17 1410  BP: (!) 148/84  Pulse: 76  SpO2: 94%  Weight: 241 lb (109.3 kg)  Height: 5\' 5"  (1.651 m)  RA  Gen: well appearing HENT: OP clear, TM's clear, neck supple PULM: Poor air movement B, normal percussion CV: RRR, no mgr, trace edema GI: BS+, soft,  nontender Derm: no cyanosis or rash Psyche: normal mood and affect   PFT: 12/2013 PFT> Ratio 46%, FEV1  1.24L (54% pred, 44% change), TLC 6.17L (127% pred), DLCO 10.82 (48% pred),   Chest imaging: November 2016 CT chest images personally reviewed showing diffuse centrilobular emphysema  Polysomnogram from March 2018 reviewed where it showed that she had nocturnal hypoxemia but findings were not consistent with obstructive sleep apnea, notes were made that she had insufficient sleep for complete study.     Assessment & Plan:  Chronic respiratory failure with hypoxia (HCC)  Other emphysema ([place])   Discussion: This has been a stable interval for her but unfortunately she started smoking again. Today I counseled her on the value of smoking cessation. She is trying to quit. She does well with Dulera.  Plan: Tobacco use: Try to quit smoking Review the smoking cessation sheets we gave you  For your COPD: Stay active, exercise regulalry Take Dulera twice a day Practice good hand hygiene when out in public  We will see you back in 4-6 months.    Current Outpatient Prescriptions:  .  aspirin 81 MG chewable tablet, Chew 1 tablet (81 mg total) by mouth daily., Disp: 30  tablet, Rfl: 0 .  furosemide (LASIX) 20 MG tablet, Take 1 tablet (20 mg total) by mouth daily., Disp: 30 tablet, Rfl: 1 .  mometasone-formoterol (DULERA) 100-5 MCG/ACT AERO, Inhale 2 puffs into the lungs 2 (two) times daily., Disp: 1 Inhaler, Rfl: 1 .  Multiple Vitamin (MULTIVITAMIN WITH MINERALS) TABS tablet, Take 1 tablet by mouth daily., Disp: , Rfl:  .  nicotine (NICODERM CQ - DOSED IN MG/24 HR) 7 mg/24hr patch, Place 1 patch (7 mg total) onto the skin daily., Disp: 28 patch, Rfl: 0 .  oxyCODONE (ROXICODONE) 5 MG immediate release tablet, Take 1 tablet (5 mg total) by mouth every 8 (eight) hours as needed for breakthrough pain., Disp: 15 tablet, Rfl: 0

## 2017-03-08 NOTE — Patient Instructions (Signed)
Tobacco use: Try to quit smoking Review the smoking cessation sheets we gave you  For your COPD: Stay active, exercise regulalry Take Dulera twice a day Practice good hand hygiene when out in public  We will see you back in 4-6 months.

## 2017-04-04 ENCOUNTER — Telehealth: Payer: Self-pay | Admitting: Pulmonary Disease

## 2017-04-04 MED ORDER — MOMETASONE FURO-FORMOTEROL FUM 100-5 MCG/ACT IN AERO: 2.0000 | INHALATION_SPRAY | Freq: Two times a day (BID) | RESPIRATORY_TRACT | 0 refills | Status: DC

## 2017-04-04 NOTE — Telephone Encounter (Signed)
I have called [facility] and they are going to get her meds shipped out today.  She is aware of the sample that has been left up front and she will come by and pick this up tomorrow.

## 2017-04-05 ENCOUNTER — Telehealth: Payer: Self-pay | Admitting: Student

## 2017-04-05 ENCOUNTER — Telehealth: Payer: Self-pay | Admitting: Pulmonary Disease

## 2017-04-05 NOTE — Telephone Encounter (Signed)
Sample has been removed from up front and placed back in the samples closet.

## 2017-04-05 NOTE — Telephone Encounter (Signed)
Have you received a mammogram in the past 2 years? no Did you get a referal, or cancel/no show appt? If yes to any, why? no  Pt has the number for [facility] and doesn't want to schedule appt. States "I've had enough chemo, and testing. I don't think I need it". - [name]

## 2017-07-27 ENCOUNTER — Ambulatory Visit: Payer: Self-pay | Admitting: Pulmonary Disease

## 2017-07-27 NOTE — Progress Notes (Deleted)
   Subjective:    Patient ID: [name], female    DOB: [birth year], 66 y.o.   MRN: [id number]  Synopsis: First referred to [facility] in 2015 in the setting of a COPD exacerbation requiring mechanical ventilation. She was then hospitalized in November 2016 for severe hypercapnic respiratory failure requiring BiPAP for nearly 24 hours. She was treated for a COPD exacerbation as well as CHF.  HPI No chief complaint on file.  ***still somking last visit  Past Medical History:  Diagnosis Date  . Abnormal weight gain 08/14/2009   Qualifier: Diagnosis of  By: Lindell Noe MD, Jeneen Rinks    . Acute respiratory failure ([place])   . Allergic rhinitis   . Anemia   . Breast cancer ([place]) 1997   s/p radiation and lumpectomy.  . CAP (community acquired pneumonia) 12/2013  . Chronic lower back pain   . COPD (chronic obstructive pulmonary disease) ([place])   . DVT (deep venous thrombosis) ([place]) ~ 2004   LLE  . Edema extremities    lower  . Fatigue   . Hypertension    "only when I get stressed" (09/24/2016)  . Hypoalbuminemia   . Hypocalcemia   . Macular degeneration   . Osteoarthritis   . Psoriasis   . Tobacco abuse       Review of Systems     Objective:   Physical Exam  There were no vitals filed for this visit.RA  ***   PFT: 12/2013 PFT> Ratio 46%, FEV1  1.24L (54% pred, 44% change), TLC 6.17L (127% pred), DLCO 10.82 (48% pred),   Chest imaging: November 2016 CT chest images personally reviewed showing diffuse centrilobular emphysema  Polysomnogram from March 2018 reviewed where it showed that she had nocturnal hypoxemia but findings were not consistent with obstructive sleep apnea, notes were made that she had insufficient sleep for complete study.     Assessment & Plan:  No diagnosis found.  ***   Current Outpatient Medications:  .  aspirin 81 MG chewable tablet, Chew 1 tablet (81 mg total) by mouth daily., Disp: 30 tablet, Rfl: 0 .  furosemide (LASIX) 20 MG tablet,  Take 1 tablet (20 mg total) by mouth daily., Disp: 30 tablet, Rfl: 1 .  mometasone-formoterol (DULERA) 100-5 MCG/ACT AERO, Inhale 2 puffs into the lungs 2 (two) times daily., Disp: 1 Inhaler, Rfl: 1 .  mometasone-formoterol (DULERA) 100-5 MCG/ACT AERO, Inhale 2 puffs into the lungs 2 (two) times daily., Disp: 1 Inhaler, Rfl: 0 .  Multiple Vitamin (MULTIVITAMIN WITH MINERALS) TABS tablet, Take 1 tablet by mouth daily., Disp: , Rfl:  .  nicotine (NICODERM CQ - DOSED IN MG/24 HR) 7 mg/24hr patch, Place 1 patch (7 mg total) onto the skin daily., Disp: 28 patch, Rfl: 0 .  oxyCODONE (ROXICODONE) 5 MG immediate release tablet, Take 1 tablet (5 mg total) by mouth every 8 (eight) hours as needed for breakthrough pain., Disp: 15 tablet, Rfl: 0

## 2017-08-17 ENCOUNTER — Ambulatory Visit (INDEPENDENT_AMBULATORY_CARE_PROVIDER_SITE_OTHER): Payer: Medicare Other | Admitting: Pulmonary Disease

## 2017-08-17 ENCOUNTER — Encounter: Payer: Self-pay | Admitting: Pulmonary Disease

## 2017-08-17 ENCOUNTER — Ambulatory Visit (INDEPENDENT_AMBULATORY_CARE_PROVIDER_SITE_OTHER)
Admission: RE | Admit: 2017-08-17 | Discharge: 2017-08-17 | Disposition: A | Discharge status: Home / Self Care | Payer: Medicare Other | Source: Ambulatory Visit | Attending: Pulmonary Disease | Admitting: Pulmonary Disease

## 2017-08-17 VITALS — BP 144/82 | HR 91

## 2017-08-17 DIAGNOSIS — J9611 Chronic respiratory failure with hypoxia: Secondary | ICD-10-CM | POA: Diagnosis not present

## 2017-08-17 DIAGNOSIS — R059 Cough, unspecified: Secondary | ICD-10-CM

## 2017-08-17 DIAGNOSIS — R05 Cough: Secondary | ICD-10-CM | POA: Diagnosis not present

## 2017-08-17 DIAGNOSIS — J441 Chronic obstructive pulmonary disease with (acute) exacerbation: Secondary | ICD-10-CM

## 2017-08-17 DIAGNOSIS — F17210 Nicotine dependence, cigarettes, uncomplicated: Secondary | ICD-10-CM | POA: Diagnosis not present

## 2017-08-17 MED ORDER — PREDNISONE 20 MG PO TABS: 20.0000 mg | ORAL_TABLET | Freq: Every day | ORAL | 0 refills | Status: DC

## 2017-08-17 MED ORDER — DOXYCYCLINE HYCLATE 100 MG PO TABS: 100.0000 mg | ORAL_TABLET | Freq: Every day | ORAL | 0 refills | Status: DC

## 2017-08-17 MED ORDER — ALBUTEROL SULFATE 0.63 MG/3ML IN NEBU: 1.0000 | INHALATION_SOLUTION | Freq: Four times a day (QID) | RESPIRATORY_TRACT | 12 refills | Status: DC | PRN

## 2017-08-17 NOTE — Progress Notes (Signed)
Subjective:    Patient ID: [name], female    DOB: 03-19-1951, 66 y.o.   MRN: [id number]  Synopsis: First referred to [facility] in 2015 in the setting of a COPD exacerbation requiring mechanical ventilation. She was then hospitalized in November 2016 for severe hypercapnic respiratory failure requiring BiPAP for nearly 24 hours. She was treated for a COPD exacerbation as well as CHF.  HPI Chief Complaint  Patient presents with  . Follow-up    Pt states that she had bronchitis xlast 2 weeks. C/o cough with clear mucus, SOB. Denies any CP but does have pain in side from coughing. Pt does wear O2 at night, DME: [facility].   In the last two weeks [name] has been fighting bronchitis symptoms.  She says that this has progressively worsened.  She notes chest congestion and cough.  She has been short of breath at home.  She has been taking more vitamin C at home and Dayquil.  She is coughing up phlegm.  She had sinus symptoms to start with.  She can feel the mucus drainig down her throat.  Prior to this she had been doing well. Her oxygen had been doing OK.    She is still taking Dulera regularly.  She hasn't had any problems with it.  She continues to smoke cigarettes.    Past Medical History:  Diagnosis Date  . Abnormal weight gain 08/14/2009   Qualifier: Diagnosis of  By: Lindell Noe MD, Jeneen Rinks    . Acute respiratory failure ([place])   . Allergic rhinitis   . Anemia   . Breast cancer ([place]) 1997   s/p radiation and lumpectomy.  . CAP (community acquired pneumonia) 12/2013  . Chronic lower back pain   . COPD (chronic obstructive pulmonary disease) ([place])   . DVT (deep venous thrombosis) ([place]) ~ 2004   LLE  . Edema extremities    lower  . Fatigue   . Hypertension    "only when I get stressed" (09/24/2016)  . Hypoalbuminemia   . Hypocalcemia   . Macular degeneration   . Osteoarthritis   . Psoriasis   . Tobacco abuse       Review of Systems  Constitutional: Positive for fatigue.  Negative for chills and fever.  HENT: Positive for congestion, rhinorrhea and sinus pressure.   Respiratory: Positive for cough, shortness of breath and wheezing.   Cardiovascular: Negative for chest pain, palpitations and leg swelling.       Objective:   Physical Exam  Vitals:   08/17/17 1521  BP: (!) 144/82  Pulse: 91  SpO2: 90%  RA  Gen: chronically ill appearing HENT: OP clear, TM's clear, neck supple PULM: No wheezing, poor air movement, normal effort CV: RRR, no mgr, trace edema GI: BS+, soft, nontender Derm: no cyanosis or rash Psyche: normal mood and affect    PFT: 12/2013 PFT> Ratio 46%, FEV1  1.24L (54% pred, 44% change), TLC 6.17L (127% pred), DLCO 10.82 (48% pred),   Chest imaging: November 2016 CT chest images personally reviewed showing diffuse centrilobular emphysema  Polysomnogram from March 2018 reviewed where it showed that she had nocturnal hypoxemia but findings were not consistent with obstructive sleep apnea, notes were made that she had insufficient sleep for complete study.     Assessment & Plan:  Cough - Plan: DG Chest 2 View, Ambulatory Referral for DME  Chronic respiratory failure with hypoxia ([place])  COPD with acute exacerbation ([place])  Cigarette smoker  Discussion: Unfortunately [name] is having  another exacerbation of her chronic obstructive pulmonary disease.  Given her history of severe exacerbations repeatedly which have required ICU admission and intubation we need to start treating this immediately.  I am going to give her prednisone and doxycycline to take at home and then we will get her a new nebulizer machine and have advised her to use albuterol 3 times a day for the next 5 days.  After that she can use it as needed.  If she is not improving then she needs to come back right away.  Unfortunately this is happening because she continues to smoke cigarettes.  We talked about the significance of this today and she was counseled to quit  smoking.  Plan: COPD with acute exacerbation: Take prednisone 20 mg daily times 5 days Take doxycycline 100 mg twice a day times 5 days Use the albuterol nebulizer 3 times a day for the next 5 days then every 4-6 hours as needed for shortness of breath Keep taking Dulera 2 puffs daily Chest x-ray today to make sure there is not something else going on Follow-up in 4-6 weeks with a nurse practitioner to make sure you are getting better  Tobacco abuse: Stop smoking right away  Follow-up in 4-6 weeks with a nurse practitioner   Current Outpatient Medications:  .  Ascorbic Acid (VITAMIN C) 1000 MG tablet, Take 1,000 mg by mouth 3 (three) times daily as needed., Disp: , Rfl:  .  aspirin 81 MG chewable tablet, Chew 1 tablet (81 mg total) by mouth daily., Disp: 30 tablet, Rfl: 0 .  furosemide (LASIX) 20 MG tablet, Take 1 tablet (20 mg total) by mouth daily., Disp: 30 tablet, Rfl: 1 .  mometasone-formoterol (DULERA) 100-5 MCG/ACT AERO, Inhale 2 puffs into the lungs 2 (two) times daily., Disp: 1 Inhaler, Rfl: 1 .  Multiple Vitamin (MULTIVITAMIN WITH MINERALS) TABS tablet, Take 1 tablet by mouth daily., Disp: , Rfl:  .  oxyCODONE (ROXICODONE) 5 MG immediate release tablet, Take 1 tablet (5 mg total) by mouth every 8 (eight) hours as needed for breakthrough pain., Disp: 15 tablet, Rfl: 0 .  albuterol (ACCUNEB) 0.63 MG/3ML nebulizer solution, Take 3 mLs (0.63 mg total) by nebulization every 6 (six) hours as needed for wheezing., Disp: 75 mL, Rfl: 12 .  doxycycline (VIBRA-TABS) 100 MG tablet, Take 1 tablet (100 mg total) by mouth daily., Disp: 5 tablet, Rfl: 0 .  predniSONE (DELTASONE) 20 MG tablet, Take 1 tablet (20 mg total) by mouth daily with breakfast., Disp: 5 tablet, Rfl: 0

## 2017-08-17 NOTE — Patient Instructions (Signed)
COPD with acute exacerbation: Take prednisone 20 mg daily times 5 days Take doxycycline 100 mg twice a day times 5 days Use the albuterol nebulizer 3 times a day for the next 5 days then every 4-6 hours as needed for shortness of breath Keep taking Dulera 2 puffs daily Chest x-ray today to make sure there is not something else going on Follow-up in 4-6 weeks with a nurse practitioner to make sure you are getting better  Tobacco abuse: Stop smoking right away  Follow-up in 4-6 weeks with a nurse practitioner

## 2017-08-18 ENCOUNTER — Telehealth: Payer: Self-pay

## 2017-08-18 NOTE — Telephone Encounter (Signed)
C, Please let the patient know this showed some chronic bronchitis changes. Thanks, B     Called and spoke with pt and she is aware of BQ recs of the cxr.  Pt voiced her understanding.  Pt stated that she is confused about the albuterol.  She stated that she went to the pharmacy to pick up her meds yesterday and they advised her that she will be getting the nebulizer.  She stated that she didn't feel that she would need this but thought that she would be getting the albuterol inhaler.  BQ please advise. Thanks  No Known Allergies

## 2017-08-18 NOTE — Telephone Encounter (Signed)
Spoke with pt and states she sent the machine back because she feels she does not need this. I advised her of BQ recommendations and she stated if she doesn't get better she will call to request the machine. FYI BQ.

## 2017-08-18 NOTE — Telephone Encounter (Signed)
I want her to have an albuterol nebulizer machine to use three times a day for the next 5 days, then prn after that.

## 2017-08-18 NOTE — Telephone Encounter (Signed)
noted 

## 2017-08-19 ENCOUNTER — Telehealth: Payer: Self-pay | Admitting: Pulmonary Disease

## 2017-08-19 NOTE — Telephone Encounter (Signed)
Tried to reach pt but unable to get her. Left message for pt to call us back.

## 2017-08-24 NOTE — Telephone Encounter (Signed)
Pt is returning call. Call back number is [phone].

## 2017-08-24 NOTE — Progress Notes (Signed)
Letter sent to patient.

## 2017-08-24 NOTE — Telephone Encounter (Signed)
Notes recorded by Juanito Doom, MD on 08/18/2017 at 9:10 AM EST C, Please let the patient know this showed some chronic bronchitis changes. Thanks, B ---------------------------------------- lmtcb x2 for pt.

## 2017-08-24 NOTE — Telephone Encounter (Signed)
Spoke with pt, aware of results/recs.  Nothing further needed.  

## 2017-09-19 ENCOUNTER — Ambulatory Visit: Payer: Self-pay | Admitting: Adult Health

## 2017-12-21 ENCOUNTER — Telehealth: Payer: Self-pay | Admitting: Adult Health

## 2017-12-21 ENCOUNTER — Encounter: Payer: Self-pay | Admitting: Adult Health

## 2017-12-21 ENCOUNTER — Ambulatory Visit (INDEPENDENT_AMBULATORY_CARE_PROVIDER_SITE_OTHER): Payer: Medicare Other | Admitting: Adult Health

## 2017-12-21 DIAGNOSIS — J9611 Chronic respiratory failure with hypoxia: Secondary | ICD-10-CM

## 2017-12-21 DIAGNOSIS — J441 Chronic obstructive pulmonary disease with (acute) exacerbation: Secondary | ICD-10-CM

## 2017-12-21 MED ORDER — FLUTICASONE-UMECLIDIN-VILANT 100-62.5-25 MCG/INH IN AEPB: 1.0000 | INHALATION_SPRAY | Freq: Every day | RESPIRATORY_TRACT | 5 refills | Status: DC

## 2017-12-21 MED ORDER — AMOXICILLIN-POT CLAVULANATE 875-125 MG PO TABS: 1.0000 | ORAL_TABLET | Freq: Two times a day (BID) | ORAL | 0 refills | Status: DC

## 2017-12-21 MED ORDER — AMOXICILLIN-POT CLAVULANATE 875-125 MG PO TABS: 1.0000 | ORAL_TABLET | Freq: Two times a day (BID) | ORAL | 0 refills | Status: AC

## 2017-12-21 MED ORDER — PREDNISONE 10 MG PO TABS
ORAL_TABLET | ORAL | 0 refills | Status: DC
Start: 2017-12-21 — End: 2017-12-21

## 2017-12-21 MED ORDER — PREDNISONE 10 MG PO TABS: ORAL_TABLET | ORAL | 0 refills | Status: DC

## 2017-12-21 NOTE — Telephone Encounter (Signed)
Patient called back about medication Trelegy being too expensive and wanted to see if something else be sent to [facility] on [address] [facility]. CB is [phone]

## 2017-12-21 NOTE — Progress Notes (Signed)
Patient seen in the office today and instructed on use of Trelegy.  Patient expressed understanding and demonstrated technique. Parke Poisson, CMA 12/21/17

## 2017-12-21 NOTE — Patient Instructions (Addendum)
Augmentin 875mg  Twice daily  For 1 week ., take with food.  Prednisone taper over next week  Continue on Oxygen 2l/m . Goal is for O2 sat .88-90% Saline nasal rinses As needed   Stop Dulera  Begin TRELEGY 1 puff daily .  Follow up with .Dr. Lake Bells in 6-8 weeks and As needed   Please contact office for sooner follow up if symptoms do not improve or worsen or seek emergency care

## 2017-12-21 NOTE — Telephone Encounter (Signed)
LMTCB

## 2017-12-21 NOTE — Progress Notes (Signed)
@Patient  ID: [name], female    DOB: 04-25-1951, 67 y.o.   MRN: [id number]  Chief Complaint  Patient presents with  . Acute Visit    COPD     Referring provider: Mercy Riding, MD  HPI: 67 yo female followed for Moderate COPD   TEST  12/2013 PFT> Ratio 46%, FEV1  1.24L (54% pred, 44% change), TLC 6.17L (127% pred), DLCO 10.82 (48% pred),   Chest imaging: November 2016 CT chest images personally reviewed showing diffuse centrilobular emphysema Echo 07/2015 EF 55-60%, gr 1 DD . PAP 65 , LA mod dila./Mild RA dilation   12/21/2017 Acute OV  Patient presents for an acute office visit.  Patient complains of 3-4 weeks of increased cough, congestion , drainage , runny nose , ear pain , sinus congestion . Gets winded with activity .  Started back to wearing her oxygen during daytime over last month. Wants to get a POC unit to help with mobility .  Has not been taking her lasix .does want to urinate so much .   Not taking Spiriva. Was not able to afford . [name] Twice daily  Does not want to go to neb meds yet.  Still smoking , discussed smoking cessation .    No Known Allergies  Immunization History  Administered Date(s) Administered  . Pneumococcal Polysaccharide-23 12/25/2013    Past Medical History:  Diagnosis Date  . Abnormal weight gain 08/14/2009   Qualifier: Diagnosis of  By: Lindell Noe MD, Jeneen Rinks    . Acute respiratory failure ([place])   . Allergic rhinitis   . Anemia   . Breast cancer ([place]) 1997   s/p radiation and lumpectomy.  . CAP (community acquired pneumonia) 12/2013  . Chronic lower back pain   . COPD (chronic obstructive pulmonary disease) ([place])   . DVT (deep venous thrombosis) ([place]) ~ 2004   LLE  . Edema extremities    lower  . Fatigue   . Hypertension    "only when I get stressed" (09/24/2016)  . Hypoalbuminemia   . Hypocalcemia   . Macular degeneration   . Osteoarthritis   . Psoriasis   . Tobacco abuse     Tobacco History: Social History     Tobacco Use  Smoking Status Current Every Day Smoker  . Packs/day: 1.50  . Years: 40.00  . Pack years: 60.00  . Types: Cigarettes  . Last attempt to quit: 12/15/2013  . Years since quitting: 4.0  Smokeless Tobacco Never Used  Tobacco Comment   started smoking again 09/2016 smoking 3-4cigs per day   Ready to quit: Not Answered Counseling given: Not Answered Comment: started smoking again 09/2016 smoking 3-4cigs per day   Outpatient Encounter Medications as of 12/21/2017  Medication Sig  . albuterol (ACCUNEB) 0.63 MG/3ML nebulizer solution Take 3 mLs (0.63 mg total) by nebulization every 6 (six) hours as needed for wheezing.  . Ascorbic Acid (VITAMIN C) 1000 MG tablet Take 1,000 mg by mouth 3 (three) times daily as needed.  Marland Kitchen aspirin 81 MG chewable tablet Chew 1 tablet (81 mg total) by mouth daily.  . furosemide (LASIX) 20 MG tablet Take 1 tablet (20 mg total) by mouth daily.  . mometasone-formoterol (DULERA) 100-5 MCG/ACT AERO Inhale 2 puffs into the lungs 2 (two) times daily.  . Multiple Vitamin (MULTIVITAMIN WITH MINERALS) TABS tablet Take 1 tablet by mouth daily.  Marland Kitchen oxyCODONE (ROXICODONE) 5 MG immediate release tablet Take 1 tablet (5 mg total) by mouth every 8 (eight)  hours as needed for breakthrough pain.  . [DISCONTINUED] doxycycline (VIBRA-TABS) 100 MG tablet Take 1 tablet (100 mg total) by mouth daily.  . [DISCONTINUED] predniSONE (DELTASONE) 20 MG tablet Take 1 tablet (20 mg total) by mouth daily with breakfast.   No facility-administered encounter medications on file as of 12/21/2017.      Review of Systems  Constitutional:   No  weight loss, night sweats,  Fevers, chills,  +fatigue, or  lassitude.  HEENT:   No headaches,  Difficulty swallowing,  Tooth/dental problems, or  Sore throat,                No sneezing, itching, ear ache,  +nasal congestion, post nasal drip,   CV:  No chest pain,  Orthopnea, PND, swelling in lower extremities, anasarca, dizziness,  palpitations, syncope.   GI  No heartburn, indigestion, abdominal pain, nausea, vomiting, diarrhea, change in bowel habits, loss of appetite, bloody stools.   Resp:    No chest wall deformity  Skin: no rash or lesions.  GU: no dysuria, change in color of urine, no urgency or frequency.  No flank pain, no hematuria   MS:  No joint pain or swelling.  No decreased range of motion.  No back pain.    Physical Exam  BP (!) 150/82 (BP Location: Left Arm, Cuff Size: Normal)   Pulse 87   Ht 5\' 5"  (1.651 m)   Wt 238 lb (108 kg)   SpO2 91%   BMI 39.61 kg/m   GEN: A/Ox3; pleasant , NAD, elderly    HEENT:  [place]/AT,  EACs-clear, TMs-wnl, NOSE-clear, THROAT-clear, no lesions, no postnasal drip or exudate noted.   NECK:  Supple w/ fair ROM; no JVD; normal carotid impulses w/o bruits; no thyromegaly or nodules palpated; no lymphadenopathy.    RESP  Few trace rhonchi ,  no accessory muscle use, no dullness to percussion  CARD:  RRR, no m/r/g, no peripheral edema, pulses intact, no cyanosis or clubbing.  GI:   Soft & nt; nml bowel sounds; no organomegaly or masses detected.   Musco: Warm bil, no deformities or joint swelling noted.   Neuro: alert, no focal deficits noted.    Skin: Warm, no lesions or rashes    Lab Results:  CBC  BMET  BNP   ProBNP  Imaging: No results found.   Assessment & Plan:   No problem-specific Assessment & Plan notes found for this encounter.     Rexene Edison, NP 12/21/2017

## 2017-12-22 NOTE — Addendum Note (Signed)
Addended by: Parke Poisson E on: 12/22/2017 04:03 PM   Modules accepted: Orders

## 2017-12-22 NOTE — Telephone Encounter (Signed)
Per TP: sorry to hear that.  She can go back on the [place] (she just got approved for Spiriva patient assistance).   Thank you.

## 2017-12-22 NOTE — Assessment & Plan Note (Signed)
Flare   Plan  Patient Instructions  Augmentin 875mg  Twice daily  For 1 week ., take with food.  Prednisone taper over next week  Continue on Oxygen 2l/m . Goal is for O2 sat .88-90% Saline nasal rinses As needed   Stop Dulera  Begin TRELEGY 1 puff daily .  Follow up with .Dr. Lake Bells in 6-8 weeks and As needed   Please contact office for sooner follow up if symptoms do not improve or worsen or seek emergency care

## 2017-12-22 NOTE — Assessment & Plan Note (Signed)
Cont on O2 with activity and At bedtime  .  Order for POC .

## 2017-12-22 NOTE — Telephone Encounter (Signed)
Attempted to call patient, no answer, left message to call back.  

## 2017-12-23 NOTE — Telephone Encounter (Signed)
Spoke with pt to let her know to go back to dulera and spiriva since Trelegy was too expensive for her.  When stated to pt to go back to dulera and spiriva, pt stated to me she was on the company program and Ruthe Mannan was costing her almost $400 which she could not approve.  Pt states she is on the pt assistance program for the dulera and she states she thinks we have to renew the prescription and pt assistance paperwork.  Jess, please advise if pt has to call to get pt assistance paperwork updated or if it is something we take care of for pt?

## 2017-12-26 NOTE — Telephone Encounter (Signed)
Attempted to call the pt. I did not receive an answer. I have left a message for the pt to return our call.   Does she need patient assistance for [facility]? She has already been approved for Spiriva.

## 2017-12-27 NOTE — Telephone Encounter (Signed)
From [employer] Website:  A single application may include prescriptions for up to 3 [employer] medicines. Each prescription may not exceed a 90-day supply at a time, with a maximum of 3 refills. Each application is valid for up to 12 months; after 12 months a new application will be required. Under certain circumstances, enrollment may be limited to a calendar year.   Spoke with patient. She stated that she currently has one more shipment that is scheduled to ship in June. She wishes to go ahead and reapply in order to continue her therapy.   Advised patient that I would go ahead and fill out our portion and mail it to her. Verified her address. Will place in the mail today.   Nothing else needed at time of call.

## 2018-01-19 IMAGING — CT CT HEAD CODE STROKE
3 of 4 series · 17 of 47 positions shown, 20 images · non-contrast
Comparison: None.

CLINICAL DATA: Code stroke.  Unresponsive patient.

EXAM:
CT HEAD WITHOUT CONTRAST
TECHNIQUE: Contiguous axial images were obtained from the base of the skull
through the vertex without intravenous contrast.

[Series 201: head w/o, idose (1) · axial · non-contrast · 0.43mm/px · z∈[+82,+217]mm · 11 of 33 slices shown, 14 images]
[im 3/33  brain]
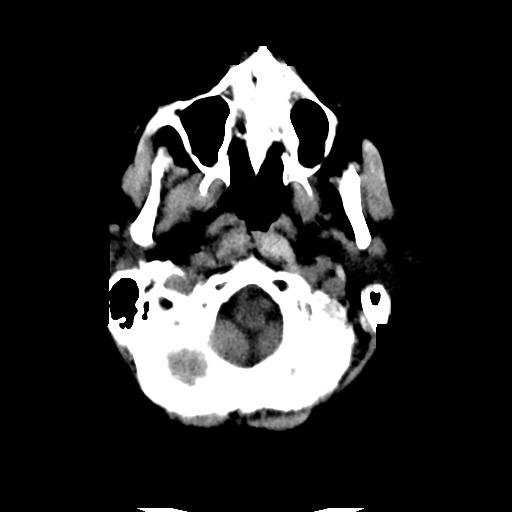
[im 3/33  bone]
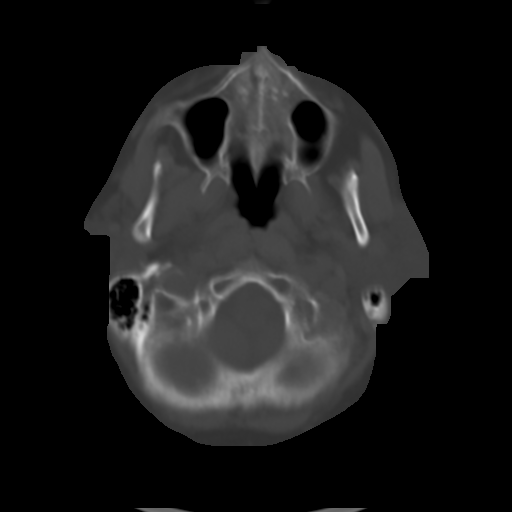
[im 5/33  brain]
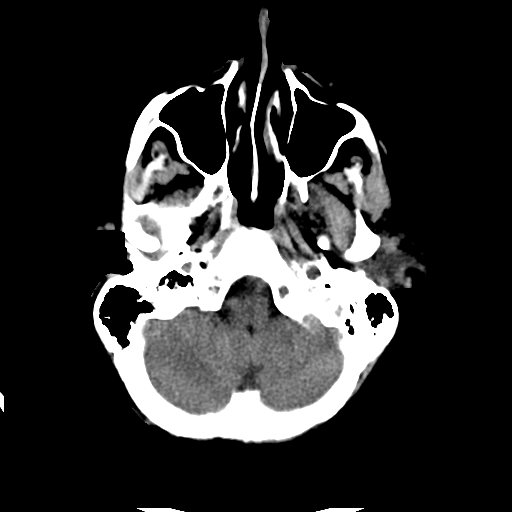
[im 7/33  brain]
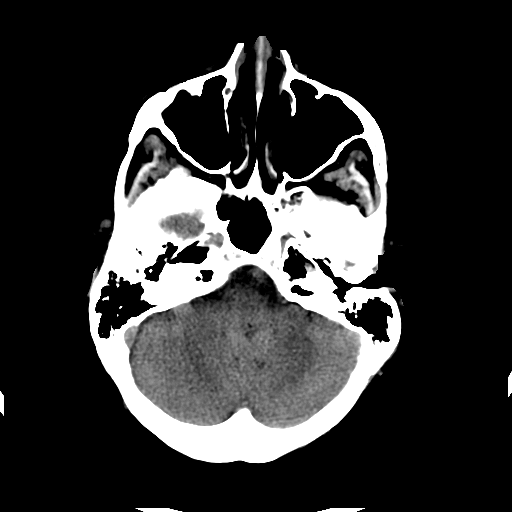
[im 12/33  brain]
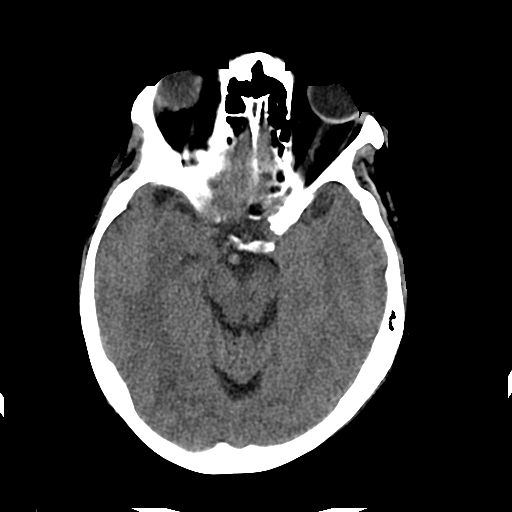
[im 14/33  brain]
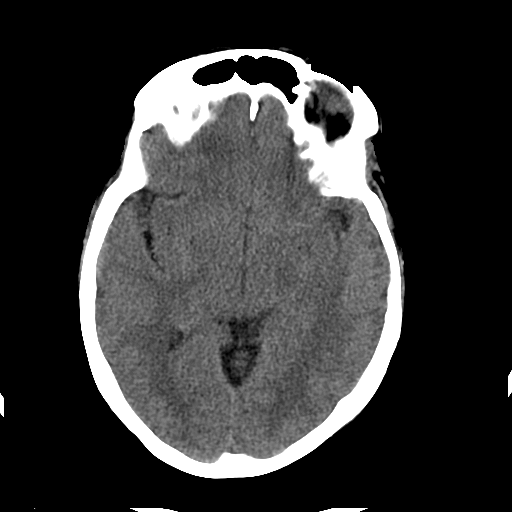
[im 14/33  bone]
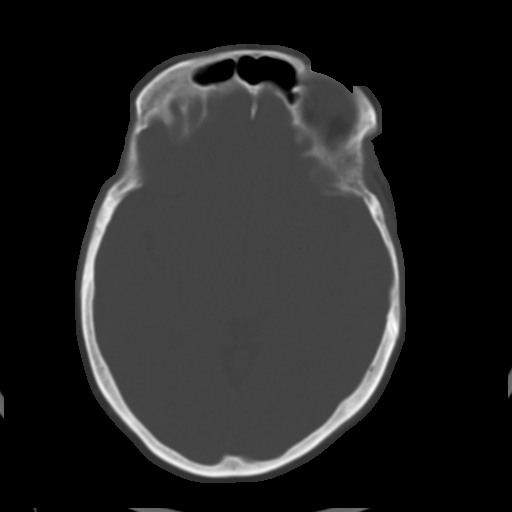
[im 17/33  brain]
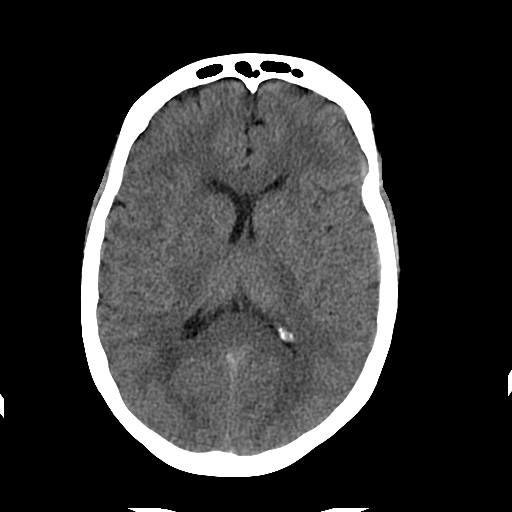
[im 19/33  brain]
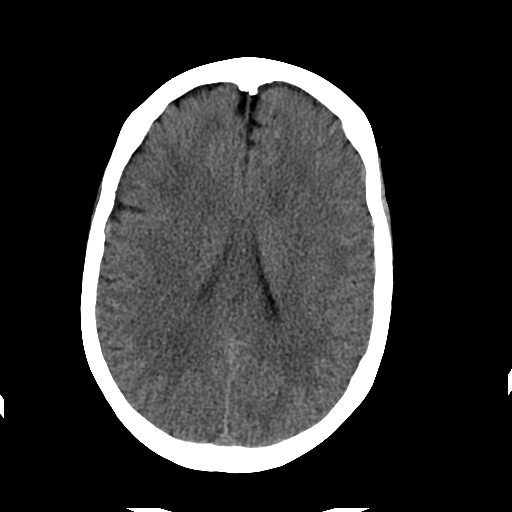
[im 21/33  brain]
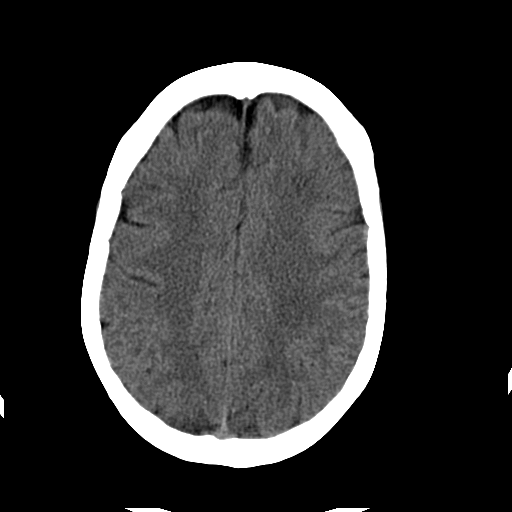
[im 26/33  brain]
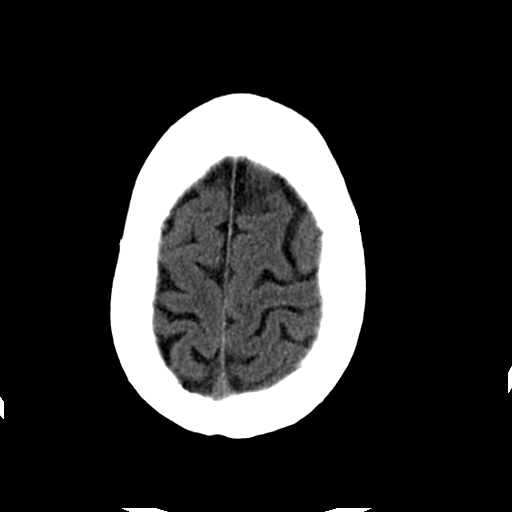
[im 26/33  bone]
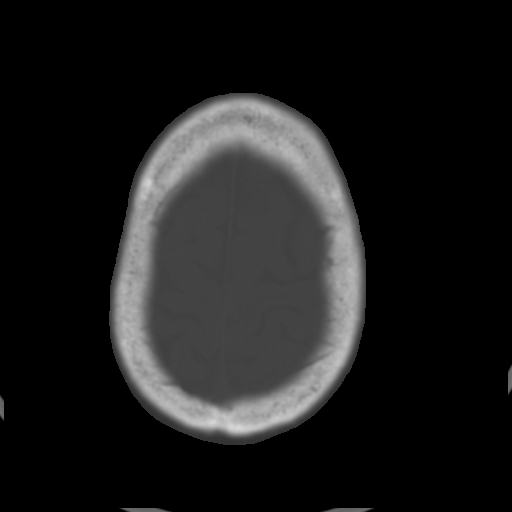
[im 28/33  brain]
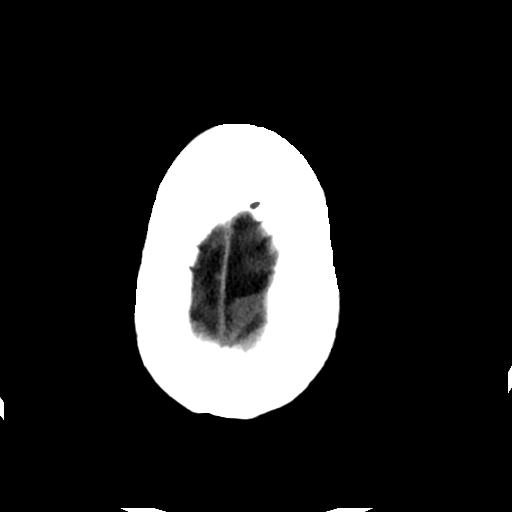
[im 30/33  brain]
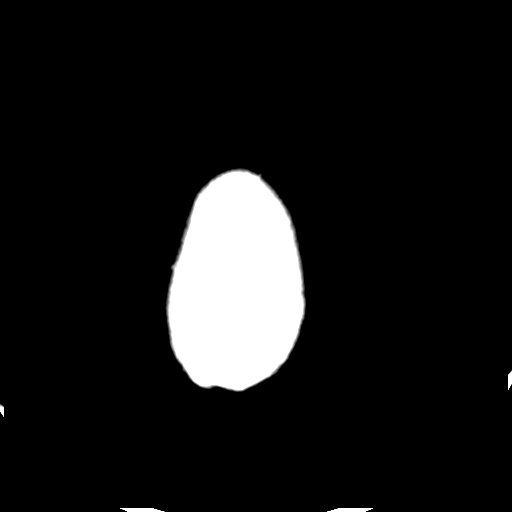

[Series 203: coronal st, idose (1) · coronal · 0.40mm/px · 3 of 73 slices shown]
[im 25/73  brain]
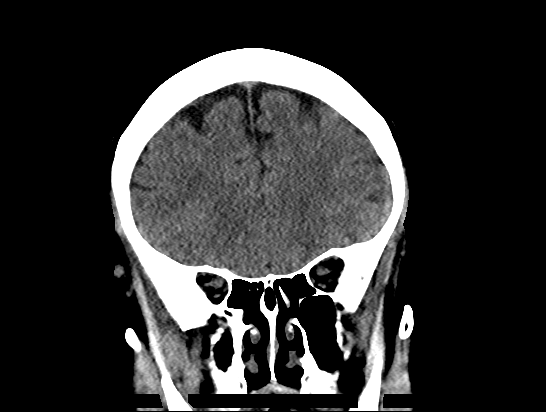
[im 33/73  brain]
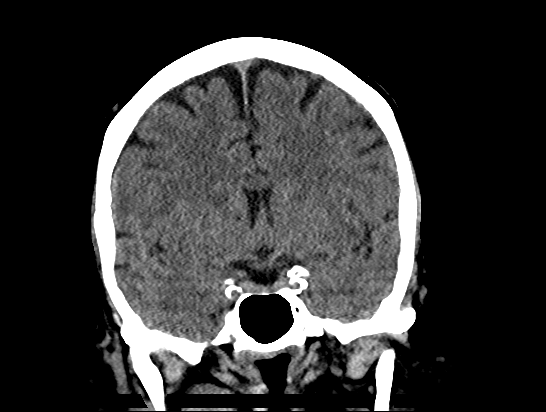
[im 41/73  brain]
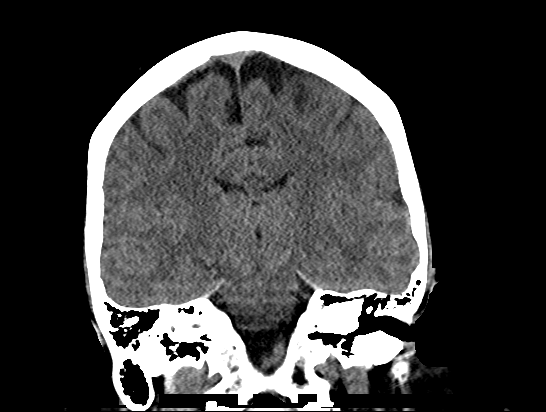

[Series 204: sagittal st, idose (1) · sagittal · 0.40mm/px · 3 of 73 slices shown]
[im 25/73  brain]
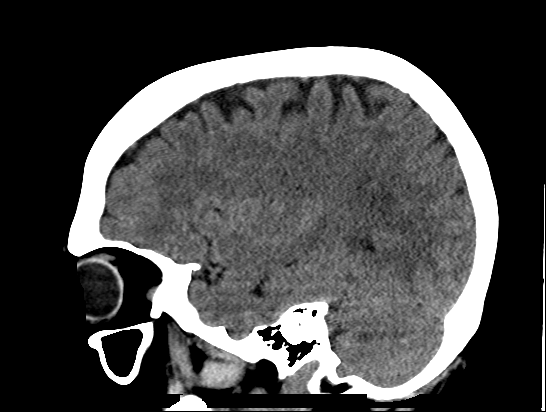
[im 37/73  brain]
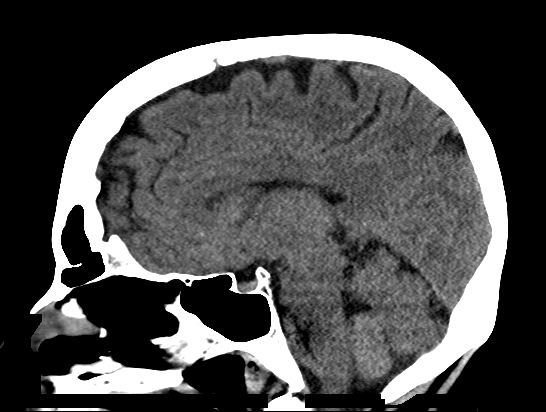
[im 49/73  brain]
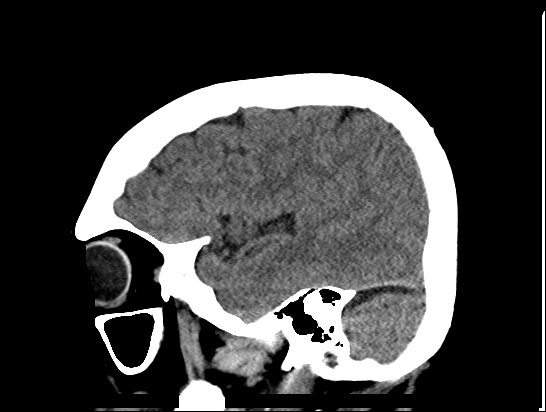

[17 of 47 positions shown; findings below may reference images not displayed]

FINDINGS: Brain: No mass lesion, intraparenchymal hemorrhage or extra-axial
collection. No evidence of acute cortical infarct. There is
periventricular hypoattenuation compatible with chronic
microvascular disease.

Vascular: No hyperdense vessel or unexpected calcification.

Skull: Normal visualized skull base, calvarium and extracranial soft
tissues.

Sinuses/Orbits: No sinus fluid levels or advanced mucosal
thickening. No mastoid effusion. Normal orbits.

ASPECTS (Alberta Stroke Program Early CT Score)

- Ganglionic level infarction (caudate, lentiform nuclei, internal
capsule, insula, M1-M3 cortex): 7

- Supraganglionic infarction (M4-M6 cortex): 3

Total score (0-10 with 10 being normal): 10
IMPRESSION: 1. Chronic microvascular ischemia without acute intracranial
abnormality.
2. ASPECTS is 10.
These results were called by telephone at the time of interpretation
on 09/26/2016 at [DATE] to Dr. Bojan Pla , who verbally
acknowledged these results.

## 2018-01-19 IMAGING — CR DG CHEST 1V PORT
1 series · 1 of 1 positions shown · non-contrast
Comparison: 09/24/2016 and chest CTA dated 09/25/2016.

CLINICAL DATA: Acute respiratory distress.

EXAM:
PORTABLE CHEST 1 VIEW

[AP]
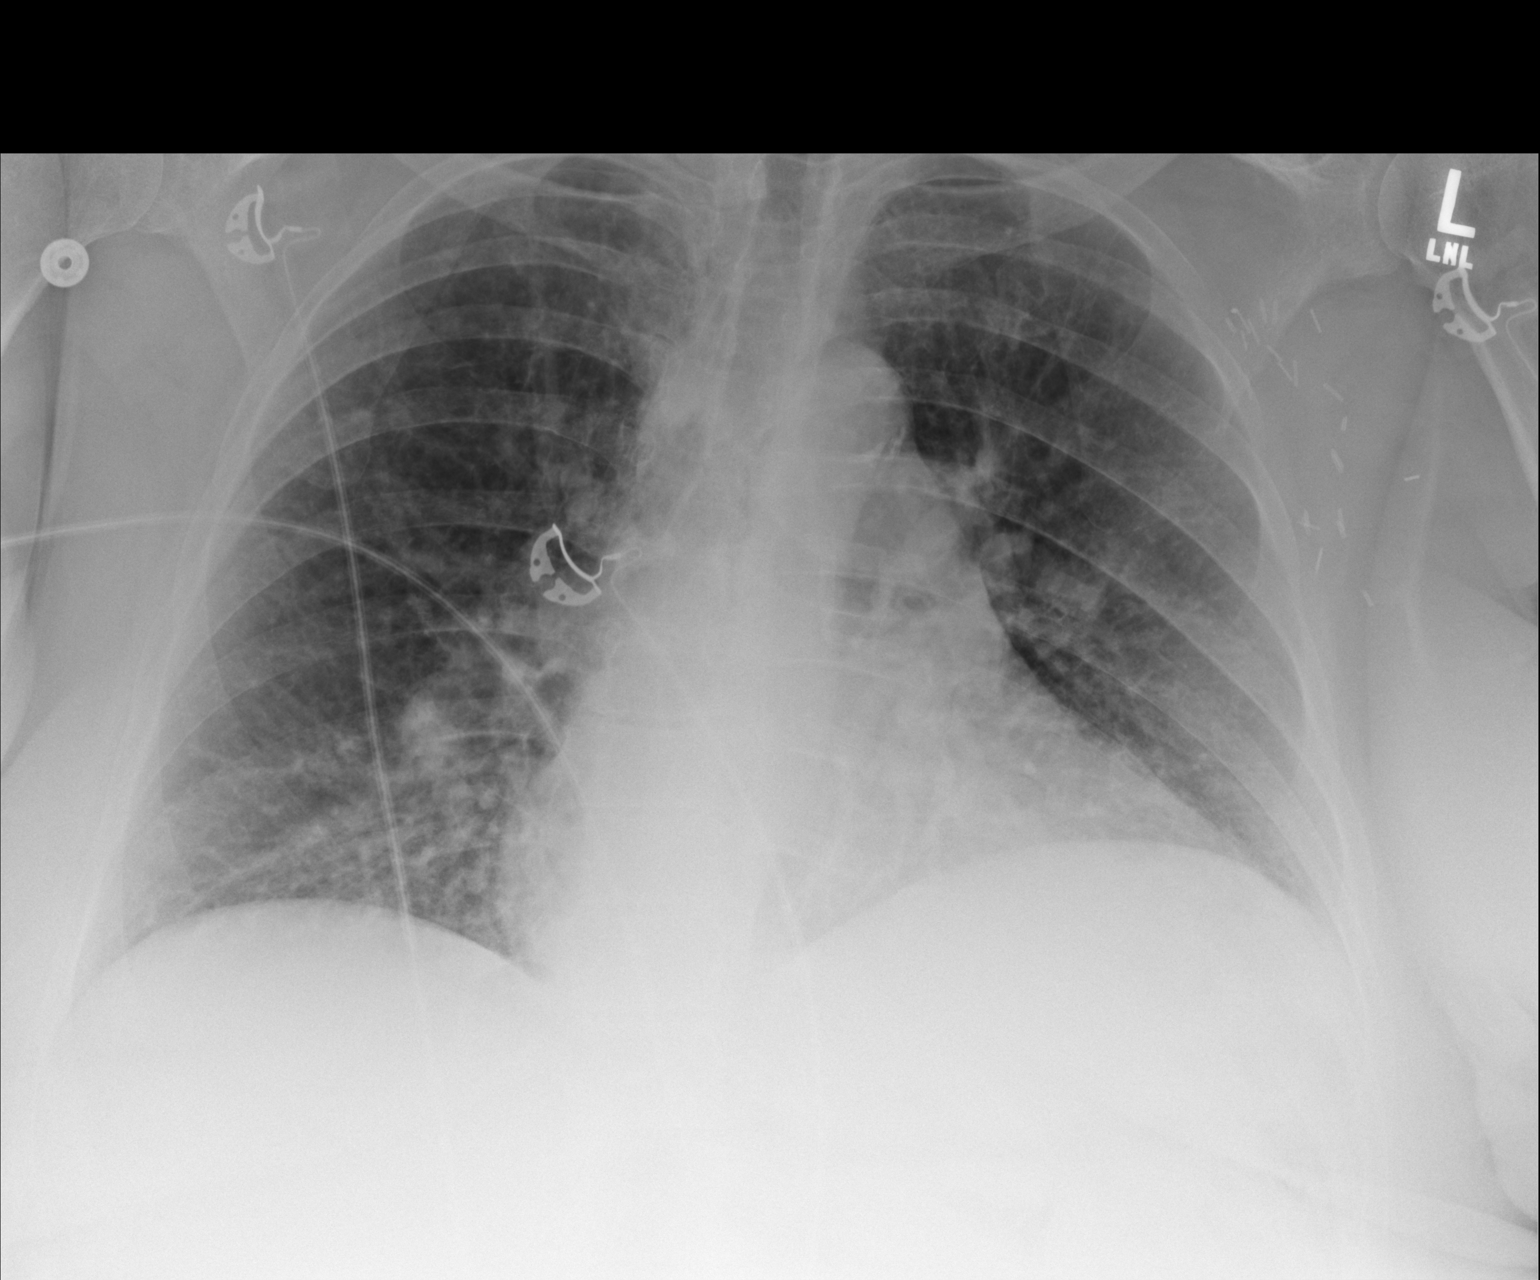

[1 of 1 positions shown; findings below may reference images not displayed]

FINDINGS: Stable enlarged cardiac silhouette and prominent pulmonary
vasculature and interstitial markings. Left axillary surgical clips
are again demonstrated. Aortic arch calcifications. Unremarkable
bones.
IMPRESSION: 1. Stable cardiomegaly and pulmonary vascular congestion.
2. Stable chronic interstitial lung disease with possible mild
interstitial pulmonary edema.
3. Aortic atherosclerosis.

## 2018-02-14 ENCOUNTER — Ambulatory Visit: Payer: Self-pay | Admitting: Pulmonary Disease

## 2018-03-09 ENCOUNTER — Telehealth: Payer: Self-pay | Admitting: Pulmonary Disease

## 2018-03-09 NOTE — Telephone Encounter (Signed)
Forms discovered up front and placed in BQ's lookat Routing to BQ and Burman Nieves

## 2018-03-14 NOTE — Telephone Encounter (Signed)
Patient has completed forms for [facility] and Spiriva assistance. I see where she has been taking Dulera 2 puffs twice a day but I don't see an Rx for Spiriva.   BQ please advise if you would like patient to use Spiriva and if so respimat or handihaler and dose please. Thank you!

## 2018-03-15 NOTE — Telephone Encounter (Signed)
I'm OK with either.  Ask the patient which form she has been taking and then prescribe it please.

## 2018-03-15 NOTE — Telephone Encounter (Signed)
Attempted to call pt. I did not receive an answer. I have left a message for pt to return our call.  

## 2018-03-15 NOTE — Telephone Encounter (Signed)
Called and spoke to patient. She is using Spiriva 18 mcg cap inhaled once daily. Paperwork for patient assistance is completed and being faxed today. Nothing further is needed.

## 2018-03-22 ENCOUNTER — Ambulatory Visit: Payer: Self-pay | Admitting: Pulmonary Disease

## 2018-03-28 ENCOUNTER — Telehealth: Payer: Self-pay | Admitting: Pulmonary Disease

## 2018-03-28 NOTE — Telephone Encounter (Signed)
Called and spoke with pt regarding her pt assistance forms for spirvia 18cmg and durlera. Pt is requesting samples; advised pt that we do not have any at this time Pt was in need of update on the paperwork Advised her that paperwork has been signed and faxed to both companies on 03/15/2018  Called and spoke with Abigail Butts with [employer] at [phone] regarding pt's spirvia 18 cmg inhaler pt assistance forms that were faxed back on 03/15/18. Abigail Butts stated that they did receive the fax back on 03/15/18, and it will take approximately 14 days to complete process, and then pt will be called with results.   Called and spoke with [facility] with [employer] at phone [phone] regarding pt's assistance forms for durlera inhaler that was faxed on 03/15/18. She advised that is takes 10-14 days to process the request of the fax. She did advise the fax was rec'd and it is in process.  Called and spoke with pt to provide her with update with what both companies reported. Advised pt since she is out of spirvia completely to call [employer] at [phone] to see if they could expedite her request and maybe send her a sample of sprivia since we do not have one in office. Pt advised that she will call the company today. Nothing further needed at this time.

## 2018-04-10 ENCOUNTER — Telehealth: Payer: Self-pay | Admitting: Pulmonary Disease

## 2018-04-10 ENCOUNTER — Telehealth: Payer: Self-pay | Admitting: Adult Health

## 2018-04-10 NOTE — Telephone Encounter (Signed)
Called and spoke with patient; please see other message for today.

## 2018-04-10 NOTE — Telephone Encounter (Signed)
Called and spoke with patient regarding samples of Durlera and Spirivia. At this time, no samples are available, pt is aware  Pt advised an update on assistance forms for durlera and spirivia called and spoke with Amora at [facility] regarding [place] at phone [phone] She advised that they are not able to receive faxed pt assistance forms at this time;  they have to be the original documents and have to be mailed in to [facility] She is mailing original application to patient's home address in today's mail Once pt receives the application, she will complete front side, and return to our office to complete the rest  Called and spoke with Mickel Baas at [employer] at phone [phone] regarding spirvia She advised the the fax was not sent, advised her that we faxed this form 3 times, today is the fourth She states that now she did receive the fax last week, and the application is in process She advised it will take 14-18 days for decision. Will advise at that time. Durward Fortes Mickel Baas that pt is out of this medication for last 2 weeks, needing it as soon as possible.  Called and spoke with patient with update of all above information. Pt advised she will call next week for update.

## 2018-04-11 NOTE — Telephone Encounter (Signed)
Called and spoke with Aniya at [employer] at phone [phone] regarding spirvia She states they rec'd the fax on 03/09/2018 and on 03/28/18 for patient assistance  At this time since they are a call center the phones are top priority not the assistance paperwork They are over 6-8 weeks behind, and states it will take up to 8-10 weeks for an answer of approval I asked Aniya if there was a way she could provide samples to pt has she is out of spirivia last 2 weeks, she states no. I asked if this can be expedited, she states that it cannot at this time. She will update pt once the application is in process. Will f/u in 2 weeks.

## 2018-04-18 NOTE — Telephone Encounter (Signed)
Nothing further triage can do at this point in time Will sign and route to myself and Burman Nieves to ensure follow up

## 2018-04-20 ENCOUNTER — Telehealth: Payer: Self-pay | Admitting: Adult Health

## 2018-04-20 NOTE — Telephone Encounter (Signed)
Called patient unable to reach left message to give us a call back.

## 2018-04-21 NOTE — Telephone Encounter (Signed)
Called and spoke with Nishia at [employer] at phone [phone] regarding pt asst forms on spirvia. Advised her that we have sent 3 separate faxes to them on this pt for pt assistance forms Faxed was confirmed on 02/12/18, 03/09/18, 03/28/2018 Spoke with Aniaya on 04/11/2018 she states she is a call center the phones are top priority not the assistance paperwork They are over 6-8 weeks behind, and states it will take up to 8-10 weeks for an answer of approval I asked Aniya if there was a way she could provide samples to pt has she is out of spirivia last 2 weeks, she states no.  Per Zadie Cleverly she said to fax the pt assistance forms to fax [phone] Faxed confirmation rec'd today at 2:26pm; she said to call [phone] and check to see if it was rec'd Will f/u on Monday 04/24/18  At this time there is no Durlera 100 nor Spiriva handiheld inhalers samples available for patient. Pt is currently out of both inhalers. Merek on 04/11/2018 was mailing pt original copy of pt assistance forms to pt. At this pt has not rec'd any paperwork for her Durlera 100 pt assistance forms  Pt requesting if there is any samples patient can be on in the mean time to help her. BQ please advise.  BQ is out of the office until 05/01/18

## 2018-04-24 ENCOUNTER — Encounter: Payer: Self-pay | Admitting: Student in an Organized Health Care Education/Training Program

## 2018-04-24 ENCOUNTER — Ambulatory Visit (INDEPENDENT_AMBULATORY_CARE_PROVIDER_SITE_OTHER): Payer: Medicare Other | Admitting: Student in an Organized Health Care Education/Training Program

## 2018-04-24 ENCOUNTER — Other Ambulatory Visit: Payer: Self-pay

## 2018-04-24 VITALS — BP 152/78 | HR 69 | Temp 99.1°F | Ht 65.0 in | Wt 234.6 lb

## 2018-04-24 DIAGNOSIS — Z853 Personal history of malignant neoplasm of breast: Secondary | ICD-10-CM | POA: Diagnosis not present

## 2018-04-24 DIAGNOSIS — Z1231 Encounter for screening mammogram for malignant neoplasm of breast: Secondary | ICD-10-CM

## 2018-04-24 DIAGNOSIS — Z0000 Encounter for general adult medical examination without abnormal findings: Secondary | ICD-10-CM | POA: Diagnosis not present

## 2018-04-24 DIAGNOSIS — I10 Essential (primary) hypertension: Secondary | ICD-10-CM

## 2018-04-24 DIAGNOSIS — Z79899 Other long term (current) drug therapy: Secondary | ICD-10-CM

## 2018-04-24 DIAGNOSIS — Z1239 Encounter for other screening for malignant neoplasm of breast: Secondary | ICD-10-CM

## 2018-04-24 MED ORDER — FLUTICASONE-UMECLIDIN-VILANT 100-62.5-25 MCG/INH IN AEPB: 1.0000 | INHALATION_SPRAY | Freq: Every day | RESPIRATORY_TRACT | 5 refills | Status: DC

## 2018-04-24 MED ORDER — LOSARTAN POTASSIUM 50 MG PO TABS: 50.0000 mg | ORAL_TABLET | Freq: Every day | ORAL | 3 refills | Status: DC

## 2018-04-24 NOTE — Progress Notes (Signed)
Tammy Coombe, MD, MS Phone: [phone]  Subjective:  CC -- Annual Physical; With complaints of lump under right ear and can hear heart beat in her ear  Small Cervical Mass Patient reports she has had a lump under her right ear for several weeks, since april. Initially she felt it was associated with a sinus infection. She is s/p antibiotics and prednisone from her pulmonologist. The infection resolved but the bump has persisted. It is described as soft, non-tender, and mobile. She additionally hears her heart beat in her right ear and feels this may be associated. No fevers, night sweats or weight loss.   Cardiovascular: - Risk as of 04/2018: 11.3%: high intensity statin is recommended - Dx Hypertension: yes  - Dx Hyperlipidemia: yes  - Dx Obesity: Class II Obesity - Physical Activity: yes  - Diabetes: no - consider screening at next visit  Cancer: Colorectal >> Colonoscopy: no, information provided for her to schedule an appointment Breast >> Mammogram: information provided  Skin >> Suspicious lesions: no   ROS-  Past Medical History Patient Active Problem List   Diagnosis Date Noted  . PAH (pulmonary artery hypertension) ([place]) 10/06/2016  . COPD with acute exacerbation ([place])   . Other emphysema ([place]) 12/31/2015  . Tobacco use disorder 12/29/2015  . Cough 09/12/2015  . Chronic respiratory failure ([place]) 08/09/2015  . Diastolic congestive heart failure ([place])   . Noncompliance with medications   . Anemia, iron deficiency 10/26/2011  . Fatigue 10/26/2011  . Osteoarthritis 01/04/2011  . ALLERGIC RHINITIS CAUSE UNSPECIFIED 08/14/2009  . Morbid obesity ([place]) 08/14/2009  . Hypertension 08/14/2009  . PSORIASIS 12/06/2008  . BREAST CANCER, HX OF 12/06/2008    Medications- reviewed and updated Current Outpatient Medications  Medication Sig Dispense Refill  . albuterol (ACCUNEB) 0.63 MG/3ML nebulizer solution Take 3 mLs (0.63 mg total) by nebulization every 6 (six) hours as  needed for wheezing. 75 mL 12  . Ascorbic Acid (VITAMIN C) 1000 MG tablet Take 1,000 mg by mouth 3 (three) times daily as needed.    Marland Kitchen aspirin 81 MG chewable tablet Chew 1 tablet (81 mg total) by mouth daily. 30 tablet 0  . Fluticasone-Umeclidin-Vilant (TRELEGY ELLIPTA) 100-62.5-25 MCG/INH AEPB Inhale 1 puff into the lungs daily. 1 each 5  . furosemide (LASIX) 20 MG tablet Take 1 tablet (20 mg total) by mouth daily. 30 tablet 1  . losartan (COZAAR) 50 MG tablet Take 1 tablet (50 mg total) by mouth daily. 90 tablet 3  . Multiple Vitamin (MULTIVITAMIN WITH MINERALS) TABS tablet Take 1 tablet by mouth daily.    Marland Kitchen oxyCODONE (ROXICODONE) 5 MG immediate release tablet Take 1 tablet (5 mg total) by mouth every 8 (eight) hours as needed for breakthrough pain. 15 tablet 0  . predniSONE (DELTASONE) 10 MG tablet 4 tabs for 2 days, then 3 tabs for 2 days, 2 tabs for 2 days, then 1 tab for 2 days, then stop 20 tablet 0   No current facility-administered medications for this visit.     Objective: BP (!) 152/78   Pulse 69   Temp 99.1 F (37.3 C) (Oral)   Ht 5\' 5"  (1.651 m)   Wt 234 lb 9.6 oz (106.4 kg)   SpO2 92%   BMI 39.04 kg/m  Gen: NAD, alert, cooperative with exam HEENT: NCAT, EOMI, PERRL NECK: Soft, nontender, mobile mass noted near the right sternocleidmastoid. ~3 cm long.  CV: RRR, good S1/S2, no murmur Resp: CTABL, no wheezes, non-labored Abd: Soft, Non  Tender, Non Distended, BS present, no guarding or organomegaly Genital Exam: not done Ext: No edema, warm Neuro: Alert and oriented, No gross deficits  Assessment/Plan:  COPD - follows with pulm. Asks for samples of trelegy because she has been unable to get this medication through insurance. She was given samples.  HTN - discussed adding another antihypertensive agent for the patient. She would prefer to attempt lifestyle changes. She should follow up for another visit/blood pressure check in one month. Continue Cozaar  HLD - drew  lipid panel at this visit. ASCVD risk 11.3%. Will plan to call patient and discuss adding statin.   Lipoma - small mobile mass under her right ear was ultrasounded in the office. Seems to be most consistent with lipoma. Nonpainful. Low likelihood to be a lymph node based on this, however did advise patient that we can send her for a formal ultrasound to fully evaluate. She declines this today but will continue to consider. In the meantime we will update her age-appropriate cancer screening as noted below.  Health maintenance - dexa, colonoscopy, mammo ordered at today's visit.  Orders Placed This Encounter  Procedures  . HM DEXA SCAN  . MM Digital Diagnostic Bilat    PF @ [facility] 03/29/13 ??    Standing Status:   Future    Standing Expiration Date:   06/25/2019    Order Specific Question:   Reason for Exam (SYMPTOM  OR DIAGNOSIS REQUIRED)    Answer:   screen for breast cancer    Order Specific Question:   Preferred imaging location?    Answer:   [facility]  . Lipid panel    Order Specific Question:   Has the patient fasted?    Answer:   No  . Basic metabolic panel    Order Specific Question:   Has the patient fasted?    Answer:   No  . Hepatitis C antibody    Meds ordered this encounter  Medications  . Fluticasone-Umeclidin-Vilant (TRELEGY ELLIPTA) 100-62.5-25 MCG/INH AEPB    Sig: Inhale 1 puff into the lungs daily.    Dispense:  1 each    Refill:  5  . losartan (COZAAR) 50 MG tablet    Sig: Take 1 tablet (50 mg total) by mouth daily.    Dispense:  90 tablet    Refill:  3     Tammy Coombe, MD,MS,  PGY1 04/29/2018 4:19 PM

## 2018-04-24 NOTE — Progress Notes (Signed)
Pt given 2 boxes of Trelegy-62.32mcg/100mcg/25,mcg.  14 day supply in each [id number] Exp-04/2019

## 2018-04-24 NOTE — Patient Instructions (Signed)
It was a pleasure seeing you today in our clinic. Here is the treatment plan we have discussed and agreed upon together:  Please schedule nursing follow up in 2 weeks for blood pressure check. Bring your blood pressure cuff to this visit. Please schedule a lab visit to have blood drawn on the same day.   We drew blood work at today's visit. I will call or send you a letter with these results. If you do not hear from me within the next week, please give our office a call.  Our clinic's number is [phone]. Please call with questions or concerns about what we discussed today.  Be well, Dr. Burr Medico

## 2018-04-25 ENCOUNTER — Telehealth: Payer: Self-pay

## 2018-04-25 LAB — BASIC METABOLIC PANEL
BUN/Creatinine Ratio: 25 (ref 12–28)
BUN: 15 mg/dL (ref 8–27)
CO2: 26 mmol/L (ref 20–29)
Calcium: 8.5 mg/dL — ABNORMAL LOW (ref 8.7–10.3)
Chloride: 103 mmol/L (ref 96–106)
Creatinine, Ser: 0.6 mg/dL (ref 0.57–1.00)
GFR calc Af Amer: 108 mL/min/{1.73_m2} (ref >59)
GFR calc non Af Amer: 93 mL/min/{1.73_m2} (ref >59)
Glucose: 75 mg/dL (ref 65–99)
Potassium: 4.6 mmol/L (ref 3.5–5.2)
Sodium: 142 mmol/L (ref 134–144)

## 2018-04-25 LAB — LIPID PANEL
Chol/HDL Ratio: 3.2 ratio (ref 0.0–4.4)
Cholesterol, Total: 185 mg/dL (ref 100–199)
HDL: 58 mg/dL (ref >39)
LDL Calculated: 107 mg/dL — ABNORMAL HIGH (ref 0–99)
Triglycerides: 100 mg/dL (ref 0–149)
VLDL Cholesterol Cal: 20 mg/dL (ref 5–40)

## 2018-04-25 LAB — HEPATITIS C ANTIBODY: Hep C Virus Ab: <0.1 s/co ratio (ref 0.0–0.9)

## 2018-04-25 NOTE — Telephone Encounter (Signed)
Spoke with Fabio Bering at [facility], verified that Lannette Donath is working on case and it is being processed, advised that we call back in another 2 days to check status of application.    Will hold for follow-up.

## 2018-04-25 NOTE — Telephone Encounter (Signed)
Patient left message that she thought pain medication was going to be refilled yesterday. Stated she did not get printed Rx. Does not appear to have been sent. Note routed to PCP.  Call back is [phone]  Danley Danker, RN [facility] [place])

## 2018-04-25 NOTE — Telephone Encounter (Signed)
She did not bring up chronic pain at our office visit and the last refill I can see on this medicine was for 15 tabs in 2018. She would require another office visit to address chronic pain.

## 2018-04-25 NOTE — Telephone Encounter (Signed)
Called and spoke with [employer] at [phone] to check on status of pt asst forms Faxed the forms for fourth time on 04/21/08 at 2:26pm to [phone] She advised that it takes more than 7 business days for decision. At this time she shows no fax rec'd. She is having supervisor call back today by 3pm regarding status of this pt's faxes sent. Will await phone call.

## 2018-04-25 NOTE — Telephone Encounter (Signed)
Lannette Donath returning call  [phone]. No ext.

## 2018-04-27 DIAGNOSIS — H2513 Age-related nuclear cataract, bilateral: Secondary | ICD-10-CM | POA: Diagnosis not present

## 2018-04-27 NOTE — Telephone Encounter (Signed)
Called and spoke with Bre with [phone] to check on status of pt asst forms She advised that they are in need of the first and second page of the fax resent today Faxed these two pages to fax [phone], confirmation went through ok Called supervisor [place] at [phone]; she advised fax was rec'd today She will call with approval within 24hrs. Will await call from [facility]

## 2018-04-28 ENCOUNTER — Encounter: Payer: Self-pay | Admitting: Student in an Organized Health Care Education/Training Program

## 2018-04-28 ENCOUNTER — Other Ambulatory Visit: Payer: Self-pay | Admitting: Family Medicine

## 2018-04-28 NOTE — Telephone Encounter (Signed)
Spoke with pt. She is aware that she should receive Spiriva on Monday. Nothing further was needed.

## 2018-04-28 NOTE — Telephone Encounter (Signed)
Called supervisor [place] at [phone]; she advised fax was rec'd today It is still being processed today, should here something by next week Will await a call from [place] next week.

## 2018-04-28 NOTE — Telephone Encounter (Signed)
Informed pt of below and she said "that she did talk to the doctor about this and said she must not have heard her." She stated that she thought the physical covered everything like breast exam and pap.  I told her that per guidelines we don't perform paps after the age of 63 unless requested or if there are previously abnormal paps or medically necessary. Also informed her that Mammogram was ordered at this visit.   She said she was not making another appointment cause she just had an appointment.  I reminded her that in order to get any pain medication it would require a visit to discuss her chronic pain. Routing to PCP as an [place], April D, [place]

## 2018-04-28 NOTE — Telephone Encounter (Signed)
Called and spoke with patient regarding Spirivia Inhaler pt asst forms. [employer]ir[employer] faxed over approval from 04/27/18 thru 04/28/2019. Pt will have spiriva on Monday deliveried to home  Also patient rec'd in the mail Durlera form, that has to be mailed fax only for pt asst forms Pt will be coming by office on Monday to drop off for BQ signature Advised pt BQ will return to office on 05/02/18 to sign, and then it will be mailed back to [facility].

## 2018-04-28 NOTE — Telephone Encounter (Signed)
[name] said that she did receive the    fax and pt will get the medication on Monday [phone]

## 2018-05-01 MED ORDER — MOMETASONE FURO-FORMOTEROL FUM 100-5 MCG/ACT IN AERO: 2.0000 | INHALATION_SPRAY | Freq: Two times a day (BID) | RESPIRATORY_TRACT | 0 refills | Status: DC

## 2018-05-01 NOTE — Addendum Note (Signed)
Addended by: Georjean Mode on: 05/01/2018 04:42 PM   Modules accepted: Orders

## 2018-05-01 NOTE — Telephone Encounter (Signed)
Patient came in office today to drop off [name] pt asst forms for [facility] Provided pt with (2) durlera 100 samples Placed paperwork in [place] for BQ to review.  Routed message to Burman Nieves for [employer]

## 2018-05-02 NOTE — Telephone Encounter (Signed)
Paperwork signed by Dr. Lake Bells. Placed in envelope provided by patient and placed in outgoing mail. Called and updated patient. Nothing further needed.

## 2018-05-08 ENCOUNTER — Telehealth: Payer: Self-pay

## 2018-05-08 NOTE — Telephone Encounter (Signed)
Spoke to pt. She was confused as to why she needs to come in as she spoke to Dr. Burr Medico about this at her last visit. Pt then stated she needs to come in as her BP has been running high. Dr. Burr Medico doesn't have any appointments until 06/08/2018. I made an appt for pt to see Dr. Sandi Carne due to concerns about pt's BP. Ottis Stain, CMA

## 2018-05-08 NOTE — Telephone Encounter (Signed)
-----   Message from Everrett Coombe, MD sent at 05/02/2018  8:48 AM EDT ----- Patient has made multiple requests for pain medication refill. Can we get her to come in for an appointment to discuss this?  Thank you

## 2018-05-22 ENCOUNTER — Telehealth: Payer: Self-pay

## 2018-05-22 NOTE — Telephone Encounter (Signed)
Pt calling again to discuss her bp medication. It is not working for her. Please call pt back.

## 2018-05-22 NOTE — Telephone Encounter (Signed)
Pt called nurse line, states the losartan is not helping. BP has not changed any since she started taking it. Would like a call back from MD [phone] Wallace Cullens, RN

## 2018-05-23 ENCOUNTER — Telehealth: Payer: Self-pay | Admitting: Student in an Organized Health Care Education/Training Program

## 2018-05-23 NOTE — Telephone Encounter (Signed)
Tried calling patient to set up her AWV. No answer if patient calls back please help her make a AWV. jw

## 2018-05-23 NOTE — Telephone Encounter (Signed)
Appointment scheduled for BP follow Up/azr and told her to bring her home BP cuff at this visit. Katharina Caper, April D, [place]

## 2018-05-23 NOTE — Telephone Encounter (Signed)
As discussed at her last office note, patient needs a follow up visit for continued blood pressure management. Please call and ask her to make an office visit, and she should bring her home blood pressure cuff with to the visit.

## 2018-06-08 ENCOUNTER — Ambulatory Visit: Payer: Medicare Other | Admitting: Student in an Organized Health Care Education/Training Program

## 2018-07-05 ENCOUNTER — Ambulatory Visit: Payer: Medicare Other | Admitting: Student in an Organized Health Care Education/Training Program

## 2018-08-02 ENCOUNTER — Other Ambulatory Visit: Payer: Self-pay

## 2019-01-25 ENCOUNTER — Encounter: Payer: Self-pay | Admitting: Student in an Organized Health Care Education/Training Program

## 2019-01-25 ENCOUNTER — Ambulatory Visit (INDEPENDENT_AMBULATORY_CARE_PROVIDER_SITE_OTHER): Payer: Medicare Other | Admitting: Student in an Organized Health Care Education/Training Program

## 2019-01-25 ENCOUNTER — Other Ambulatory Visit: Payer: Self-pay

## 2019-01-25 VITALS — BP 135/78 | HR 85 | Wt 223.4 lb

## 2019-01-25 DIAGNOSIS — Z0000 Encounter for general adult medical examination without abnormal findings: Secondary | ICD-10-CM

## 2019-01-25 NOTE — Progress Notes (Signed)
Subjective:   [name] is a 68 y.o. female who presents for Medicare Annual (Subsequent) preventive examination.  The patient consented to a virtual visit.  Review of Systems:  No current complaints.       Objective:     Vitals: There were no vitals taken for this visit.  There is no height or weight on file to calculate BMI.  Advanced Directives 11/25/2016 10/29/2016 09/24/2016 12/29/2015 09/10/2015 09/02/2015 07/19/2015  Does Patient Have a Medical Advance Directive? No Yes No Yes Yes Yes No  Type of Advance Directive - [place];Living will - - Living will [place];Living will -  Copy of [place] in Chart? - No - copy requested - No - copy requested No - copy requested - -  Would patient like information on creating a medical advance directive? No - Patient declined No - Patient declined Yes (Inpatient - patient defers creating a medical advance directive at this time) - - - No - patient declined information    Tobacco Social History   Tobacco Use  Smoking Status Current Every Day Smoker  . Packs/day: 1.50  . Years: 40.00  . Pack years: 60.00  . Types: Cigarettes  . Last attempt to quit: 12/15/2013  . Years since quitting: 5.1  Smokeless Tobacco Never Used  Tobacco Comment   started smoking again 09/2016 smoking 3-4cigs per day     Ready to quit: Not Answered Counseling given: Not Answered Comment: started smoking again 09/2016 smoking 3-4cigs per day   Clinical Intake:                       Past Medical History:  Diagnosis Date  . Abnormal weight gain 08/14/2009   Qualifier: Diagnosis of  By: Lindell Noe MD, Jeneen Rinks    . Acute respiratory failure ([place])   . Allergic rhinitis   . Anemia   . Breast cancer ([place] 1997   s/p radiation and lumpectomy.  . CAP (community acquired pneumonia) 12/2013  . Chronic lower back pain   . COPD (chronic obstructive pulmonary disease) ([place])   . DVT (deep venous  thrombosis) ([place]) ~ 2004   LLE  . Edema extremities    lower  . Fatigue   . Hypertension    "only when I get stressed" (09/24/2016)  . Hypoalbuminemia   . Hypocalcemia   . Macular degeneration   . Osteoarthritis   . Psoriasis   . Tobacco abuse    Past Surgical History:  Procedure Laterality Date  . APPENDECTOMY    . BREAST LUMPECTOMY Left 1997  . JOINT REPLACEMENT    . KNEE ARTHROSCOPY Left    X2 ("before the replacement)  . ROUX-EN-Y GASTRIC BYPASS  May 2004  . TONSILLECTOMY    . TOTAL KNEE ARTHROPLASTY Left 1995  . TUBAL LIGATION     Family History  Problem Relation Age of Onset  . Osteoarthritis Mother   . Hypertension Mother   . Thyroid disease Mother   . Heart disease Father   . Cancer Father 25       prostate ca  . Diabetes Daughter    Social History   Socioeconomic History  . Marital status: Divorced    Spouse name: Not on file  . Number of children: Not on file  . Years of education: Not on file  . Highest education level: Not on file  Occupational History  . Not on file  Social Needs  .  Financial resource strain: Not on file  . Food insecurity:    Worry: Not on file    Inability: Not on file  . Transportation needs:    Medical: Not on file    Non-medical: Not on file  Tobacco Use  . Smoking status: Current Every Day Smoker    Packs/day: 1.50    Years: 40.00    Pack years: 60.00    Types: Cigarettes    Last attempt to quit: 12/15/2013    Years since quitting: 5.1  . Smokeless tobacco: Never Used  . Tobacco comment: started smoking again 09/2016 smoking 3-4cigs per day  Substance and Sexual Activity  . Alcohol use: Yes    Alcohol/week: 0.0 standard drinks    Comment: 09/24/2016 "drank back in the day; now only on special occasions"  . Drug use: No  . Sexual activity: Never  Lifestyle  . Physical activity:    Days per week: Not on file    Minutes per session: Not on file  . Stress: Not on file  Relationships  . Social connections:    Talks  on phone: Not on file    Gets together: Not on file    Attends religious service: Not on file    Active member of club or organization: Not on file    Attends meetings of clubs or organizations: Not on file    Relationship status: Not on file  Other Topics Concern  . Not on file  Social History Narrative  . Not on file    Outpatient Encounter Medications as of 01/25/2019  Medication Sig  . albuterol (ACCUNEB) 0.63 MG/3ML nebulizer solution Take 3 mLs (0.63 mg total) by nebulization every 6 (six) hours as needed for wheezing.  . Ascorbic Acid (VITAMIN C) 1000 MG tablet Take 1,000 mg by mouth 3 (three) times daily as needed.  Marland Kitchen aspirin 81 MG chewable tablet Chew 1 tablet (81 mg total) by mouth daily.  . Fluticasone-Umeclidin-Vilant (TRELEGY ELLIPTA) 100-62.5-25 MCG/INH AEPB Inhale 1 puff into the lungs daily.  . furosemide (LASIX) 20 MG tablet Take 1 tablet (20 mg total) by mouth daily.  Marland Kitchen losartan (COZAAR) 50 MG tablet Take 1 tablet (50 mg total) by mouth daily.  . mometasone-formoterol (DULERA) 100-5 MCG/ACT AERO Inhale 2 puffs into the lungs 2 (two) times daily.  . Multiple Vitamin (MULTIVITAMIN WITH MINERALS) TABS tablet Take 1 tablet by mouth daily.  Marland Kitchen oxyCODONE (ROXICODONE) 5 MG immediate release tablet Take 1 tablet (5 mg total) by mouth every 8 (eight) hours as needed for breakthrough pain.  . predniSONE (DELTASONE) 10 MG tablet 4 tabs for 2 days, then 3 tabs for 2 days, 2 tabs for 2 days, then 1 tab for 2 days, then stop   No facility-administered encounter medications on file as of 01/25/2019.     Activities of Daily Living No flowsheet data found.  Patient Care Team: Everrett Coombe, MD as PCP - General (Family Medicine)    Assessment:   This is a routine wellness examination for [name].  Exercise Activities and Dietary recommendations    Goals   None     Fall Risk Fall Risk  08/02/2018 10/29/2016 08/20/2016 07/28/2015 12/31/2011  Falls in the past year? 0 No No No -   Comment Emmi Telephone Survey: data to providers prior to load - Emmi Telephone Survey: data to providers prior to load - -  Risk for fall due to : - - - - History of fall(s)   Is the  patient's home free of loose throw rugs in walkways, pet beds, electrical cords, etc?  yes      Grab bars in the bathroom? no      Handrails on the stairs?   no      Adequate lighting?   yes  Patient rating of health (0-10) scale:    Depression Screen PHQ 2/9 Scores 04/24/2018 10/29/2016 12/29/2015 09/10/2015  PHQ - 2 Score 0 0 1 0     Cognitive Function   Montreal Cognitive Assessment  10/06/2016  Visuospatial/ Executive (0/5) 4  Naming (0/3) 3  Attention: Read list of digits (0/2) 2  Attention: Read list of letters (0/1) 1  Attention: Serial 7 subtraction starting at 100 (0/3) 3  Language: Repeat phrase (0/2) 2  Language : Fluency (0/1) 1  Abstraction (0/2) 2  Delayed Recall (0/5) 5  Orientation (0/6) 6  Total 29  Adjusted Score (based on education) 29      Immunization History  Administered Date(s) Administered  . Pneumococcal Polysaccharide-23 12/25/2013    Qualifies for Shingles Vaccine? Yes  Screening Tests Health Maintenance  Topic Date Due  . TETANUS/TDAP  09/23/1967  . COLONOSCOPY  09/22/1998  . PNA vac Low Risk Adult (2 of 2 - PCV13) 12/26/2014  . MAMMOGRAM  03/30/2015  . INFLUENZA VACCINE  04/14/2019  . DEXA SCAN  Completed  . Hepatitis C Screening  Completed    Cancer Screenings: Lung: Low Dose CT Chest recommended if Age 64-80 years, 30 pack-year currently smoking OR have quit w/in 15years. Patient does qualify. Breast:  Up to date on Mammogram? No   Up to date of Bone Density/Dexa? Yes Colorectal: would like to do the cologuard at her next office visit   Additional Screenings:      Plan:    Patient is due for mammogram and colon cancer screening. She elects to do cologuard which can be done at her next OV. She would benefit from a low dose CT scan of the lungs for  lung cancer screening. She reports that she has had the pneumovax vaccine with her pulmonary doctor.   We will send a mammogram form to the patient's home with her care plan.  I have personally reviewed and noted the following in the patient's chart:   . Medical and social history . Use of alcohol, tobacco or illicit drugs  . Current medications and supplements . Functional ability and status . Nutritional status . Physical activity . Advanced directives . List of other physicians . Hospitalizations, surgeries, and ER visits in previous 12 months . Vitals . Screenings to include cognitive, depression, and falls . Referrals and appointments  In addition, I have reviewed and discussed with patient certain preventive protocols, quality metrics, and best practice recommendations. A written personalized care plan for preventive services as well as general preventive health recommendations were provided to patient.    This visit was conducted virtually in the setting of the [place] pandemic.    Everrett Coombe, MD  01/25/2019

## 2019-01-25 NOTE — Patient Instructions (Addendum)
You spoke to Dr. Burr Medico over the phone for your annual wellness visit.  We discussed goals, including: - You would like to work on weight loss. Specific goals include limiting carbohydrates to one serving per meal. You mentioned attempting to lose 2-3 lbs per week.  - You mentioned restarting silver sneakers and swimming once the gym reopens  Your health maintenance was discussed. Please call our office and schedule a visit. As discussed, you are due for: - colon cancer screening. We can give you a card to screen for colon cancer at your next visit. - I placed a mammogram card in with your packet. You may call to schedule an appointment.  You mentioned that you would like to discuss blood pressure and back pain. Please call and schedule an office visit so we may address these concerns in person.  Our clinic's number is [phone]. Please call with questions or concerns about what we discussed today.

## 2019-01-29 ENCOUNTER — Ambulatory Visit: Payer: Medicare Other | Admitting: Adult Health

## 2019-03-08 ENCOUNTER — Ambulatory Visit: Payer: Medicare Other | Admitting: Adult Health

## 2019-04-26 ENCOUNTER — Other Ambulatory Visit: Payer: Self-pay

## 2019-04-26 ENCOUNTER — Ambulatory Visit (INDEPENDENT_AMBULATORY_CARE_PROVIDER_SITE_OTHER): Payer: Medicare Other | Admitting: Adult Health

## 2019-04-26 ENCOUNTER — Encounter: Payer: Self-pay | Admitting: Adult Health

## 2019-04-26 DIAGNOSIS — J441 Chronic obstructive pulmonary disease with (acute) exacerbation: Secondary | ICD-10-CM | POA: Diagnosis not present

## 2019-04-26 DIAGNOSIS — F17200 Nicotine dependence, unspecified, uncomplicated: Secondary | ICD-10-CM

## 2019-04-26 DIAGNOSIS — J9611 Chronic respiratory failure with hypoxia: Secondary | ICD-10-CM | POA: Diagnosis not present

## 2019-04-26 NOTE — Patient Instructions (Addendum)
Continue on Dulera 2 puffs Twice daily  , rinse after use  Continue on Spiriva  daily  Please wear Oxygen 2l/m At bedtime  , goal is for O2 sats >90%. Can use oxygen with activity at 2l/m .  Chest xray today .  Please work on stopping smoking .  Saline nasal rinses As needed   Claritin 10mg  At bedtime  As needed  Drainage  Follow up with Dr. Lake Bells in 6 months and As needed   Please contact office for sooner follow up if symptoms do not improve or worsen or seek emergency care

## 2019-04-26 NOTE — Progress Notes (Signed)
@Patient  ID: [name], female    DOB: 02-28-1951, 68 y.o.   MRN: [id number]  Chief Complaint  Patient presents with  . Follow-up    COPD     Referring provider: Everrett Coombe, MD  HPI: 68  yo female smoker followed for COPD with asthma component , chronic respiratory failure   TEST  12/2013 PFT>Ratio 46%, FEV1 1.24L (54% pred, 44% change), TLC 6.17L (127% pred), DLCO 10.82 (48% pred),   Chest imaging: November 2016 CT chest images personally reviewed showing diffuse centrilobular emphysema Echo 07/2015 EF 55-60%, gr 1 DD . PAP 65 , LA mod dila./Mild RA dilation  Sleep study 11/2016 [facility] 1.4/hr , moderate desats -low 81%. -recs O2 At bedtime  .   04/26/2019 Follow up : COPD ,Smoker , O2 RF  Patient returns for a follow-up.  Last seen in the office April 2019.  Patient has underlying severe COPD.  With an asthmatic component.  She says overall her breathing has been doing okay.  She denies any flare of cough or wheezing.  She says she does get winded with activity.  But says she is somewhat sedentary. Remains on Dulera and Spiriva .-Part of patient assistance .  Patient assistance paperwork was completed and updated today.  Has oxygen at home , uses with activity intermittently . Feels that breathing has been doing well. Checks oxygen at home on room air 90-97%.  She says with heavy activity it does drop down 80s.  We discussed using oxygen with her activity.  Patient education on potential complications of hypoxemia.  Previous sleep study showed no significant sleep apnea however did show nocturnal hypoxemia.  We discussed using oxygen at bedtime.  Still smoking , discussed smoking cessation .   Declines LDCT chest .      No Known Allergies  Immunization History  Administered Date(s) Administered  . Pneumococcal Polysaccharide-23 12/25/2013    Past Medical History:  Diagnosis Date  . Abnormal weight gain 08/14/2009   Qualifier: Diagnosis of  By: Lindell Noe MD, Jeneen Rinks     . Acute respiratory failure ([place])   . Allergic rhinitis   . Anemia   . Breast cancer ([place]) 1997   s/p radiation and lumpectomy.  . CAP (community acquired pneumonia) 12/2013  . Chronic lower back pain   . COPD (chronic obstructive pulmonary disease) ([place])   . DVT (deep venous thrombosis) ([place]) ~ 2004   LLE  . Edema extremities    lower  . Fatigue   . Hypertension    "only when I get stressed" (09/24/2016)  . Hypoalbuminemia   . Hypocalcemia   . Macular degeneration   . Osteoarthritis   . Psoriasis   . Tobacco abuse     Tobacco History: Social History   Tobacco Use  Smoking Status Current Every Day Smoker  . Packs/day: 0.25  . Years: 40.00  . Pack years: 10.00  . Types: Cigarettes  . Last attempt to quit: 12/15/2013  . Years since quitting: 5.3  Smokeless Tobacco Never Used  Tobacco Comment   started smoking again 09/2016 smoking 3-4cigs per day   Ready to quit: No Counseling given: Yes Comment: started smoking again 09/2016 smoking 3-4cigs per day   Outpatient Medications Prior to Visit  Medication Sig Dispense Refill  . albuterol (ACCUNEB) 0.63 MG/3ML nebulizer solution Take 3 mLs (0.63 mg total) by nebulization every 6 (six) hours as needed for wheezing. 75 mL 12  . Ascorbic Acid (VITAMIN C) 1000 MG tablet Take 1,000 mg  by mouth 3 (three) times daily as needed.    . furosemide (LASIX) 20 MG tablet Take 1 tablet (20 mg total) by mouth daily. 30 tablet 1  . losartan (COZAAR) 50 MG tablet Take 1 tablet (50 mg total) by mouth daily. 90 tablet 3  . mometasone-formoterol (DULERA) 100-5 MCG/ACT AERO Inhale 2 puffs into the lungs 2 (two) times daily. 1 Inhaler 0  . Multiple Vitamin (MULTIVITAMIN WITH MINERALS) TABS tablet Take 1 tablet by mouth daily.    Marland Kitchen oxyCODONE (ROXICODONE) 5 MG immediate release tablet Take 1 tablet (5 mg total) by mouth every 8 (eight) hours as needed for breakthrough pain. 15 tablet 0  . tiotropium (SPIRIVA) 18 MCG inhalation capsule Place 18 mcg  into inhaler and inhale daily.    Marland Kitchen aspirin 81 MG chewable tablet Chew 1 tablet (81 mg total) by mouth daily. (Patient not taking: Reported on 01/25/2019) 30 tablet 0  . Fluticasone-Umeclidin-Vilant (TRELEGY ELLIPTA) 100-62.5-25 MCG/INH AEPB Inhale 1 puff into the lungs daily. (Patient not taking: Reported on 01/25/2019) 1 each 5  . predniSONE (DELTASONE) 10 MG tablet 4 tabs for 2 days, then 3 tabs for 2 days, 2 tabs for 2 days, then 1 tab for 2 days, then stop (Patient not taking: Reported on 01/25/2019) 20 tablet 0   No facility-administered medications prior to visit.      Review of Systems:   Constitutional:   No  weight loss, night sweats,  Fevers, chills, + fatigue, or  lassitude.  HEENT:   No headaches,  Difficulty swallowing,  Tooth/dental problems, or  Sore throat,                No sneezing, itching, ear ache, nasal congestion, post nasal drip,   CV:  No chest pain,  Orthopnea, PND, swelling in lower extremities, anasarca, dizziness, palpitations, syncope.   GI  No heartburn, indigestion, abdominal pain, nausea, vomiting, diarrhea, change in bowel habits, loss of appetite, bloody stools.   Resp:  No chest wall deformity  Skin: no rash or lesions.  GU: no dysuria, change in color of urine, no urgency or frequency.  No flank pain, no hematuria   MS:  No joint pain or swelling.  No decreased range of motion.  No back pain.    Physical Exam  BP 120/72 (BP Location: Right Arm, Cuff Size: Normal)   Pulse 81   Temp 98.1 F (36.7 C) (Oral)   Ht 5\' 5"  (1.651 m)   Wt 225 lb (102.1 kg)   SpO2 91%   BMI 37.44 kg/m   GEN: A/Ox3; pleasant , NAD, obese    HEENT:  [place]/AT,   NOSE-clear, THROAT-clear, no lesions, no postnasal drip or exudate noted.   NECK:  Supple w/ fair ROM; no JVD; normal carotid impulses w/o bruits; no thyromegaly or nodules palpated; no lymphadenopathy.    RESP diminished breath sounds in the bases   no accessory muscle use, no dullness to percussion   CARD:  RRR, no m/r/g, no peripheral edema, pulses intact, no cyanosis or clubbing.  GI:   Soft & nt; nml bowel sounds; no organomegaly or masses detected.   Musco: Warm bil, no deformities or joint swelling noted.   Neuro: alert, no focal deficits noted.    Skin: Warm, no lesions or rashes    Lab Results:   BNP Imaging: No results found.    PFT Results Latest Ref Rng & Units 01/03/2014  FVC-Pre L 2.05  FVC-Predicted Pre % 69  FVC-Post  L 2.71  FVC-Predicted Post % 91  Pre FEV1/FVC % % 42  Post FEV1/FCV % % 46  FEV1-Pre L 0.85  FEV1-Predicted Pre % 37  FEV1-Post L 1.24  DLCO UNC% % 48  DLCO COR %Predicted % 53  TLC L 6.17  TLC % Predicted % 127  RV % Predicted % 195    No results found for: NITRICOXIDE      Assessment & Plan:   COPD with acute exacerbation (HCC) Compensated on present regimen.  Smoking cessation was discussed Declined low-dose CT screening program Check chest x-ray today  Plan  Patient Instructions  Continue on Dulera 2 puffs Twice daily  , rinse after use  Continue on Spiriva  daily  Please wear Oxygen 2l/m At bedtime  , goal is for O2 sats >90%.  Chest xray today .  Please work on stopping smoking .  Saline nasal rinses As needed   Claritin 10mg  At bedtime  As needed  Drainage  Follow up with Dr. Lake Bells in 6 months and As needed   Please contact office for sooner follow up if symptoms do not improve or worsen or seek emergency care       Chronic respiratory failure ([place]) Discussed using oxygen at bedtime and with activity O2 saturation goal was greater than 88 to 90%.  Tobacco use disorder Smoking cessation discussed     Rexene Edison, NP 04/26/2019

## 2019-04-26 NOTE — Assessment & Plan Note (Signed)
Discussed using oxygen at bedtime and with activity O2 saturation goal was greater than 88 to 90%.

## 2019-04-26 NOTE — Assessment & Plan Note (Signed)
Smoking cessation discussed 

## 2019-04-26 NOTE — Assessment & Plan Note (Signed)
Compensated on present regimen.  Smoking cessation was discussed Declined low-dose CT screening program Check chest x-ray today  Plan  Patient Instructions  Continue on Dulera 2 puffs Twice daily  , rinse after use  Continue on Spiriva  daily  Please wear Oxygen 2l/m At bedtime  , goal is for O2 sats >90%.  Chest xray today .  Please work on stopping smoking .  Saline nasal rinses As needed   Claritin 10mg  At bedtime  As needed  Drainage  Follow up with Dr. Lake Bells in 6 months and As needed   Please contact office for sooner follow up if symptoms do not improve or worsen or seek emergency care

## 2019-04-28 NOTE — Progress Notes (Signed)
Reviewed, agree 

## 2019-05-15 ENCOUNTER — Other Ambulatory Visit: Payer: Self-pay | Admitting: Student in an Organized Health Care Education/Training Program

## 2019-05-15 ENCOUNTER — Other Ambulatory Visit: Payer: Self-pay | Admitting: Family Medicine

## 2019-05-15 DIAGNOSIS — I10 Essential (primary) hypertension: Secondary | ICD-10-CM

## 2019-05-15 NOTE — Telephone Encounter (Signed)
Please call patient and schedule follow up appointment for HTN. They have requested refill but have not been seen recently for chronic problem follow up. Please let them know that I will not be filling this medication with further follow up.   [name], M.D.  PGY-2  Family Medicine  [phone] 05/15/2019 5:00 PM

## 2019-05-17 ENCOUNTER — Other Ambulatory Visit: Payer: Self-pay

## 2019-05-17 MED ORDER — FUROSEMIDE 20 MG PO TABS: 20.0000 mg | ORAL_TABLET | Freq: Every day | ORAL | 1 refills | Status: DC

## 2019-05-17 NOTE — Telephone Encounter (Signed)
I returned patients phone call from nurse line VM. Patient stated someone called her from this number to set up an apt. Patient didn't understand why she needed one. I informed her its been over a year since she was seen and patient stated, "I do not want to come in there, im tired of getting a new doctor all the time and the virus." I explained to patient why she has a new doctor and asked her if she has a BP cuff, she does. I scheduled her a virtual apt on 09/18 and she will take her BP while on the phone.

## 2019-05-24 ENCOUNTER — Telehealth: Payer: Self-pay | Admitting: Adult Health

## 2019-05-24 NOTE — Telephone Encounter (Signed)
Ov note from 04/26/2019 with TP was faxed to Kim's attn at [facility] to the number provided

## 2019-05-29 DIAGNOSIS — H2513 Age-related nuclear cataract, bilateral: Secondary | ICD-10-CM | POA: Diagnosis not present

## 2019-06-01 ENCOUNTER — Other Ambulatory Visit: Payer: Self-pay

## 2019-06-01 ENCOUNTER — Telehealth: Payer: Medicare Other | Admitting: Family Medicine

## 2019-06-13 ENCOUNTER — Encounter: Payer: Self-pay | Admitting: Family Medicine

## 2019-06-13 ENCOUNTER — Ambulatory Visit (INDEPENDENT_AMBULATORY_CARE_PROVIDER_SITE_OTHER): Payer: Medicare Other | Admitting: Family Medicine

## 2019-06-13 ENCOUNTER — Other Ambulatory Visit: Payer: Self-pay

## 2019-06-13 VITALS — BP 160/76 | HR 79 | Ht 65.0 in

## 2019-06-13 DIAGNOSIS — R0602 Shortness of breath: Secondary | ICD-10-CM

## 2019-06-13 DIAGNOSIS — Z79899 Other long term (current) drug therapy: Secondary | ICD-10-CM

## 2019-06-13 DIAGNOSIS — I5032 Chronic diastolic (congestive) heart failure: Secondary | ICD-10-CM | POA: Diagnosis not present

## 2019-06-13 DIAGNOSIS — F17200 Nicotine dependence, unspecified, uncomplicated: Secondary | ICD-10-CM | POA: Diagnosis not present

## 2019-06-13 DIAGNOSIS — Z1231 Encounter for screening mammogram for malignant neoplasm of breast: Secondary | ICD-10-CM

## 2019-06-13 DIAGNOSIS — Z0000 Encounter for general adult medical examination without abnormal findings: Secondary | ICD-10-CM

## 2019-06-13 DIAGNOSIS — Z1211 Encounter for screening for malignant neoplasm of colon: Secondary | ICD-10-CM | POA: Diagnosis not present

## 2019-06-13 MED ORDER — OXYCODONE HCL 5 MG PO TABS: 5.0000 mg | ORAL_TABLET | Freq: Three times a day (TID) | ORAL | 0 refills | Status: DC | PRN

## 2019-06-13 MED ORDER — OXYCODONE HCL 5 MG PO TABS
5.0000 mg | ORAL_TABLET | Freq: Three times a day (TID) | ORAL | 0 refills | Status: AC | PRN
Start: 2019-06-13 — End: ?

## 2019-06-13 NOTE — Patient Instructions (Signed)
Dear Gertha Calkin,   It was good to see you! Thank you for taking your time to come in to be seen. Today, we discussed the following:   Medication Refill  Shortness of breath  Increase Lasix to twice a day, at 9am and 2pm until the swelling in your legs improves.    Pain  Ibuprofen and tylenol are first line Take oxycodone only when really needed. We spoke about limiting refills of this medication. This medication can cause decrease in respiratory drive, please do not take it if you are already short of breath or having difficulty breathing   Please follow up in 2 weeks  or sooner for concerning or worsening symptoms.  Be well,   Zettie Cooley, M.D   [facility] [phone]  *Sign up for MyChart for instant access to your health profile, labs, orders, upcoming appointments or to contact your provider with questions*  ===================================================================================

## 2019-06-13 NOTE — Progress Notes (Addendum)
Subjective  CC: Medication Refill  [id number] Moralas is a 68 y.o. female who presents today with the following problems: SOB  Patient reports that this has been happening since she was told to start using oxygen at night. (Per chart review, was back in April). She reports that since she's been using it at night, she has had difficulty during the day and feels like she needs her O2. She has been using a "husband pillow" - that she has to use to sit up to fall sleep at night, but eventually does end up sleeping on her side. Also, reports that she didn't take lasix on Sunday and Monday. Patient currently continues to smoke and reports about 4-5 cigarettes a day.   Pertinent P/F/SHx: Diastolic HF ([id number]), HTN, PAH, chronic respiratory failure, COPD, OA, psoriasis, hx of breast cancer, morbid obesity, Tobacco abuse.   ROS: Pertinent ROS included in HPI. Objective  Physical Exam:  BP (!) 160/76   Pulse 79   Ht 5\' 5"  (1.651 m)   SpO2 (!) 84%   BMI 37.44 kg/m  General: obese white female sitting in wheelchair (provided by [facility] to get to exam room) and cane. Mask on face. Has to pull off at points due to difficulty breathing.  Chest: 3/6 systolic ejection murmur, harshest at mid LSB.  Neck: JVD 1 cm while erect. Unable to recline patient due to body habitus and unable to move to exam table.  Lungs: CTAB. No wheezing appreciated. Crackles on lower bases posteriorly. Mild accessory neck use with inhalation.  Abdomen: obese, + BS, NTND Lower extremities: no significant temperature difference, but does appear a little more pink than upper legs. 3+ pitting to her knees bilaterally. Areas of weeping fluid   Pertinent Labs:  Reviewed  Assessment & Plan    Problem List Items Addressed This Visit      Cardiovascular and Mediastinum   Diastolic congestive heart failure (HCC) (Chronic)     Other   Shortness of breath    Shortness of breath likely multifactorial in the setting of pulmonary artery  hypertension, morbid obesity, COPD, continued tobacco use.  Patient missed Lasix on Tuesday and Wednesday, which is likely the reason she is having bilateral lower extremity edema with pitting.  Will increase Lasix to 20 mg twice daily until patient has improved lower extremity edema and then go back to grams daily.  Will not increase dosing of Lasix as she is responding to it well Patient reports that she would like to stop using her O2 at night, as she doesn't know why she was asked to start this several months ago. If no improvement with lasix, should evaluate further for worsening HF (last EF in 2018), repeat cath. Patient counseled on tobacco use and is not interested in quitting at this time.   Patient noted to be 84% O2 with initial vitals. Patient appeared to be stable through entire exam. I called her when she left the office and she was at home taking her own vitals. She placed pulse -ox at home and remained at baseline of 91%. She reports that she was low in the office probably because of all the moving around to get to the appt, and that the mask makes it difficult for her to breath. Patient given return precautions and indications to go to the ED.       Tobacco use disorder    Other Visit Diagnoses    On angiotensin receptor blockers (ARB)    -  Primary  Relevant Orders   Comprehensive metabolic panel   Encounter for screening colonoscopy       Healthcare maintenance       Relevant Orders   Ambulatory referral to Gastroenterology   Screening mammogram, encounter for       Relevant Orders   MM Digital Screening     Wilber Oliphant, M.D.  PGY-2  Family Medicine  [id number] 06/15/2019 1:15 PM

## 2019-06-15 NOTE — Assessment & Plan Note (Addendum)
Shortness of breath likely multifactorial in the setting of pulmonary artery hypertension, morbid obesity, COPD, continued tobacco use.  Patient missed Lasix on Tuesday and Wednesday, which is likely the reason she is having bilateral lower extremity edema with pitting.  Will increase Lasix to 20 mg twice daily until patient has improved lower extremity edema and then go back to grams daily.  Will not increase dosing of Lasix as she is responding to it well Patient reports that she would like to stop using her O2 at night, as she doesn't know why she was asked to start this several months ago. If no improvement with lasix, should evaluate further for worsening HF (last EF in 2018), repeat cath. Patient counseled on tobacco use and is not interested in quitting at this time.   Patient noted to be 84% O2 with initial vitals. Patient appeared to be stable through entire exam. I called her when she left the office and she was at home taking her own vitals. She placed pulse -ox at home and remained at baseline of 91%. She reports that she was low in the office probably because of all the moving around to get to the appt, and that the mask makes it difficult for her to breath. Patient given return precautions and indications to go to the ED.

## 2019-07-06 DIAGNOSIS — H2513 Age-related nuclear cataract, bilateral: Secondary | ICD-10-CM | POA: Diagnosis not present

## 2019-07-06 DIAGNOSIS — H25013 Cortical age-related cataract, bilateral: Secondary | ICD-10-CM | POA: Diagnosis not present

## 2019-07-06 DIAGNOSIS — H40013 Open angle with borderline findings, low risk, bilateral: Secondary | ICD-10-CM | POA: Diagnosis not present

## 2019-07-06 DIAGNOSIS — H2512 Age-related nuclear cataract, left eye: Secondary | ICD-10-CM | POA: Diagnosis not present

## 2019-07-06 DIAGNOSIS — H25043 Posterior subcapsular polar age-related cataract, bilateral: Secondary | ICD-10-CM | POA: Diagnosis not present

## 2019-08-02 DIAGNOSIS — H2512 Age-related nuclear cataract, left eye: Secondary | ICD-10-CM | POA: Diagnosis not present

## 2019-08-02 DIAGNOSIS — H2513 Age-related nuclear cataract, bilateral: Secondary | ICD-10-CM | POA: Diagnosis not present

## 2019-08-03 DIAGNOSIS — H2511 Age-related nuclear cataract, right eye: Secondary | ICD-10-CM | POA: Diagnosis not present

## 2019-08-04 ENCOUNTER — Other Ambulatory Visit: Payer: Self-pay | Admitting: Family Medicine

## 2019-08-04 DIAGNOSIS — I10 Essential (primary) hypertension: Secondary | ICD-10-CM

## 2019-08-06 ENCOUNTER — Ambulatory Visit: Payer: Medicare Other | Admitting: Adult Health

## 2019-08-06 ENCOUNTER — Telehealth: Payer: Self-pay | Admitting: Family Medicine

## 2019-08-06 DIAGNOSIS — I5032 Chronic diastolic (congestive) heart failure: Secondary | ICD-10-CM

## 2019-08-06 NOTE — Telephone Encounter (Signed)
Called patient to check in on improvement in shortness of breath since last visit on 06/13/2019.  She reports that she continues to have shortness of breath and is on 2 L.  She continues to report that the more she is on oxygen, the more she feels she needs to use it.  She has not been taking Lasix twice daily as previously discussed due to running out of the medication.  She is also limited on current travel as she has had cataract surgery in the last couple of days and cannot drive. She has limited resources for people to drive her to appointments.   She is also not noted any improvement in swelling in her legs.  She denies any chest pain.  Multifactorial given comorbidities. Will try to diurese further to see if it helps with SOB. She cancelled her appt with pulmonology today and I encouraged her to follow up with them ASAP given continued SOB.  Given difficulty of in person evaluation, plans for the following.  -Start 20 mg Lasix twice daily -Daily weights every morning prior to eating or drinking -Order for echocardiogram for reevaluation of heart function and pulmonary hypertension. -We will follow-up with patient in 2 to 3 days over the phone to monitor diuresis  -Return precautions provided - ED if any chest pain, worsened SOB. Call if decreased urination   Wilber Oliphant, M.D.  12:22 PM 08/06/2019

## 2019-08-07 NOTE — Telephone Encounter (Signed)
Pt is scheduled for this on 08/17/2019 at 1:00pm according to her future appointments.[name], CMA

## 2019-08-13 ENCOUNTER — Encounter: Payer: Self-pay | Admitting: Adult Health

## 2019-08-13 ENCOUNTER — Ambulatory Visit (INDEPENDENT_AMBULATORY_CARE_PROVIDER_SITE_OTHER): Payer: Medicare Other | Admitting: Adult Health

## 2019-08-13 ENCOUNTER — Other Ambulatory Visit: Payer: Self-pay

## 2019-08-13 DIAGNOSIS — J9611 Chronic respiratory failure with hypoxia: Secondary | ICD-10-CM

## 2019-08-13 DIAGNOSIS — J449 Chronic obstructive pulmonary disease, unspecified: Secondary | ICD-10-CM | POA: Diagnosis not present

## 2019-08-13 NOTE — Patient Instructions (Addendum)
Continue on Dulera 2 puffs Twice daily  , rinse after use  Continue on Spiriva  daily  Please wear Oxygen 2l/m At bedtime and with activity   , goal is for O2 sats >88-90%. Order for POC  .  Great job not smoking  .  Saline nasal rinses As needed   Claritin 10mg  At bedtime  As needed  Drainage  Consider Flu shot .  Follow up with Dr. Valeta Harms in 6 months and As needed   Please contact office for sooner follow up if symptoms do not improve or worsen or seek emergency care

## 2019-08-13 NOTE — Progress Notes (Signed)
.  Virtual Visit via Telephone Note  I connected with Gertha Calkin on 08/13/19 at  2:30 PM EST by telephone and verified that I am speaking with the correct person using two identifiers.  Location: Patient: Home  Provider: Office    I discussed the limitations, risks, security and privacy concerns of performing an evaluation and management service by telephone and the availability of in person appointments. I also discussed with the patient that there may be a patient responsible charge related to this service. The patient expressed understanding and agreed to proceed.   History of Present Illness: 68 year old female smoker followed for COPD with an asthmatic component and chronic respiratory failure oxygen dependent.  Today's televisit is a 37-month follow-up for COPD and oxygen dependent respiratory failure.  Patient has underlying severe COPD with an asthmatic component.  She says overall breathing is doing about the same.  She gets short of breath with heavy activity.  She says she is sedentary.  Not very active at home.  Says she has not smoked in the last 2 weeks.  She was congratulated on her smoking cessation.  Patient remains on Dulera and Spiriva.  She does get this on the patient assistance platform.  She remains on oxygen at home.  She says she uses it with activity and at bedtime she does request a portable oxygen concentrator. Declines flu shot  She denies any chest pain orthopnea PND or increased leg swelling.  Observations/Objective: 12/2013 PFT>Ratio 46%, FEV1 1.24L (54% pred, 44% change), TLC 6.17L (127% pred), DLCO 10.82 (48% pred),   Chest imaging: November 2016 CT chest images personally reviewed showing diffuse centrilobular emphysema Echo 07/2015 EF 55-60%, gr 1 DD . PAP 65 , LA mod dila./Mild RA dilation  Sleep study 11/2016 [facility] 1.4/hr , moderate desats -low 81%. -recs O2 At bedtime  .   2D echo Systolic pressure was severely increased. PA   peak pressure: 54  mm Hg (S).   Assessment and Plan: COPD-appears stable.  Chronic respiratory failure oxygen dependent.  Continue on oxygen with activity and at bedtime.  O2 saturation goal greater than 88 to 90%.  Order for POC sent to DME.  Plan  Patient Instructions  Continue on Dulera 2 puffs Twice daily  , rinse after use  Continue on Spiriva  daily  Please wear Oxygen 2l/m At bedtime and with activity   , goal is for O2 sats >88-90%. Order for POC  .  Great job not smoking  .  Saline nasal rinses As needed   Claritin 10mg  At bedtime  As needed  Drainage  Consider Flu shot .  Follow up with Dr. Valeta Harms in 6 months and As needed   Please contact office for sooner follow up if symptoms do not improve or worsen or seek emergency care       Follow Up Instructions:   Follow-up in 6 months and as needed  I discussed the assessment and treatment plan with the patient. The patient was provided an opportunity to ask questions and all were answered. The patient agreed with the plan and demonstrated an understanding of the instructions.   The patient was advised to call back or seek an in-person evaluation if the symptoms worsen or if the condition fails to improve as anticipated.  I provided 22 minutes of non-face-to-face time during this encounter.   Rexene Edison, NP

## 2019-08-14 NOTE — Addendum Note (Signed)
Addended by: Parke Poisson E on: 08/14/2019 02:31 PM   Modules accepted: Orders

## 2019-08-17 ENCOUNTER — Ambulatory Visit (HOSPITAL_COMMUNITY)
Admission: RE | Admit: 2019-08-17 | Discharge: 2019-08-17 | Disposition: A | Discharge status: Home / Self Care | Payer: Medicare Other | Source: Ambulatory Visit | Attending: Family Medicine | Admitting: Family Medicine

## 2019-08-17 ENCOUNTER — Other Ambulatory Visit: Payer: Self-pay

## 2019-08-17 DIAGNOSIS — J449 Chronic obstructive pulmonary disease, unspecified: Secondary | ICD-10-CM | POA: Diagnosis not present

## 2019-08-17 DIAGNOSIS — Z853 Personal history of malignant neoplasm of breast: Secondary | ICD-10-CM | POA: Diagnosis not present

## 2019-08-17 DIAGNOSIS — I110 Hypertensive heart disease with heart failure: Secondary | ICD-10-CM | POA: Diagnosis not present

## 2019-08-17 DIAGNOSIS — I358 Other nonrheumatic aortic valve disorders: Secondary | ICD-10-CM | POA: Insufficient documentation

## 2019-08-17 DIAGNOSIS — I5032 Chronic diastolic (congestive) heart failure: Secondary | ICD-10-CM | POA: Diagnosis not present

## 2019-08-17 NOTE — Progress Notes (Signed)
  Echocardiogram 2D Echocardiogram has been performed.  [name] [name] 08/17/2019, 2:02 PM

## 2019-08-21 ENCOUNTER — Telehealth: Payer: Self-pay | Admitting: Family Medicine

## 2019-08-21 NOTE — Telephone Encounter (Signed)
Call patient. No answer, LVM about echocardiogram results being in. No significant change from previous echo in 2018. Patient encouraged to call back if she has further questions.   Wilber Oliphant, M.D.  9:24 AM 08/21/2019

## 2019-08-23 DIAGNOSIS — H2511 Age-related nuclear cataract, right eye: Secondary | ICD-10-CM | POA: Diagnosis not present

## 2019-08-28 ENCOUNTER — Other Ambulatory Visit: Payer: Self-pay | Admitting: Family Medicine

## 2019-08-28 DIAGNOSIS — I10 Essential (primary) hypertension: Secondary | ICD-10-CM

## 2019-10-18 ENCOUNTER — Ambulatory Visit: Payer: Medicare Other | Admitting: Adult Health

## 2019-10-18 ENCOUNTER — Ambulatory Visit: Payer: Medicare Other

## 2019-10-23 ENCOUNTER — Ambulatory Visit (INDEPENDENT_AMBULATORY_CARE_PROVIDER_SITE_OTHER): Payer: Medicare Other

## 2019-10-23 ENCOUNTER — Ambulatory Visit: Payer: Medicare Other

## 2019-10-23 ENCOUNTER — Other Ambulatory Visit: Payer: Self-pay

## 2019-10-23 ENCOUNTER — Ambulatory Visit (INDEPENDENT_AMBULATORY_CARE_PROVIDER_SITE_OTHER): Payer: Medicare Other | Admitting: Adult Health

## 2019-10-23 ENCOUNTER — Encounter: Payer: Self-pay | Admitting: Adult Health

## 2019-10-23 VITALS — BP 132/84 | HR 79 | Temp 97.1°F | Ht 65.0 in | Wt 209.0 lb

## 2019-10-23 DIAGNOSIS — J449 Chronic obstructive pulmonary disease, unspecified: Secondary | ICD-10-CM

## 2019-10-23 DIAGNOSIS — J9611 Chronic respiratory failure with hypoxia: Secondary | ICD-10-CM

## 2019-10-23 DIAGNOSIS — J441 Chronic obstructive pulmonary disease with (acute) exacerbation: Secondary | ICD-10-CM

## 2019-10-23 DIAGNOSIS — J439 Emphysema, unspecified: Secondary | ICD-10-CM | POA: Diagnosis not present

## 2019-10-23 NOTE — Assessment & Plan Note (Deleted)
COPD currently stable.  Check chest x-ray today Smoking cessation Plan  Patient Instructions  Continue on Dulera 2 puffs Twice daily  , rinse after use  Continue on Spiriva  daily  Please wear Oxygen 2l/m At bedtime and with activity   , goal is for O2 sats >88-90%. Order for POC  .  Saline nasal rinses As needed   Claritin 10mg  At bedtime  As needed  Drainage  Chest xray today .  Follow up with Dr. Valeta Harms in 4 months and As needed   Please contact office for sooner follow up if symptoms do not improve or worsen or seek emergency care

## 2019-10-23 NOTE — Assessment & Plan Note (Signed)
Continue on oxygen with activity and at bedtime order for POC to help with mobility  Plan  Patient Instructions  Continue on Dulera 2 puffs Twice daily  , rinse after use  Continue on Spiriva  daily  Please wear Oxygen 2l/m At bedtime and with activity   , goal is for O2 sats >88-90%. Order for POC  .  Saline nasal rinses As needed   Claritin 10mg  At bedtime  As needed  Drainage  Chest xray today .  Follow up with Dr. Valeta Harms in 4 months and As needed   Please contact office for sooner follow up if symptoms do not improve or worsen or seek emergency care

## 2019-10-23 NOTE — Progress Notes (Addendum)
@Patient  ID: [name], female    DOB: 01/21/1951, 69 y.o.   MRN: [id number]  Chief Complaint  Patient presents with  . Follow-up    COPD     Referring provider: Wilber Oliphant, MD  HPI: 69 year old female active smoker followed for COPD with an asthmatic component and chronic oxygen dependent respiratory failure  TEST/EVENTS :  12/2013 PFT>Ratio 46%, FEV1 1.24L (54% pred, 44%change), TLC 6.17L (127% pred), DLCO 10.82 (48% pred),   Chest imaging:  November 2016 CT chest images personally reviewed showing diffuse centrilobular emphysema Echo 07/2015 EF 55-60%, gr 1 DD . PAP 65 , LA mod dila./Mild RA dilation  Sleep study 11/2016 [facility] 1.4/hr , moderate desats -low 81%. -recs O2At bedtime.  2D echo Systolic pressure was severely increased. PA peak pressure: 54 mm Hg (S).  Declines flu shots   10/23/2019 Follow up : COPD , O2 RF  Patient returns for a 76-month follow-up.  Patient has underlying severe COPD with an asthmatic component overall says that her breathing is doing about the same.  She gets short of breath with heavy activity.  She says she is somewhat sedentary.  She is trying to cut back on her smoking.  She remains on Dulera and Spiriva.  She does obtain these through patient assistance. Needs Spiriva papers done today.   Patient has chronic oxygen dependent respiratory failure.  She is on oxygen with activity and at bedtime.  She does request a portable concentrator to help her be more mobile.  Today's walk test in the office showed O2 saturations at 84% walking on room air.  Patient required 4 L of O2 on portable oxygen concentrator/pulsing oxygen.  Working on weight loss. Down 15lbs over last 6 months .    No Known Allergies  Immunization History  Administered Date(s) Administered  . Pneumococcal Polysaccharide-23 12/25/2013    Past Medical History:  Diagnosis Date  . Abnormal weight gain 08/14/2009   Qualifier: Diagnosis of  By: Lindell Noe MD, Jeneen Rinks     . Acute respiratory failure ([place])   . Allergic rhinitis   . Anemia   . Breast cancer ([place]) 1997   s/p radiation and lumpectomy.  . CAP (community acquired pneumonia) 12/2013  . Chronic lower back pain   . COPD (chronic obstructive pulmonary disease) ([place])   . DVT (deep venous thrombosis) ([place]) ~ 2004   LLE  . Edema extremities    lower  . Fatigue   . Hypertension    "only when I get stressed" (09/24/2016)  . Hypoalbuminemia   . Hypocalcemia   . Macular degeneration   . Osteoarthritis   . Psoriasis   . Tobacco abuse     Tobacco History: Social History   Tobacco Use  Smoking Status Current Every Day Smoker  . Packs/day: 0.25  . Years: 40.00  . Pack years: 10.00  . Types: Cigarettes  . Last attempt to quit: 12/15/2013  . Years since quitting: 5.8  Smokeless Tobacco Never Used  Tobacco Comment   started smoking again 09/2016 smoking 3-4cigs per day   Ready to quit: No Counseling given: Yes Comment: started smoking again 09/2016 smoking 3-4cigs per day   Outpatient Medications Prior to Visit  Medication Sig Dispense Refill  . Ascorbic Acid (VITAMIN C) 1000 MG tablet Take 1,000 mg by mouth 3 (three) times daily as needed.    Marland Kitchen aspirin 81 MG chewable tablet Chew 1 tablet (81 mg total) by mouth daily. 30 tablet 0  . furosemide (LASIX)  20 MG tablet Take 1 tablet (20 mg total) by mouth 2 (two) times daily. At 9am and 2pm 60 tablet 0  . losartan (COZAAR) 50 MG tablet Take 1 tablet by mouth once daily 90 tablet 3  . mometasone-formoterol (DULERA) 100-5 MCG/ACT AERO Inhale 2 puffs into the lungs 2 (two) times daily. 1 Inhaler 0  . Multiple Vitamin (MULTIVITAMIN WITH MINERALS) TABS tablet Take 1 tablet by mouth daily.    Marland Kitchen oxyCODONE (ROXICODONE) 5 MG immediate release tablet Take 1 tablet (5 mg total) by mouth every 8 (eight) hours as needed for breakthrough pain. 15 tablet 0  . tiotropium (SPIRIVA) 18 MCG inhalation capsule Place 18 mcg into inhaler and inhale daily.    Marland Kitchen  albuterol (ACCUNEB) 0.63 MG/3ML nebulizer solution Take 3 mLs (0.63 mg total) by nebulization every 6 (six) hours as needed for wheezing. 75 mL 12   No facility-administered medications prior to visit.     Review of Systems:   Constitutional:   No  weight loss, night sweats,  Fevers, chills, +fatigue, or  lassitude.  HEENT:   No headaches,  Difficulty swallowing,  Tooth/dental problems, or  Sore throat,                No sneezing, itching, ear ache, nasal congestion, post nasal drip,   CV:  No chest pain,  Orthopnea, PND, swelling in lower extremities, anasarca, dizziness, palpitations, syncope.   GI  No heartburn, indigestion, abdominal pain, nausea, vomiting, diarrhea, change in bowel habits, loss of appetite, bloody stools.   Resp:    No chest wall deformity  Skin: no rash or lesions.  GU: no dysuria, change in color of urine, no urgency or frequency.  No flank pain, no hematuria   MS:  No joint pain or swelling.  No decreased range of motion.  No back pain.    Physical Exam  BP 132/84 (BP Location: Right Arm, Cuff Size: Normal)   Pulse 79   Temp (!) 97.1 F (36.2 C) (Temporal)   Ht 5\' 5"  (1.651 m)   Wt 209 lb (94.8 kg)   SpO2 90% Comment: RA  BMI 34.78 kg/m   GEN: A/Ox3; pleasant , NAD, BMI 34    HEENT:  [place]/AT,   NOSE-clear, THROAT-clear, no lesions, no postnasal drip or exudate noted.   NECK:  Supple w/ fair ROM; no JVD; normal carotid impulses w/o bruits; no thyromegaly or nodules palpated; no lymphadenopathy.    RESP  Clear  P & A; w/o, wheezes/ rales/ or rhonchi. no accessory muscle use, no dullness to percussion  CARD:  RRR, no m/r/g, tr  peripheral edema, pulses intact, no cyanosis or clubbing.  GI:   Soft & nt; nml bowel sounds; no organomegaly or masses detected.   Musco: Warm bil, no deformities or joint swelling noted.   Neuro: alert, no focal deficits noted.    Skin: Warm, no lesions or rashes    Lab Results:  CBC  Imaging: No results  found.    PFT Results Latest Ref Rng & Units 01/03/2014  FVC-Pre L 2.05  FVC-Predicted Pre % 69  FVC-Post L 2.71  FVC-Predicted Post % 91  Pre FEV1/FVC % % 42  Post FEV1/FCV % % 46  FEV1-Pre L 0.85  FEV1-Predicted Pre % 37  FEV1-Post L 1.24  DLCO UNC% % 48  DLCO COR %Predicted % 53  TLC L 6.17  TLC % Predicted % 127  RV % Predicted % 195    No results  found for: NITRICOXIDE      Assessment & Plan:     Total patient care time 31 minutes  Nahomy Limburg, NP 10/23/2019

## 2019-10-23 NOTE — Patient Instructions (Addendum)
Continue on Dulera 2 puffs Twice daily  , rinse after use  Continue on Spiriva  daily  Please wear Oxygen 2l/m At bedtime and with activity   , goal is for O2 sats >88-90%. Order for POC  .  Saline nasal rinses As needed   Claritin 10mg  At bedtime  As needed  Drainage  Chest xray today .  Follow up with Dr. Valeta Harms in 4 months and As needed   Please contact office for sooner follow up if symptoms do not improve or worsen or seek emergency care

## 2019-10-24 NOTE — Progress Notes (Signed)
LMTCB

## 2019-10-29 ENCOUNTER — Telehealth: Payer: Self-pay | Admitting: Adult Health

## 2019-10-29 NOTE — Telephone Encounter (Signed)
Patient seen by TP last week on 2.9.21  Patient also brought patient assistance forms to the appt for [employer]  Forms completed, signed by TP and faxed to [facility] Patient is aware  Will keep forms at my desk for now to ensure [employer] has all the info they need but will sign off on phone note.

## 2019-11-02 NOTE — Progress Notes (Signed)
PCCM: Thanks for seeing her [facility] 11/02/2019 10:02 AM

## 2019-11-09 ENCOUNTER — Telehealth: Payer: Self-pay | Admitting: Adult Health

## 2019-11-09 NOTE — Telephone Encounter (Signed)
ATC patient unable to reach LM to call back office (x1)  

## 2019-11-12 NOTE — Telephone Encounter (Signed)
Called and spoke to pt. Pt states she was informed by [facility] [employer] that the provider portion of the pt assistance application was not completed. Pt is requesting a call back after forms are completed and faxed back.   Jess, can you locate these forms to see if the provider portion was completed. Will need to fax back to [facility] and then call pt. Triage can help.

## 2019-11-14 NOTE — Telephone Encounter (Signed)
Unsure why [employer] states that the provider portion is missing information - this was filled out in it's entirety.  Called [employer] at [phone] and spoke with Ubaldo Glassing who reported that they do see where patient was called and told that provider portion was incomplete.  Per Ubaldo Glassing, that page of her patient assistance was cut off - they cannot see anything above the address of the office.  She stated only that page needed to be faxed back with cover sheet and pt's name and dob.  This has been done. Called spoke with patient and discussed the above.  Patient verbalized her understanding.    Nothing further needed; will sign off.

## 2019-12-06 ENCOUNTER — Telehealth: Payer: Self-pay | Admitting: Adult Health

## 2019-12-06 NOTE — Telephone Encounter (Signed)
LMTCB for [name]  

## 2019-12-07 NOTE — Telephone Encounter (Signed)
Called Melissa at [employer] to make aware of addendum.  Nothing further needed at this time- will close encounter.

## 2019-12-07 NOTE — Telephone Encounter (Signed)
That is fine but such duplicate work for providers as it clearly states she is stable. All throughout my note. Going forward I would like to see the denial letters from insurance that it is not covered .   I will addend this note as requested because it is best for the patient to avoid overcharging an additional visit that is not necessary .

## 2019-12-07 NOTE — Assessment & Plan Note (Addendum)
COPD is stable as previous note has incorrect dx . She was stable as indicated throughout visit note .  COPD currently stable.  Check chest x-ray today Smoking cessation Plan  Patient Instructions  Continue on Dulera 2 puffs Twice daily  , rinse after use  Continue on Spiriva  daily  Please wear Oxygen 2l/m At bedtime and with activity   , goal is for O2 sats >88-90%. Order for POC  .  Saline nasal rinses As needed   Claritin 10mg  At bedtime  As needed  Drainage  Chest xray today .  Follow up with Dr. Valeta Harms in 4 months and As needed   Please contact office for sooner follow up if symptoms do not improve or worsen or seek emergency care

## 2019-12-07 NOTE — Telephone Encounter (Signed)
[name] called back-- please call back

## 2019-12-07 NOTE — Telephone Encounter (Signed)
Spoke with Melissa from [place]  She states that the note form 10/23/19 has in the visit dx the word "acute"   Visit Diagnoses    Chronic obstructive pulmonary disease, unspecified COPD type ([place])   Chronic respiratory failure with hypoxia ([place])   COPD with acute exacerbation ([place])    She states that the pt is needing o2 recert and if she was not really presenting to office acutely if we could please take this dx out of this note that would help with this. If not she will need new ov when she is not acute. Tammy, please advise, thanks!

## 2020-01-18 ENCOUNTER — Telehealth: Payer: Self-pay | Admitting: Adult Health

## 2020-01-18 NOTE — Telephone Encounter (Signed)
Pt called back about this-- renewal application for pt assistance program that was faxed back to [place] is missing page 4-- please refax.

## 2020-01-18 NOTE — Telephone Encounter (Signed)
Pt would like to be called when this has been refaxed.

## 2020-01-18 NOTE — Telephone Encounter (Signed)
ATC patient unable to reach LM to call back office (x1)  

## 2020-01-22 NOTE — Telephone Encounter (Signed)
Called [employer] at [phone] and spoke with Randall Hiss. He states that they are missing page 4. Looks like Jess documented that it was refaxed they state they did not receive it. Unable to locate application at this time. Calling patient to let her know that we need to redo application. LMTCB on patient's voicemail. Will wait to hear from patient   Needs to be faxed to [phone].

## 2020-01-23 ENCOUNTER — Telehealth: Payer: Self-pay | Admitting: Pulmonary Disease

## 2020-01-23 NOTE — Telephone Encounter (Signed)
Called and spoke with pt. Pt stated that she will bring new application for spiriva pt assistance Tuesday with her to the appt with TP. Nothing further needed.

## 2020-01-23 NOTE — Telephone Encounter (Signed)
Called and spoke with patient about [facility] application. She has her copy of original application and will bring that with her to appointment on Tuesday with Tammy. Ria Comment has been notified of plan. Application will need to be refaxed to [facility] cares and then we can scan it into her chart so we have it for future. Will route to [place] as [employer]

## 2020-01-29 ENCOUNTER — Ambulatory Visit (INDEPENDENT_AMBULATORY_CARE_PROVIDER_SITE_OTHER): Payer: Medicare Other | Admitting: Adult Health

## 2020-01-29 ENCOUNTER — Other Ambulatory Visit: Payer: Self-pay

## 2020-01-29 ENCOUNTER — Encounter: Payer: Self-pay | Admitting: Adult Health

## 2020-01-29 DIAGNOSIS — F17200 Nicotine dependence, unspecified, uncomplicated: Secondary | ICD-10-CM | POA: Diagnosis not present

## 2020-01-29 DIAGNOSIS — J449 Chronic obstructive pulmonary disease, unspecified: Secondary | ICD-10-CM

## 2020-01-29 DIAGNOSIS — J9611 Chronic respiratory failure with hypoxia: Secondary | ICD-10-CM | POA: Diagnosis not present

## 2020-01-29 MED ORDER — BUDESONIDE-FORMOTEROL FUMARATE 160-4.5 MCG/ACT IN AERO: 2.0000 | INHALATION_SPRAY | Freq: Two times a day (BID) | RESPIRATORY_TRACT | 6 refills | Status: DC

## 2020-01-29 NOTE — Patient Instructions (Addendum)
Continue on Dulera 2 puffs Twice daily  , rinse after use Will change Dulera to Symbicort and try to get Symbicort through patient assistance .  Continue on Spiriva  daily  Spiriva patient assistance forms .  Please wear Oxygen 2l/m At bedtime and with activity   , goal is for O2 sats >88-90%. Saline nasal rinses As needed   Claritin 10mg  At bedtime  As needed  Drainage  Please work on not smoking .  Follow up with Dr. Valeta Harms in 4 months and As needed  (60min slot )  Please contact office for sooner follow up if symptoms do not improve or worsen or seek emergency care

## 2020-01-29 NOTE — Progress Notes (Signed)
@Patient  ID: [name], female    DOB: 21-May-1951, 69 y.o.   MRN: [id number]  Chief Complaint  Patient presents with  . Follow-up    COPD     Referring provider: Wilber Oliphant, MD  HPI: 69 year old female active smoker followed for COPD with an asthmatic component and chronic oxygen dependent respiratory failure  TEST/EVENTS :  12/2013 PFT>Ratio 46%, FEV1 1.24L (54% pred, 44%change), TLC 6.17L (127% pred), DLCO 10.82 (48% pred),   Chest imaging:  November 2016 CT chest images personally reviewed showing diffuse centrilobular emphysema Echo 07/2015 EF 55-60%, gr 1 DD . PAP 65 , LA mod dila./Mild RA dilation  Sleep study 11/2016 [facility] 1.4/hr , moderate desats -low 81%. -recs O2At bedtime.  2D echoSystolic pressure was severely increased. PA peak pressure: 54 mm Hg (S).  Declines flu shots  01/29/2020 Follow up : COPD , O2 RF  Patient returns for 53-month follow-up.  Patient is underlying severe COPD with an asthmatic component.  Overall feels that her breathing is doing okay.  She remains very sedentary.  Gets short of breath with minimal activity . She remains on Dulera and Spiriva. Gets through patient assistance . Needs paperwork completed.  Ruthe Mannan is no longer going to be obtained through patient assistance. She remains on oxygen 2l/m with activity and At bedtime  .  Denies flare of cough or wheezing  Continues to smoke, discussed cessation.  Declines covid vaccine.    No Known Allergies  Immunization History  Administered Date(s) Administered  . Pneumococcal Polysaccharide-23 12/25/2013    Past Medical History:  Diagnosis Date  . Abnormal weight gain 08/14/2009   Qualifier: Diagnosis of  By: Lindell Noe MD, Jeneen Rinks    . Acute respiratory failure ([place])   . Allergic rhinitis   . Anemia   . Breast cancer ([place]) 1997   s/p radiation and lumpectomy.  . CAP (community acquired pneumonia) 12/2013  . Chronic lower back pain   . COPD (chronic obstructive  pulmonary disease) ([place])   . DVT (deep venous thrombosis) ([place]) ~ 2004   LLE  . Edema extremities    lower  . Fatigue   . Hypertension    "only when I get stressed" (09/24/2016)  . Hypoalbuminemia   . Hypocalcemia   . Macular degeneration   . Osteoarthritis   . Psoriasis   . Tobacco abuse     Tobacco History: Social History   Tobacco Use  Smoking Status Current Every Day Smoker  . Packs/day: 0.25  . Years: 40.00  . Pack years: 10.00  . Types: Cigarettes  Smokeless Tobacco Never Used  Tobacco Comment   4-10 cigs a day now 01/29/20   Ready to quit: No Counseling given: Yes Comment: 4-10 cigs a day now 01/29/20   Outpatient Medications Prior to Visit  Medication Sig Dispense Refill  . Ascorbic Acid (VITAMIN C) 1000 MG tablet Take 1,000 mg by mouth 3 (three) times daily as needed.    Marland Kitchen aspirin 81 MG chewable tablet Chew 1 tablet (81 mg total) by mouth daily. 30 tablet 0  . furosemide (LASIX) 20 MG tablet Take 1 tablet (20 mg total) by mouth 2 (two) times daily. At 9am and 2pm 60 tablet 0  . losartan (COZAAR) 50 MG tablet Take 1 tablet by mouth once daily 90 tablet 3  . mometasone-formoterol (DULERA) 100-5 MCG/ACT AERO Inhale 2 puffs into the lungs 2 (two) times daily. 1 Inhaler 0  . Multiple Vitamin (MULTIVITAMIN WITH MINERALS) TABS tablet Take 1  tablet by mouth daily.    Marland Kitchen oxyCODONE (ROXICODONE) 5 MG immediate release tablet Take 1 tablet (5 mg total) by mouth every 8 (eight) hours as needed for breakthrough pain. 15 tablet 0  . tiotropium (SPIRIVA) 18 MCG inhalation capsule Place 18 mcg into inhaler and inhale daily.     No facility-administered medications prior to visit.     Review of Systems:   Constitutional:   No  weight loss, night sweats,  Fevers, chills,  +fatigue, or  lassitude.  HEENT:   No headaches,  Difficulty swallowing,  Tooth/dental problems, or  Sore throat,                No sneezing, itching, ear ache, nasal congestion, post nasal drip,   CV:  No  chest pain,  Orthopnea, PND, swelling in lower extremities, anasarca, dizziness, palpitations, syncope.   GI  No heartburn, indigestion, abdominal pain, nausea, vomiting, diarrhea, change in bowel habits, loss of appetite, bloody stools.   Resp:   No chest wall deformity  Skin: no rash or lesions.  GU: no dysuria, change in color of urine, no urgency or frequency.  No flank pain, no hematuria   MS:  No joint pain or swelling.  No decreased range of motion.  No back pain.    Physical Exam  Pulse 79   Temp 98 F (36.7 C) (Temporal)   Ht 5\' 5"  (1.651 m)   Wt 202 lb 9.6 oz (91.9 kg)   SpO2 93% Comment: RA  BMI 33.71 kg/m   GEN: A/Ox3; pleasant , NAD, elderly on oxygen   HEENT:  /AT,   NOSE-clear, THROAT-clear, no lesions, no postnasal drip or exudate noted.   NECK:  Supple w/ fair ROM; no JVD; normal carotid impulses w/o bruits; no thyromegaly or nodules palpated; no lymphadenopathy.    RESP  Clear  P & A; w/o, wheezes/ rales/ or rhonchi. no accessory muscle use, no dullness to percussion  CARD:  RRR, no m/r/g, tr  peripheral edema, pulses intact, no cyanosis or clubbing.  GI:   Soft & nt; nml bowel sounds; no organomegaly or masses detected.   Musco: Warm bil, no deformities or joint swelling noted.   Neuro: alert, no focal deficits noted.    Skin: Warm, no lesions or rashes    Lab Results:    BNP Imaging: No results found.    PFT Results Latest Ref Rng & Units 01/03/2014  FVC-Pre L 2.05  FVC-Predicted Pre % 69  FVC-Post L 2.71  FVC-Predicted Post % 91  Pre FEV1/FVC % % 42  Post FEV1/FCV % % 46  FEV1-Pre L 0.85  FEV1-Predicted Pre % 37  FEV1-Post L 1.24  DLCO UNC% % 48  DLCO COR %Predicted % 53  TLC L 6.17  TLC % Predicted % 127  RV % Predicted % 195    No results found for: NITRICOXIDE      Assessment & Plan:   COPD (chronic obstructive pulmonary disease) (HCC) Severe COPD with high symptom burden.  Patient is encouraged on smoking  cessation. We will need to change Dulera to Symbicort to see if we can get patient assistance.  Spiriva paperwork completed today.  Plan  Patient Instructions  Continue on Dulera 2 puffs Twice daily  , rinse after use Will change Dulera to Symbicort and try to get Symbicort through patient assistance .  Continue on Spiriva  daily  Spiriva patient assistance forms .  Please wear Oxygen 2l/m At bedtime and with  activity   , goal is for O2 sats >88-90%. Saline nasal rinses As needed   Claritin 10mg  At bedtime  As needed  Drainage  Please work on not smoking .  Follow up with Dr. Valeta Harms in 4 months and As needed  (51min slot )  Please contact office for sooner follow up if symptoms do not improve or worsen or seek emergency care       Chronic respiratory failure ([place]) Continue on oxygen 2 L with activity and at bedtime.  O2 saturation goals are greater than 88 to 90%.  Tobacco use disorder Smoking cessation was discussed     Rexene Edison, NP 01/29/2020

## 2020-01-29 NOTE — Assessment & Plan Note (Signed)
Smoking cessation was discussed. 

## 2020-01-29 NOTE — Assessment & Plan Note (Signed)
Severe COPD with high symptom burden.  Patient is encouraged on smoking cessation. We will need to change Dulera to Symbicort to see if we can get patient assistance.  Spiriva paperwork completed today.  Plan  Patient Instructions  Continue on Dulera 2 puffs Twice daily  , rinse after use Will change Dulera to Symbicort and try to get Symbicort through patient assistance .  Continue on Spiriva  daily  Spiriva patient assistance forms .  Please wear Oxygen 2l/m At bedtime and with activity   , goal is for O2 sats >88-90%. Saline nasal rinses As needed   Claritin 10mg  At bedtime  As needed  Drainage  Please work on not smoking .  Follow up with Dr. Valeta Harms in 4 months and As needed  (42min slot )  Please contact office for sooner follow up if symptoms do not improve or worsen or seek emergency care

## 2020-01-29 NOTE — Assessment & Plan Note (Signed)
Continue on oxygen 2 L with activity and at bedtime O2 saturation goals are greater than 88 to 90%. 

## 2020-01-30 NOTE — Telephone Encounter (Signed)
Forms were filled out at her visit with Tammy. They have been faxed in.

## 2020-01-30 NOTE — Progress Notes (Signed)
PCCM: thanks for seeing her [facility] 01/30/2020 6:12 PM

## 2020-02-27 ENCOUNTER — Telehealth: Payer: Self-pay | Admitting: Adult Health

## 2020-02-27 NOTE — Telephone Encounter (Signed)
Called [employer] and was on hold x 6 min  WCB

## 2020-02-28 NOTE — Telephone Encounter (Signed)
I will have to check  Tammy Parrett CMN folder back at her desk

## 2020-02-28 NOTE — Telephone Encounter (Signed)
Tammy Piety, do you have the form that [employer] is talking about?

## 2020-03-03 NOTE — Telephone Encounter (Signed)
Anita please advise.  

## 2020-03-04 NOTE — Telephone Encounter (Signed)
I thought it was in Tammy Parrett's CMN folder but it is not. I even looked in her mail and still don't see anything for this patient

## 2020-03-05 ENCOUNTER — Telehealth: Payer: Self-pay | Admitting: Adult Health

## 2020-03-05 DIAGNOSIS — J449 Chronic obstructive pulmonary disease, unspecified: Secondary | ICD-10-CM

## 2020-03-05 DIAGNOSIS — J9611 Chronic respiratory failure with hypoxia: Secondary | ICD-10-CM

## 2020-03-05 NOTE — Telephone Encounter (Signed)
Tammy, please see message and advise.

## 2020-03-06 NOTE — Telephone Encounter (Signed)
Who is Truman Hayward ?

## 2020-03-06 NOTE — Telephone Encounter (Signed)
Will call [employer] in the AM per protocol to get a more detailed msg before sending to provider

## 2020-03-07 NOTE — Telephone Encounter (Signed)
That is fine to order ONO on room air make sure she knows that it needs to be on room air to qualify

## 2020-03-07 NOTE — Telephone Encounter (Signed)
Called and spoke with pt letting her know why we had to repeat ONO on room air and also stated to her the info per TP. Pt verbalized understanding. Order for ONO on room air has been placed. Nothing further needed.

## 2020-03-07 NOTE — Telephone Encounter (Signed)
Lorraine Lax, CMA; Sandi Raveling, Edd Fabian!   Yes that is correct. Truman Hayward works in our [employer]". According to him we need a RX and ONO results. He spoke to [place] about the O2testing that needs to be done for the pt , and also about the missing Rx for requalification.   Thanks!  ----------------------------------------------------------------- [name] - pt needs a new ONO to requalify for her night time oxygen. Can we order this?

## 2020-03-07 NOTE — Telephone Encounter (Signed)
Spoke with pt. She had lots of questions about this ONO being done. Stated, "Am I going to die if I don't wear my oxygen during this test?" Advised her that I could not answer that question for her. Pt became argumentative and still had many questions that I could not answer. She would like to speak to Tammy.

## 2020-03-07 NOTE — Telephone Encounter (Signed)
I totally understand no it would be unlikely that one night without her oxygen would cause any harm.  However this is a mandate from her insurance otherwise they will not pay for the oxygen and we have no controller.  They are forcing [place] from the insurance and the homecare company to do this she is welcome to call them and discuss this with them

## 2020-03-07 NOTE — Telephone Encounter (Signed)
Sent a community message to [employer] to see what's needed and why.

## 2020-03-07 NOTE — Telephone Encounter (Signed)
Patient is returning phone call. Patient phone number is [phone].

## 2020-03-07 NOTE — Telephone Encounter (Signed)
LMTCB x1 for pt.  

## 2020-03-31 ENCOUNTER — Telehealth: Payer: Self-pay

## 2020-03-31 NOTE — Telephone Encounter (Signed)
Results for ONO on RA preformed on 07/13: Positive for desats on RA.  Please let DME / Insurance know she is qualifed for O2 Serve desats - Please stay on O2 -Tammy Parrett   Spoke with pt and reviewed results. Pt states understanding. RN will fax ONO results to [employer] as well. Nothing further needed.

## 2020-04-07 ENCOUNTER — Telehealth: Payer: Self-pay | Admitting: Pulmonary Disease

## 2020-04-07 NOTE — Telephone Encounter (Signed)
LMTCB for [name]  

## 2020-04-08 NOTE — Telephone Encounter (Signed)
Spoke with Melissa from [facility]. She states that patient need an Office visit to follow up on her recent ONO. She qualified for oxygen when sleeping but needs recent office notes to reflect that. Last Ov was 01/29/20 and it has to be within 30 days on ONO being done which was 03/25/2020. Patient is now scheduled for a Televisit on Thursday 7/29 at 10:30am. Nothing further needed at this time.

## 2020-04-08 NOTE — Telephone Encounter (Signed)
ATC pt to schedule appointment. There was no answer and no option to leave a message. Will try back.

## 2020-04-10 ENCOUNTER — Other Ambulatory Visit: Payer: Self-pay

## 2020-04-10 ENCOUNTER — Encounter: Payer: Self-pay | Admitting: Adult Health

## 2020-04-10 ENCOUNTER — Ambulatory Visit (INDEPENDENT_AMBULATORY_CARE_PROVIDER_SITE_OTHER): Payer: Medicare Other | Admitting: Adult Health

## 2020-04-10 DIAGNOSIS — F17200 Nicotine dependence, unspecified, uncomplicated: Secondary | ICD-10-CM

## 2020-04-10 DIAGNOSIS — J9611 Chronic respiratory failure with hypoxia: Secondary | ICD-10-CM | POA: Diagnosis not present

## 2020-04-10 DIAGNOSIS — J449 Chronic obstructive pulmonary disease, unspecified: Secondary | ICD-10-CM

## 2020-04-10 NOTE — Patient Instructions (Addendum)
Continue on Symbicort 2 puffs Twice daily  , rinse after use.  Continue on Spiriva  daily  Please wear Oxygen 2l/m At bedtime and with activity   , goal is for O2 sats >88-90%. Saline nasal rinses As needed   Claritin 10mg  At bedtime  As needed  Drainage  Please work on not smoking .  Follow up with Dr. Valeta Harms in 3-4 months and As needed  (31min slot )  Please contact office for sooner follow up if symptoms do not improve or worsen or seek emergency care

## 2020-04-10 NOTE — Progress Notes (Signed)
Virtual Visit via Telephone Note  I connected with [name] on 04/10/20 at 10:30 AM EDT by telephone and verified that I am speaking with the correct person using two identifiers.  Location: Patient: Home  Provider: Office    I discussed the limitations, risks, security and privacy concerns of performing an evaluation and management service by telephone and the availability of in person appointments. I also discussed with the patient that there may be a patient responsible charge related to this service. The patient expressed understanding and agreed to proceed.   History of Present Illness: 69 year old female active smoker followed for COPD with an asthmatic component and chronic O2 dependent respiratory failure  Today's telehealth visit is a 57-month follow-up.  Patient is on oxygen 2 L with activity and at bedtime.  Recently had a overnight oximetry test done that showed desaturations on room air less than 88% qualifying her for nocturnal oxygen.  This was required by her insurance and homecare company.   Patient was recently changed from [facility] to Symbicort due to insurance formulary changes.  She says she is finishing up her Dulera and getting ready to start Symbicort.  She remains on Spiriva daily.  She denies any flare of cough or wheezing.  Gets short of breath with minimum activity and leads a sedentary lifestyle.  She denies any increased albuterol use. Declines Covid vaccine.  Patient continues smoke.  Smoking cessation discussed    Observations/Objective:  12/2013 PFT>Ratio 46%, FEV1 1.24L (54% pred, 44%change), TLC 6.17L (127% pred), DLCO 10.82 (48% pred),   Chest imaging:  November 2016 CT chest images personally reviewed showing diffuse centrilobular emphysema Echo 07/2015 EF 55-60%, gr 1 DD . PAP 65 , LA mod dila./Mild RA dilation  Sleep study 11/2016 [facility] 1.4/hr , moderate desats -low 81%. -recs O2At bedtime.  2D echoSystolic pressure was severely  increased. PA peak pressure: 54 mm Hg (S).  Assessment and Plan: Severe COPD with high symptom burden.  Patient is encouraged on smoking cessation.  Continue on Symbicort and Spiriva.  Chronic hypoxic respiratory failure continue on oxygen 2 L and at bedtime.  Plan  Patient Instructions  Continue on Symbicort 2 puffs Twice daily  , rinse after use.  Continue on Spiriva  daily  Please wear Oxygen 2l/m At bedtime and with activity   , goal is for O2 sats >88-90%. Saline nasal rinses As needed   Claritin 10mg  At bedtime  As needed  Drainage  Please work on not smoking .  Follow up with Dr. Valeta Harms in 3-4 months and As needed  (43min slot )  Please contact office for sooner follow up if symptoms do not improve or worsen or seek emergency care       Follow Up Instructions:   Follow-up in 3 to 4 months and as needed   I discussed the assessment and treatment plan with the patient. The patient was provided an opportunity to ask questions and all were answered. The patient agreed with the plan and demonstrated an understanding of the instructions.   The patient was advised to call back or seek an in-person evaluation if the symptoms worsen or if the condition fails to improve as anticipated.  I provided 22 minutes of non-face-to-face time during this encounter.   Rexene Edison, NP

## 2020-07-30 ENCOUNTER — Ambulatory Visit (INDEPENDENT_AMBULATORY_CARE_PROVIDER_SITE_OTHER): Payer: Medicare Other | Admitting: Pulmonary Disease

## 2020-07-30 ENCOUNTER — Telehealth: Payer: Self-pay | Admitting: Pulmonary Disease

## 2020-07-30 ENCOUNTER — Other Ambulatory Visit: Payer: Self-pay

## 2020-07-30 DIAGNOSIS — J9611 Chronic respiratory failure with hypoxia: Secondary | ICD-10-CM

## 2020-07-30 DIAGNOSIS — F17210 Nicotine dependence, cigarettes, uncomplicated: Secondary | ICD-10-CM | POA: Diagnosis not present

## 2020-07-30 DIAGNOSIS — J449 Chronic obstructive pulmonary disease, unspecified: Secondary | ICD-10-CM

## 2020-07-30 DIAGNOSIS — Z716 Tobacco abuse counseling: Secondary | ICD-10-CM

## 2020-07-30 DIAGNOSIS — F17200 Nicotine dependence, unspecified, uncomplicated: Secondary | ICD-10-CM

## 2020-07-30 NOTE — Telephone Encounter (Signed)
Spoke with pt. She has a new patient appointment with Dr. Valeta Harms today at 1130. Pt is requesting to have this appointment as a televisit. States, "I just don't feel up to par today."  Dr. Valeta Harms - please advise if we can change this appointment. Thanks!

## 2020-07-30 NOTE — Telephone Encounter (Signed)
BI said that the televisit would be fine. thanks

## 2020-07-30 NOTE — Telephone Encounter (Signed)
Called and spoke with pt and she is aware of televisit.

## 2020-07-30 NOTE — Progress Notes (Signed)
Virtual Visit via Telephone Note  I connected with [name] on 07/30/20 at 11:30 AM EST by telephone and verified that I am speaking with the correct person using two identifiers.  Location: Patient: Home  Provider: Office    I discussed the limitations, risks, security and privacy concerns of performing an evaluation and management service by telephone and the availability of in person appointments. I also discussed with the patient that there may be a patient responsible charge related to this service. The patient expressed understanding and agreed to proceed.  History of Present Illness:  This is a 69 year old female, past medical history of COPD, DVT, tobacco abuse.  Also history of breast cancer in 20 1997, status post radiation and lumpectomy.  Patient formally followed by Dr. Lake Bells in the office.  Last seen in 2018 by him.  Subsequent follow-up office visits with Patricia Nettle, NP.  Last seen by Patricia Nettle in the office on 04/10/2020.  Currently a active smoker, chronic hypoxemic respiratory failure on home oxygen therapy.  Currently managed COPD with Symbicort.  Patient at last office visit was counseled on smoking cessation.  OV 07/30/2020: Here today to establish care with new primary pulmonary provider. Still on o2, currently 2L. Plans for next week to see her family. Currently using symbicort and spiriva. She was on dulera in the past. She had to change due to insurance.  Unfortunately, she is still smoking 3 to 4 cigarettes/day.   Observations/Objective:  12/2013 PFT>Ratio 46%, FEV1 1.24L (54% pred, 44%change), TLC 6.17L (127% pred), DLCO 10.82 (48% pred),   Chest imaging:  November 2016 CT chest images personally reviewed showing diffuse centrilobular emphysema Echo 07/2015 EF 55-60%, gr 1 DD . PAP 65 , LA mod dila./Mild RA dilation  Sleep study 11/2016 [facility] 1.4/hr , moderate desats -low 81%. -recs O2At bedtime.  2D echoSystolic pressure was severely  increased. PA peak pressure: 54 mm Hg (S).  Assessment and Plan:  Severe COPD Chronic hypoxemic respiratory failure  Current smoker, cigarettes   Plan:  Continue current inhaler regimen  Symbicort and spiriva  Continue home DMEsupply for O2 needs  Patient was counseled on smoking cessation as described below.   Smoking Cessation Counseling:   The patient's current tobacco use: 3-4 cigarettes per day, smoked since college, at her highest 1 ppd  The patient was advised to quit and impact of smoking on their health.  I assessed the patient's willingness to attempt to quit. I provided methods and skills for cessation. We reviewed medication management of smoking session drugs if appropriate. Resources to help quit smoking were provided. A smoking cessation quit date was set: Jan. 1, 2022 Follow-up was arranged in our clinic.  The amount of time spent counseling patient was 5 mins   Follow Up Instructions:  I discussed the assessment and treatment plan with the patient. The patient was provided an opportunity to ask questions and all were answered. The patient agreed with the plan and demonstrated an understanding of the instructions.   The patient was advised to call back or seek an in-person evaluation if the symptoms worsen or if the condition fails to improve as anticipated.  RTC in 6 months, TP or BI  I provided 22 minutes of non-face-to-face time during this encounter.   Garner Nash, DO

## 2020-09-02 ENCOUNTER — Telehealth: Payer: Self-pay | Admitting: Pulmonary Disease

## 2020-09-02 NOTE — Telephone Encounter (Signed)
Attempted to call pt but unable to reach. Left message for her to return call. 

## 2020-09-03 MED ORDER — TIOTROPIUM BROMIDE MONOHYDRATE 18 MCG IN CAPS: 18.0000 ug | ORAL_CAPSULE | Freq: Every day | RESPIRATORY_TRACT | 11 refills | Status: DC

## 2020-09-03 NOTE — Telephone Encounter (Signed)
Pt returned call and states that the inhaler she needs is hand held inhaler w/ little caplets she can be reached back @ [phone].Hillery Hunter

## 2020-09-03 NOTE — Telephone Encounter (Signed)
lmtcb for pt. rx printed, signed, and faxed to [facility] cares as requested. When patient calls back please let her know that rx has been sent in.  Thanks!

## 2020-09-03 NOTE — Telephone Encounter (Signed)
lmtcb for pt.  

## 2020-09-04 ENCOUNTER — Other Ambulatory Visit: Payer: Self-pay | Admitting: Family Medicine

## 2020-09-04 DIAGNOSIS — I10 Essential (primary) hypertension: Secondary | ICD-10-CM

## 2020-09-08 NOTE — Telephone Encounter (Signed)
Called and left detailed message for pt that the script was faxed back to [facility] and to call back if anything further is needed. Will close encounter.

## 2020-09-23 ENCOUNTER — Telehealth: Payer: Self-pay | Admitting: Pulmonary Disease

## 2020-09-23 NOTE — Telephone Encounter (Signed)
Called and spoke with patient.  She is not vaccinated, but also states she has not been out of her house. She does not want to go get tested. Asking for other recommendations.   Dr. Valeta Harms please advise.

## 2020-09-23 NOTE — Telephone Encounter (Signed)
Patient is aware. Nothing further needed at this time.  ? ?

## 2020-09-23 NOTE — Telephone Encounter (Signed)
She needs to be covid tested I see no documentation for immunizations   If positive would refer to [place] hotline with history of copd.   Coriciden is fine  Drink fluids   Garner Nash, DO [facility] 09/23/2020 10:18 AM

## 2020-09-23 NOTE — Telephone Encounter (Signed)
Called spoke with patient.  She is having worsening runny nose, cough, and mucus that started 3 days ago.  She has not been tested for covid, Denies Headache, body aches, fever, sore throat, or increased shortness of breath.  Patient wears 2L oxygen all the time keeping sats in higher 90s. Patient has doubled up on her Vitamin C and wants to know if she can take her coriciden D with her BP medications.   Dr. Valeta Harms please advise.

## 2020-09-23 NOTE — Telephone Encounter (Signed)
These are my recommendations  Garner Nash, DO [facility] 09/23/2020 11:52 AM

## 2020-09-30 ENCOUNTER — Encounter: Payer: Self-pay | Admitting: Pulmonary Disease

## 2020-09-30 ENCOUNTER — Telehealth: Payer: Self-pay | Admitting: Pulmonary Disease

## 2020-09-30 DIAGNOSIS — J449 Chronic obstructive pulmonary disease, unspecified: Secondary | ICD-10-CM

## 2020-09-30 MED ORDER — SPIRIVA HANDIHALER 18 MCG IN CAPS: 18.0000 ug | ORAL_CAPSULE | Freq: Every day | RESPIRATORY_TRACT | 11 refills | Status: DC

## 2020-09-30 NOTE — Telephone Encounter (Signed)
09/30/20  Called patient.  Reviewed with patient.  Spiriva HandiHaler was refilled in December/2021.  Patient reports this was never processed.  We will refill this today  Nothing further needed  Patient reports enough medications of Symbicort 160.  Wyn Quaker, FNP

## 2020-10-04 DIAGNOSIS — Z853 Personal history of malignant neoplasm of breast: Secondary | ICD-10-CM | POA: Diagnosis not present

## 2020-10-04 DIAGNOSIS — G4733 Obstructive sleep apnea (adult) (pediatric): Secondary | ICD-10-CM | POA: Diagnosis present

## 2020-10-04 DIAGNOSIS — D539 Nutritional anemia, unspecified: Secondary | ICD-10-CM | POA: Diagnosis present

## 2020-10-04 DIAGNOSIS — R7401 Elevation of levels of liver transaminase levels: Secondary | ICD-10-CM | POA: Diagnosis not present

## 2020-10-04 DIAGNOSIS — D447 Neoplasm of uncertain behavior of aortic body and other paraganglia: Secondary | ICD-10-CM | POA: Diagnosis present

## 2020-10-04 DIAGNOSIS — R0602 Shortness of breath: Secondary | ICD-10-CM | POA: Diagnosis not present

## 2020-10-04 DIAGNOSIS — I110 Hypertensive heart disease with heart failure: Secondary | ICD-10-CM | POA: Diagnosis not present

## 2020-10-04 DIAGNOSIS — I5033 Acute on chronic diastolic (congestive) heart failure: Secondary | ICD-10-CM | POA: Diagnosis not present

## 2020-10-04 DIAGNOSIS — I5032 Chronic diastolic (congestive) heart failure: Secondary | ICD-10-CM | POA: Diagnosis not present

## 2020-10-04 DIAGNOSIS — R778 Other specified abnormalities of plasma proteins: Secondary | ICD-10-CM | POA: Diagnosis present

## 2020-10-04 DIAGNOSIS — Z79899 Other long term (current) drug therapy: Secondary | ICD-10-CM | POA: Diagnosis not present

## 2020-10-04 DIAGNOSIS — Z923 Personal history of irradiation: Secondary | ICD-10-CM | POA: Diagnosis not present

## 2020-10-04 DIAGNOSIS — J441 Chronic obstructive pulmonary disease with (acute) exacerbation: Secondary | ICD-10-CM | POA: Diagnosis not present

## 2020-10-04 DIAGNOSIS — Z20822 Contact with and (suspected) exposure to covid-19: Secondary | ICD-10-CM | POA: Diagnosis not present

## 2020-10-04 DIAGNOSIS — J432 Centrilobular emphysema: Secondary | ICD-10-CM | POA: Diagnosis present

## 2020-10-04 DIAGNOSIS — E873 Alkalosis: Secondary | ICD-10-CM | POA: Diagnosis not present

## 2020-10-04 DIAGNOSIS — G9340 Encephalopathy, unspecified: Secondary | ICD-10-CM | POA: Diagnosis not present

## 2020-10-04 DIAGNOSIS — I272 Pulmonary hypertension, unspecified: Secondary | ICD-10-CM | POA: Diagnosis present

## 2020-10-04 DIAGNOSIS — R1312 Dysphagia, oropharyngeal phase: Secondary | ICD-10-CM | POA: Diagnosis not present

## 2020-10-04 DIAGNOSIS — I2781 Cor pulmonale (chronic): Secondary | ICD-10-CM | POA: Diagnosis present

## 2020-10-04 DIAGNOSIS — D446 Neoplasm of uncertain behavior of carotid body: Secondary | ICD-10-CM | POA: Diagnosis not present

## 2020-10-04 DIAGNOSIS — Z9989 Dependence on other enabling machines and devices: Secondary | ICD-10-CM | POA: Diagnosis not present

## 2020-10-04 DIAGNOSIS — J9621 Acute and chronic respiratory failure with hypoxia: Secondary | ICD-10-CM | POA: Diagnosis present

## 2020-10-04 DIAGNOSIS — E870 Hyperosmolality and hypernatremia: Secondary | ICD-10-CM | POA: Diagnosis present

## 2020-10-04 DIAGNOSIS — J9622 Acute and chronic respiratory failure with hypercapnia: Secondary | ICD-10-CM | POA: Diagnosis present

## 2020-10-04 DIAGNOSIS — R633 Feeding difficulties, unspecified: Secondary | ICD-10-CM | POA: Diagnosis not present

## 2020-10-04 DIAGNOSIS — Z9981 Dependence on supplemental oxygen: Secondary | ICD-10-CM | POA: Diagnosis not present

## 2020-10-04 DIAGNOSIS — J438 Other emphysema: Secondary | ICD-10-CM | POA: Diagnosis present

## 2020-10-04 DIAGNOSIS — R4182 Altered mental status, unspecified: Secondary | ICD-10-CM | POA: Diagnosis not present

## 2020-10-04 DIAGNOSIS — J449 Chronic obstructive pulmonary disease, unspecified: Secondary | ICD-10-CM | POA: Diagnosis not present

## 2020-10-04 DIAGNOSIS — D72829 Elevated white blood cell count, unspecified: Secondary | ICD-10-CM | POA: Diagnosis present

## 2020-10-04 DIAGNOSIS — F17200 Nicotine dependence, unspecified, uncomplicated: Secondary | ICD-10-CM | POA: Diagnosis present

## 2020-10-04 DIAGNOSIS — I493 Ventricular premature depolarization: Secondary | ICD-10-CM | POA: Diagnosis not present

## 2020-10-04 DIAGNOSIS — I10 Essential (primary) hypertension: Secondary | ICD-10-CM | POA: Diagnosis not present

## 2020-10-21 DIAGNOSIS — J9622 Acute and chronic respiratory failure with hypercapnia: Secondary | ICD-10-CM | POA: Diagnosis not present

## 2020-10-21 DIAGNOSIS — Z7952 Long term (current) use of systemic steroids: Secondary | ICD-10-CM | POA: Diagnosis not present

## 2020-10-21 DIAGNOSIS — Z7951 Long term (current) use of inhaled steroids: Secondary | ICD-10-CM | POA: Diagnosis not present

## 2020-10-21 DIAGNOSIS — I272 Pulmonary hypertension, unspecified: Secondary | ICD-10-CM | POA: Diagnosis not present

## 2020-10-21 DIAGNOSIS — I5033 Acute on chronic diastolic (congestive) heart failure: Secondary | ICD-10-CM | POA: Diagnosis not present

## 2020-10-21 DIAGNOSIS — J441 Chronic obstructive pulmonary disease with (acute) exacerbation: Secondary | ICD-10-CM | POA: Diagnosis not present

## 2020-10-21 DIAGNOSIS — I2781 Cor pulmonale (chronic): Secondary | ICD-10-CM | POA: Diagnosis not present

## 2020-10-21 DIAGNOSIS — Z9981 Dependence on supplemental oxygen: Secondary | ICD-10-CM | POA: Diagnosis not present

## 2020-10-21 DIAGNOSIS — L89321 Pressure ulcer of left buttock, stage 1: Secondary | ICD-10-CM | POA: Diagnosis not present

## 2020-10-21 DIAGNOSIS — G4733 Obstructive sleep apnea (adult) (pediatric): Secondary | ICD-10-CM | POA: Diagnosis not present

## 2020-10-21 DIAGNOSIS — R32 Unspecified urinary incontinence: Secondary | ICD-10-CM | POA: Diagnosis not present

## 2020-10-21 DIAGNOSIS — I110 Hypertensive heart disease with heart failure: Secondary | ICD-10-CM | POA: Diagnosis not present

## 2020-10-21 DIAGNOSIS — Z7982 Long term (current) use of aspirin: Secondary | ICD-10-CM | POA: Diagnosis not present

## 2020-10-21 DIAGNOSIS — D509 Iron deficiency anemia, unspecified: Secondary | ICD-10-CM | POA: Diagnosis not present

## 2020-10-21 DIAGNOSIS — E669 Obesity, unspecified: Secondary | ICD-10-CM | POA: Diagnosis not present

## 2020-10-21 DIAGNOSIS — Z6831 Body mass index (BMI) 31.0-31.9, adult: Secondary | ICD-10-CM | POA: Diagnosis not present

## 2020-10-21 DIAGNOSIS — Z853 Personal history of malignant neoplasm of breast: Secondary | ICD-10-CM | POA: Diagnosis not present

## 2020-10-22 DIAGNOSIS — I5033 Acute on chronic diastolic (congestive) heart failure: Secondary | ICD-10-CM | POA: Diagnosis not present

## 2020-10-22 DIAGNOSIS — L89321 Pressure ulcer of left buttock, stage 1: Secondary | ICD-10-CM | POA: Diagnosis not present

## 2020-10-22 DIAGNOSIS — I110 Hypertensive heart disease with heart failure: Secondary | ICD-10-CM | POA: Diagnosis not present

## 2020-10-22 DIAGNOSIS — J9622 Acute and chronic respiratory failure with hypercapnia: Secondary | ICD-10-CM | POA: Diagnosis not present

## 2020-10-22 DIAGNOSIS — J441 Chronic obstructive pulmonary disease with (acute) exacerbation: Secondary | ICD-10-CM | POA: Diagnosis not present

## 2020-10-22 DIAGNOSIS — I272 Pulmonary hypertension, unspecified: Secondary | ICD-10-CM | POA: Diagnosis not present

## 2020-10-23 ENCOUNTER — Telehealth: Payer: Self-pay

## 2020-10-23 NOTE — Telephone Encounter (Signed)
Received call from [place] at [place] [facility] regarding home health orders. Patient was discharged from [facility] hospital on 10/16/20 and was recommended that home health follow up with patient. Unable to find active order for home health orders and patient has not been seen in clinic since 06/13/19.   Attempted to contact patient to schedule hospital follow up. Left voicemail for patient to return call to office to schedule.   Crystal is requesting orders for Skilled nursing, PT, OT and medical social worker.   Will defer verbal orders to PCP, due to lack of active home health order and length of time since last appointment.   Talbot Grumbling, RN

## 2020-10-26 NOTE — Telephone Encounter (Signed)
Covering inbox for Dr. Maudie Mercury while she is out .  Okay to give verbal orders for skilled nursing, PT, OT and social worker based on there recommendations.  She does need to be seen by our clinic prior to further requests should be filled.   Admin team - please attempt to schedule her with PCP at earliest convenience.

## 2020-10-27 NOTE — Telephone Encounter (Signed)
Called and LVM with Crystal with [facility] approving verbal orders. Included that this would be a one time order until patient is able to be seen in office for follow up.   Talbot Grumbling, RN

## 2020-10-28 DIAGNOSIS — Z515 Encounter for palliative care: Secondary | ICD-10-CM | POA: Diagnosis not present

## 2020-10-28 DIAGNOSIS — Z9981 Dependence on supplemental oxygen: Secondary | ICD-10-CM | POA: Diagnosis not present

## 2020-10-28 DIAGNOSIS — I5030 Unspecified diastolic (congestive) heart failure: Secondary | ICD-10-CM | POA: Diagnosis not present

## 2020-10-28 DIAGNOSIS — J9612 Chronic respiratory failure with hypercapnia: Secondary | ICD-10-CM | POA: Diagnosis not present

## 2020-10-28 DIAGNOSIS — J449 Chronic obstructive pulmonary disease, unspecified: Secondary | ICD-10-CM | POA: Diagnosis not present

## 2020-10-28 DIAGNOSIS — J189 Pneumonia, unspecified organism: Secondary | ICD-10-CM | POA: Diagnosis not present

## 2020-10-28 DIAGNOSIS — I110 Hypertensive heart disease with heart failure: Secondary | ICD-10-CM | POA: Diagnosis not present

## 2020-10-28 DIAGNOSIS — J9611 Chronic respiratory failure with hypoxia: Secondary | ICD-10-CM | POA: Diagnosis not present

## 2020-10-28 DIAGNOSIS — Z87891 Personal history of nicotine dependence: Secondary | ICD-10-CM | POA: Diagnosis not present

## 2020-10-28 DIAGNOSIS — Z79899 Other long term (current) drug therapy: Secondary | ICD-10-CM | POA: Diagnosis not present

## 2020-10-30 DIAGNOSIS — I272 Pulmonary hypertension, unspecified: Secondary | ICD-10-CM | POA: Diagnosis not present

## 2020-10-30 DIAGNOSIS — J441 Chronic obstructive pulmonary disease with (acute) exacerbation: Secondary | ICD-10-CM | POA: Diagnosis not present

## 2020-10-30 DIAGNOSIS — J9622 Acute and chronic respiratory failure with hypercapnia: Secondary | ICD-10-CM | POA: Diagnosis not present

## 2020-10-30 DIAGNOSIS — I5033 Acute on chronic diastolic (congestive) heart failure: Secondary | ICD-10-CM | POA: Diagnosis not present

## 2020-10-30 DIAGNOSIS — I110 Hypertensive heart disease with heart failure: Secondary | ICD-10-CM | POA: Diagnosis not present

## 2020-10-30 DIAGNOSIS — L89321 Pressure ulcer of left buttock, stage 1: Secondary | ICD-10-CM | POA: Diagnosis not present

## 2020-11-12 DIAGNOSIS — J9622 Acute and chronic respiratory failure with hypercapnia: Secondary | ICD-10-CM | POA: Diagnosis not present

## 2020-11-12 DIAGNOSIS — I5033 Acute on chronic diastolic (congestive) heart failure: Secondary | ICD-10-CM | POA: Diagnosis not present

## 2020-11-12 DIAGNOSIS — I110 Hypertensive heart disease with heart failure: Secondary | ICD-10-CM | POA: Diagnosis not present

## 2020-11-12 DIAGNOSIS — I272 Pulmonary hypertension, unspecified: Secondary | ICD-10-CM | POA: Diagnosis not present

## 2020-11-12 DIAGNOSIS — J441 Chronic obstructive pulmonary disease with (acute) exacerbation: Secondary | ICD-10-CM | POA: Diagnosis not present

## 2020-11-12 DIAGNOSIS — L89321 Pressure ulcer of left buttock, stage 1: Secondary | ICD-10-CM | POA: Diagnosis not present

## 2020-11-20 DIAGNOSIS — J441 Chronic obstructive pulmonary disease with (acute) exacerbation: Secondary | ICD-10-CM | POA: Diagnosis not present

## 2020-11-20 DIAGNOSIS — G4733 Obstructive sleep apnea (adult) (pediatric): Secondary | ICD-10-CM | POA: Diagnosis not present

## 2020-11-20 DIAGNOSIS — Z7952 Long term (current) use of systemic steroids: Secondary | ICD-10-CM | POA: Diagnosis not present

## 2020-11-20 DIAGNOSIS — Z7982 Long term (current) use of aspirin: Secondary | ICD-10-CM | POA: Diagnosis not present

## 2020-11-20 DIAGNOSIS — E669 Obesity, unspecified: Secondary | ICD-10-CM | POA: Diagnosis not present

## 2020-11-20 DIAGNOSIS — Z853 Personal history of malignant neoplasm of breast: Secondary | ICD-10-CM | POA: Diagnosis not present

## 2020-11-20 DIAGNOSIS — I110 Hypertensive heart disease with heart failure: Secondary | ICD-10-CM | POA: Diagnosis not present

## 2020-11-20 DIAGNOSIS — I272 Pulmonary hypertension, unspecified: Secondary | ICD-10-CM | POA: Diagnosis not present

## 2020-11-20 DIAGNOSIS — Z7951 Long term (current) use of inhaled steroids: Secondary | ICD-10-CM | POA: Diagnosis not present

## 2020-11-20 DIAGNOSIS — I2781 Cor pulmonale (chronic): Secondary | ICD-10-CM | POA: Diagnosis not present

## 2020-11-20 DIAGNOSIS — I5033 Acute on chronic diastolic (congestive) heart failure: Secondary | ICD-10-CM | POA: Diagnosis not present

## 2020-11-20 DIAGNOSIS — Z9981 Dependence on supplemental oxygen: Secondary | ICD-10-CM | POA: Diagnosis not present

## 2020-11-20 DIAGNOSIS — D509 Iron deficiency anemia, unspecified: Secondary | ICD-10-CM | POA: Diagnosis not present

## 2020-11-20 DIAGNOSIS — L89321 Pressure ulcer of left buttock, stage 1: Secondary | ICD-10-CM | POA: Diagnosis not present

## 2020-11-20 DIAGNOSIS — R32 Unspecified urinary incontinence: Secondary | ICD-10-CM | POA: Diagnosis not present

## 2020-11-20 DIAGNOSIS — J9622 Acute and chronic respiratory failure with hypercapnia: Secondary | ICD-10-CM | POA: Diagnosis not present

## 2020-11-20 DIAGNOSIS — Z6831 Body mass index (BMI) 31.0-31.9, adult: Secondary | ICD-10-CM | POA: Diagnosis not present

## 2020-11-27 DIAGNOSIS — I110 Hypertensive heart disease with heart failure: Secondary | ICD-10-CM | POA: Diagnosis not present

## 2020-11-27 DIAGNOSIS — J9622 Acute and chronic respiratory failure with hypercapnia: Secondary | ICD-10-CM | POA: Diagnosis not present

## 2020-11-27 DIAGNOSIS — J441 Chronic obstructive pulmonary disease with (acute) exacerbation: Secondary | ICD-10-CM | POA: Diagnosis not present

## 2020-11-27 DIAGNOSIS — I272 Pulmonary hypertension, unspecified: Secondary | ICD-10-CM | POA: Diagnosis not present

## 2020-11-27 DIAGNOSIS — L89321 Pressure ulcer of left buttock, stage 1: Secondary | ICD-10-CM | POA: Diagnosis not present

## 2020-11-27 DIAGNOSIS — I5033 Acute on chronic diastolic (congestive) heart failure: Secondary | ICD-10-CM | POA: Diagnosis not present

## 2020-12-12 ENCOUNTER — Other Ambulatory Visit: Payer: Self-pay | Admitting: Family Medicine

## 2020-12-12 DIAGNOSIS — I10 Essential (primary) hypertension: Secondary | ICD-10-CM

## 2020-12-12 NOTE — Telephone Encounter (Signed)
Patient needs appointment for future refills.

## 2020-12-15 NOTE — Telephone Encounter (Signed)
Called patient and scheduled appointment.

## 2020-12-16 ENCOUNTER — Telehealth: Payer: Self-pay | Admitting: Pulmonary Disease

## 2020-12-16 NOTE — Telephone Encounter (Signed)
Called and spoke with pt who stated she spoke with representative of the pt assistance program for Symbicort. She stated that they were going to be faxing paperwork to our office for her Symbicort and she wanted to know once we had received it and once it had been signed.  Stated to her that we would notify her and she verbalized understanding. Will await fax.

## 2020-12-19 ENCOUNTER — Ambulatory Visit (INDEPENDENT_AMBULATORY_CARE_PROVIDER_SITE_OTHER): Payer: Medicare Other | Admitting: Adult Health

## 2020-12-19 ENCOUNTER — Encounter: Payer: Self-pay | Admitting: Adult Health

## 2020-12-19 ENCOUNTER — Other Ambulatory Visit: Payer: Self-pay

## 2020-12-19 ENCOUNTER — Ambulatory Visit (INDEPENDENT_AMBULATORY_CARE_PROVIDER_SITE_OTHER): Payer: Medicare Other

## 2020-12-19 VITALS — BP 144/88 | HR 85 | Temp 97.3°F | Ht 65.0 in | Wt 187.2 lb

## 2020-12-19 DIAGNOSIS — J449 Chronic obstructive pulmonary disease, unspecified: Secondary | ICD-10-CM

## 2020-12-19 DIAGNOSIS — I5032 Chronic diastolic (congestive) heart failure: Secondary | ICD-10-CM | POA: Diagnosis not present

## 2020-12-19 DIAGNOSIS — J9611 Chronic respiratory failure with hypoxia: Secondary | ICD-10-CM | POA: Diagnosis not present

## 2020-12-19 DIAGNOSIS — R0602 Shortness of breath: Secondary | ICD-10-CM | POA: Diagnosis not present

## 2020-12-19 DIAGNOSIS — F17200 Nicotine dependence, unspecified, uncomplicated: Secondary | ICD-10-CM

## 2020-12-19 DIAGNOSIS — J441 Chronic obstructive pulmonary disease with (acute) exacerbation: Secondary | ICD-10-CM | POA: Diagnosis not present

## 2020-12-19 DIAGNOSIS — J439 Emphysema, unspecified: Secondary | ICD-10-CM | POA: Diagnosis not present

## 2020-12-19 DIAGNOSIS — J9612 Chronic respiratory failure with hypercapnia: Secondary | ICD-10-CM | POA: Diagnosis not present

## 2020-12-19 MED ORDER — BUDESONIDE-FORMOTEROL FUMARATE 160-4.5 MCG/ACT IN AERO: 2.0000 | INHALATION_SPRAY | Freq: Two times a day (BID) | RESPIRATORY_TRACT | 3 refills | Status: DC

## 2020-12-19 NOTE — Assessment & Plan Note (Signed)
Recent COPD exacerbation with acute on chronic hypercarbic and hypoxic respiratory failure.  Patient was admitted to [facility] in February.  Is clinically improved.  Is doing well on current regimen.  Congratulated on smoking cessation.  Chest x-ray today shows no acute process.  Plan  Patient Instructions  Chest xray today .  Continue on Symbicort and Spiriva  Continue on Trilogy machine at night time.  Trilogy download from [employer].  Great job not smoking  Continue on Oxygen 2l/m  Follow with Dr. Valeta Harms in 6 weeks and As needed   Please contact office for sooner follow up if symptoms do not improve or worsen or seek emergency care

## 2020-12-19 NOTE — Assessment & Plan Note (Signed)
Congratulated on smoking cessation.  

## 2020-12-19 NOTE — Patient Instructions (Addendum)
Chest xray today .  Continue on Symbicort and Spiriva  Continue on Trilogy machine at night time.  Trilogy download from [employer].  Great job not smoking  Continue on Oxygen 2l/m  Follow with Dr. Valeta Harms in 6 weeks and As needed   Please contact office for sooner follow up if symptoms do not improve or worsen or seek emergency care

## 2020-12-19 NOTE — Assessment & Plan Note (Signed)
Hypercarbic and hypoxic respiratory failure.  Currently compensated on oxygen at 2 L. Patient is continue on nocturnal trilogy device   Plan . Patient Instructions  Chest xray today .  Continue on Symbicort and Spiriva  Continue on Trilogy machine at night time.  Trilogy download from [employer].  Great job not smoking  Continue on Oxygen 2l/m  Follow with Dr. Valeta Harms in 6 weeks and As needed   Please contact office for sooner follow up if symptoms do not improve or worsen or seek emergency care

## 2020-12-19 NOTE — Assessment & Plan Note (Signed)
Appears euvolemic on exam no evidence of fluid overload continue on current regimen

## 2020-12-19 NOTE — Progress Notes (Signed)
@Patient  ID: [name], female    DOB: 1951-06-05, 70 y.o.   MRN: [id number]  Chief Complaint  Patient presents with  . Follow-up    Referring provider: Wilber Oliphant, MD  HPI: 70 year old female former smoker January 2022 followed for severe COPD chronic hypercarbic and hypoxic respiratory failure on oxygen Medical history significant for multiple comorbidities including hypertension, congestive heart failure, pulmonary hypertension, previous breast cancer.  TEST/EVENTS :  12/2013 PFT>Ratio 46%, FEV1 1.24L (54% pred, 44%change), TLC 6.17L (127% pred), DLCO 10.82 (48% pred),   Chest imaging:  November 2016 CT chest images personally reviewed showing diffuse centrilobular emphysema Echo 07/2015 EF 55-60%, gr 1 DD . PAP 65 , LA mod dila./Mild RA dilation  Sleep study 11/2016 [facility] 1.4/hr , moderate desats -low 81%. -recs O2At bedtime.  2D echoSystolic pressure was severely increased. PA peak pressure: 54 mm Hg (S).   12/19/2020 Follow up : COPD , O2 RF  Patient returns for a 60-month follow-up.  Patient has underlying moderate to severe COPD and chronic hypoxic and hypercarbic respiratory failure. She remains on Symbicort and Spiriva. Patient was admitted January 2022 for acute on chronic hypoxic and hypercarbic respiratory failure, encephalopathy, COPD exacerbation.  She was treated with IV antibiotics, steroids and required BiPAP support.  She was discharged on trilogy noninvasive vent at discharge. Was treated  With antibiotics for Pneumonia .   She says she has been doing better. Breathing is back to baseline .  Patient gets her Symbicort through patient assistance.  She needs paperwork completed today. Feels that the trilogy device is helping.  She has stopped smoking.  She was congratulated on cessation. Remains on oxygen 2 L.  No increased oxygen demands. DME is [employer]   Patient was recommended for palliative care at last hospitalization.  We discussed goals  of care.  Patient declines palliative care.  No Known Allergies  Immunization History  Administered Date(s) Administered  . Pneumococcal Polysaccharide-23 12/25/2013    Past Medical History:  Diagnosis Date  . Abnormal weight gain 08/14/2009   Qualifier: Diagnosis of  By: Lindell Noe MD, Jeneen Rinks    . Acute respiratory failure ([place])   . Allergic rhinitis   . Anemia   . Breast cancer ([place]) 1997   s/p radiation and lumpectomy.  . CAP (community acquired pneumonia) 12/2013  . Chronic lower back pain   . COPD (chronic obstructive pulmonary disease) ([place])   . DVT (deep venous thrombosis) ([place]) ~ 2004   LLE  . Edema extremities    lower  . Fatigue   . Hypertension    "only when I get stressed" (09/24/2016)  . Hypoalbuminemia   . Hypocalcemia   . Macular degeneration   . Osteoarthritis   . Psoriasis   . Tobacco abuse     Tobacco History: Social History   Tobacco Use  Smoking Status Former Smoker  . Packs/day: 0.25  . Years: 50.00  . Pack years: 12.50  . Types: Cigarettes  . Quit date: 09/13/2020  . Years since quitting: 0.2  Smokeless Tobacco Never Used   Counseling given: Not Answered   Outpatient Medications Prior to Visit  Medication Sig Dispense Refill  . Ascorbic Acid (VITAMIN C) 1000 MG tablet Take 1,000 mg by mouth 3 (three) times daily as needed.    Marland Kitchen aspirin 81 MG chewable tablet Chew 1 tablet (81 mg total) by mouth daily. 30 tablet 0  . budesonide-formoterol (SYMBICORT) 160-4.5 MCG/ACT inhaler Inhale 2 puffs into the lungs 2 (two) times  daily. 1 Inhaler 6  . losartan (COZAAR) 50 MG tablet Take 1 tablet by mouth once daily 90 tablet 0  . Multiple Vitamin (MULTIVITAMIN WITH MINERALS) TABS tablet Take 1 tablet by mouth daily.    Marland Kitchen oxyCODONE (ROXICODONE) 5 MG immediate release tablet Take 1 tablet (5 mg total) by mouth every 8 (eight) hours as needed for breakthrough pain. 15 tablet 0  . tiotropium (SPIRIVA HANDIHALER) 18 MCG inhalation capsule Place 1 capsule (18 mcg  total) into inhaler and inhale daily. 30 capsule 11  . albuterol (VENTOLIN HFA) 108 (90 Base) MCG/ACT inhaler Inhale 2 puffs into the lungs every 6 (six) hours as needed. (Patient not taking: Reported on 12/19/2020)    . furosemide (LASIX) 20 MG tablet Take 1 tablet (20 mg total) by mouth 2 (two) times daily. At 9am and 2pm 60 tablet 0  . mometasone-formoterol (DULERA) 100-5 MCG/ACT AERO Inhale 2 puffs into the lungs 2 (two) times daily. (Patient not taking: No sig reported) 1 Inhaler 0   No facility-administered medications prior to visit.     Review of Systems:   Constitutional:   No  weight loss, night sweats,  Fevers, chills,  +fatigue, or  lassitude.  HEENT:   No headaches,  Difficulty swallowing,  Tooth/dental problems, or  Sore throat,                No sneezing, itching, ear ache, nasal congestion, post nasal drip,   CV:  No chest pain,  Orthopnea, PND, swelling in lower extremities, anasarca, dizziness, palpitations, syncope.   GI  No heartburn, indigestion, abdominal pain, nausea, vomiting, diarrhea, change in bowel habits, loss of appetite, bloody stools.   Resp:    No chest wall deformity  Skin: no rash or lesions.  GU: no dysuria, change in color of urine, no urgency or frequency.  No flank pain, no hematuria   MS:  No joint pain or swelling.  No decreased range of motion.  No back pain.    Physical Exam  BP (!) 144/88 (BP Location: Right Arm, Cuff Size: Normal)   Pulse 85   Temp (!) 97.3 F (36.3 C) (Temporal)   Ht 5\' 5"  (1.651 m)   Wt 187 lb 3.2 oz (84.9 kg)   SpO2 95% Comment: 2L [place]  BMI 31.15 kg/m   GEN: A/Ox3; pleasant , NAD,  elderly on oxygen   HEENT:  [place]/AT, , NOSE-clear, THROAT-clear, no lesions, no postnasal drip or exudate noted.   NECK:  Supple w/ fair ROM; no JVD; normal carotid impulses w/o bruits; no thyromegaly or nodules palpated; no lymphadenopathy.    RESP  Clear  P & A; w/o, wheezes/ rales/ or rhonchi. no accessory muscle use, no  dullness to percussion  CARD:  RRR, no m/r/g, 1+ peripheral edema, pulses intact, no cyanosis or clubbing.  GI:   Soft & nt; nml bowel sounds; no organomegaly or masses detected.   Musco: Warm bil, no deformities or joint swelling noted.   Neuro: alert, no focal deficits noted.    Skin: Warm, no lesions or rashes    Lab Results:   BNP  Imaging: DG Chest 2 View  Result Date: 12/19/2020 CLINICAL DATA:  Shortness of breath EXAM: CHEST - 2 VIEW COMPARISON:  October 23, 2019 chest radiograph; chest CT September 25, 2016 FINDINGS: There is underlying emphysematous change, better delineated on prior CT. Lungs are mildly hyperexpanded. There is no edema or consolidation. There are areas of interstitial thickening. The heart size and  pulmonary vascularity are normal. No adenopathy. There is aortic atherosclerosis. There is lower thoracic dextroscoliosis. There are surgical clips in the left axilla. IMPRESSION: Underlying emphysematous change. Areas of interstitial thickening are likely secondary to underlying emphysematous change and potential chronic bronchitis. No edema or airspace opacity. Heart size within normal limits. No adenopathy appreciable. There is aortic atherosclerosis. There is postoperative change in the left axillary region. Aortic Atherosclerosis (ICD10-I70.0) and Emphysema (ICD10-J43.9). Electronically Signed   By: Lowella Grip III M.D.   On: 12/19/2020 16:21      PFT Results Latest Ref Rng & Units 01/03/2014  FVC-Pre L 2.05  FVC-Predicted Pre % 69  FVC-Post L 2.71  FVC-Predicted Post % 91  Pre FEV1/FVC % % 42  Post FEV1/FCV % % 46  FEV1-Pre L 0.85  FEV1-Predicted Pre % 37  FEV1-Post L 1.24  DLCO uncorrected ml/min/mmHg 10.82  DLCO [facility]% % 48  DLVA Predicted % 53  TLC L 6.17  TLC % Predicted % 127  RV % Predicted % 195    No results found for: NITRICOXIDE      Assessment & Plan:   COPD with acute exacerbation (HCC) Recent COPD exacerbation with acute on  chronic hypercarbic and hypoxic respiratory failure.  Patient was admitted to [facility] in February.  Is clinically improved.  Is doing well on current regimen.  Congratulated on smoking cessation.  Chest x-ray today shows no acute process.  Plan  Patient Instructions  Chest xray today .  Continue on Symbicort and Spiriva  Continue on Trilogy machine at night time.  Trilogy download from [employer].  Great job not smoking  Continue on Oxygen 2l/m  Follow with Dr. Valeta Harms in 6 weeks and As needed   Please contact office for sooner follow up if symptoms do not improve or worsen or seek emergency care       Chronic respiratory failure ([place]) Hypercarbic and hypoxic respiratory failure.  Currently compensated on oxygen at 2 L. Patient is continue on nocturnal trilogy device   Plan . Patient Instructions  Chest xray today .  Continue on Symbicort and Spiriva  Continue on Trilogy machine at night time.  Trilogy download from [employer].  Great job not smoking  Continue on Oxygen 2l/m  Follow with Dr. Valeta Harms in 6 weeks and As needed   Please contact office for sooner follow up if symptoms do not improve or worsen or seek emergency care       Diastolic congestive heart failure ([place]) Appears euvolemic on exam no evidence of fluid overload continue on current regimen  Tobacco use disorder Congratulated on smoking cessation     Rexene Edison, NP 12/19/2020

## 2020-12-19 NOTE — Telephone Encounter (Signed)
Per Janett Billow who was working with [employer], application for pt assistance had been received. Nothing further needed.

## 2020-12-25 NOTE — Progress Notes (Signed)
PCCM: thanks for seeing her Garner Nash, DO [facility] 12/25/2020 7:21 AM

## 2020-12-29 ENCOUNTER — Telehealth: Payer: Self-pay | Admitting: Adult Health

## 2020-12-29 NOTE — Telephone Encounter (Signed)
I have called the pt and she is aware that [employer] is processing her order and that this will be shipped out to her.  Nothing further is needed.

## 2021-01-02 ENCOUNTER — Ambulatory Visit: Payer: Medicare Other | Admitting: Family Medicine

## 2021-01-30 ENCOUNTER — Other Ambulatory Visit: Payer: Self-pay

## 2021-01-30 ENCOUNTER — Ambulatory Visit (INDEPENDENT_AMBULATORY_CARE_PROVIDER_SITE_OTHER): Payer: Medicare Other | Admitting: Pulmonary Disease

## 2021-01-30 ENCOUNTER — Encounter: Payer: Self-pay | Admitting: Pulmonary Disease

## 2021-01-30 VITALS — BP 166/70 | HR 94 | Temp 99.0°F | Wt 181.0 lb

## 2021-01-30 DIAGNOSIS — J9611 Chronic respiratory failure with hypoxia: Secondary | ICD-10-CM

## 2021-01-30 DIAGNOSIS — J449 Chronic obstructive pulmonary disease, unspecified: Secondary | ICD-10-CM | POA: Diagnosis not present

## 2021-01-30 DIAGNOSIS — Z720 Tobacco use: Secondary | ICD-10-CM | POA: Diagnosis not present

## 2021-01-30 DIAGNOSIS — I2721 Secondary pulmonary arterial hypertension: Secondary | ICD-10-CM | POA: Diagnosis not present

## 2021-01-30 DIAGNOSIS — J9612 Chronic respiratory failure with hypercapnia: Secondary | ICD-10-CM

## 2021-01-30 MED ORDER — BREZTRI AEROSPHERE 160-9-4.8 MCG/ACT IN AERO: 2.0000 | INHALATION_SPRAY | Freq: Two times a day (BID) | RESPIRATORY_TRACT | 1 refills | Status: DC

## 2021-01-30 NOTE — Progress Notes (Signed)
Synopsis: Referred in May 2022 for former patient Dr. Lake Bells, COPD.  PCP by Wilber Oliphant, MD  Subjective:   PATIENT ID: [name] GENDER: female DOB: 02-Nov-1950, MRN: [id number]  Chief Complaint  Patient presents with  . Consult    SOB with activity/ warmth     This is a 70 year old female only seen in the office by me via telephone visit to establish care.  Last seen in the office in April 2022 by Patricia Nettle.  Former smoker quit in January 2022, severe COPD, chronic hypercapnic and hypoxemic respiratory failure on oxygen.  Additional comorbidities include hypertension, congestive heart failure, pulmonary hypertension, history of breast cancer.  Had prior echo in 2016 with a pulmonary artery pressure of 65 has been followed for some time on Symbicort plus Spiriva inhaler management.  During last hospitalization patient was recommended to have follow-up with outpatient palliative care.  Thankfully she has quit smoking.  Currently uses Trelegy ventilator at night.  OV 01/30/2021: Today's first time we are meeting face-to-face.  Here today for follow-up regarding COPD management, pulmonary hypertension. Doing well on symbiocort and spiriva. She is using her bipap machine at night time.    Past Medical History:  Diagnosis Date  . Abnormal weight gain 08/14/2009   Qualifier: Diagnosis of  By: Lindell Noe MD, Jeneen Rinks    . Acute respiratory failure ([place])   . Allergic rhinitis   . Anemia   . Breast cancer ([place]) 1997   s/p radiation and lumpectomy.  . CAP (community acquired pneumonia) 12/2013  . Chronic lower back pain   . COPD (chronic obstructive pulmonary disease) ([place])   . DVT (deep venous thrombosis) ([place]) ~ 2004   LLE  . Edema extremities    lower  . Fatigue   . Hypertension    "only when I get stressed" (09/24/2016)  . Hypoalbuminemia   . Hypocalcemia   . Macular degeneration   . Osteoarthritis   . Psoriasis   . Tobacco abuse      Family History  Problem Relation Age of  Onset  . Osteoarthritis Mother   . Hypertension Mother   . Thyroid disease Mother   . Heart disease Father   . Cancer Father 31       prostate ca  . Diabetes Daughter   . Diabetes Maternal Grandfather      Past Surgical History:  Procedure Laterality Date  . APPENDECTOMY    . BREAST LUMPECTOMY Left 1997  . JOINT REPLACEMENT    . KNEE ARTHROSCOPY Left    X2 ("before the replacement)  . ROUX-EN-Y GASTRIC BYPASS  May 2004  . TONSILLECTOMY    . TOTAL KNEE ARTHROPLASTY Left 1995  . TUBAL LIGATION      Social History   Socioeconomic History  . Marital status: Divorced    Spouse name: Not on file  . Number of children: Not on file  . Years of education: Not on file  . Highest education level: Not on file  Occupational History  . Not on file  Tobacco Use  . Smoking status: Former Smoker    Packs/day: 0.25    Years: 50.00    Pack years: 12.50    Types: Cigarettes    Quit date: 09/13/2020    Years since quitting: 0.3  . Smokeless tobacco: Never Used  Vaping Use  . Vaping Use: Never used  Substance and Sexual Activity  . Alcohol use: Yes    Alcohol/week: 0.0 standard drinks    Comment:  09/24/2016 "drank back in the day; now only on special occasions"  . Drug use: No  . Sexual activity: Never  Other Topics Concern  . Not on file  Social History Narrative  . Not on file   Social Determinants of Health   Financial Resource Strain: Not on file  Food Insecurity: Not on file  Transportation Needs: Not on file  Physical Activity: Not on file  Stress: Not on file  Social Connections: Not on file  Intimate Partner Violence: Not on file     No Known Allergies   Outpatient Medications Prior to Visit  Medication Sig Dispense Refill  . albuterol (VENTOLIN HFA) 108 (90 Base) MCG/ACT inhaler Inhale 2 puffs into the lungs every 6 (six) hours as needed.    . budesonide-formoterol (SYMBICORT) 160-4.5 MCG/ACT inhaler Inhale 2 puffs into the lungs 2 (two) times daily. 1 Inhaler  6  . losartan (COZAAR) 50 MG tablet Take 1 tablet by mouth once daily 90 tablet 0  . Multiple Vitamin (MULTIVITAMIN WITH MINERALS) TABS tablet Take 1 tablet by mouth daily.    Marland Kitchen oxyCODONE (ROXICODONE) 5 MG immediate release tablet Take 1 tablet (5 mg total) by mouth every 8 (eight) hours as needed for breakthrough pain. 15 tablet 0  . tiotropium (SPIRIVA HANDIHALER) 18 MCG inhalation capsule Place 1 capsule (18 mcg total) into inhaler and inhale daily. 30 capsule 11  . Ascorbic Acid (VITAMIN C) 1000 MG tablet Take 1,000 mg by mouth 3 (three) times daily as needed. (Patient not taking: Reported on 01/30/2021)    . aspirin 81 MG chewable tablet Chew 1 tablet (81 mg total) by mouth daily. (Patient not taking: Reported on 01/30/2021) 30 tablet 0  . budesonide-formoterol (SYMBICORT) 160-4.5 MCG/ACT inhaler Inhale 2 puffs into the lungs 2 (two) times daily. (Patient not taking: Reported on 01/30/2021) 3 each 3  . furosemide (LASIX) 20 MG tablet Take 1 tablet (20 mg total) by mouth 2 (two) times daily. At 9am and 2pm 60 tablet 0  . mometasone-formoterol (DULERA) 100-5 MCG/ACT AERO Inhale 2 puffs into the lungs 2 (two) times daily. (Patient not taking: No sig reported) 1 Inhaler 0   No facility-administered medications prior to visit.    Review of Systems  Constitutional: Negative for chills, fever, malaise/fatigue and weight loss.  HENT: Negative for hearing loss, sore throat and tinnitus.   Eyes: Negative for blurred vision and double vision.  Respiratory: Positive for shortness of breath. Negative for cough, hemoptysis, sputum production, wheezing and stridor.   Cardiovascular: Negative for chest pain, palpitations, orthopnea, leg swelling and PND.  Gastrointestinal: Negative for abdominal pain, constipation, diarrhea, heartburn, nausea and vomiting.  Genitourinary: Negative for dysuria, hematuria and urgency.  Musculoskeletal: Negative for joint pain and myalgias.  Skin: Negative for itching and  rash.  Neurological: Negative for dizziness, tingling, weakness and headaches.  Endo/Heme/Allergies: Negative for environmental allergies. Does not bruise/bleed easily.  Psychiatric/Behavioral: Negative for depression. The patient is not nervous/anxious and does not have insomnia.   All other systems reviewed and are negative.    Objective:  Physical Exam Vitals reviewed.  Constitutional:      General: She is not in acute distress.    Appearance: She is well-developed.  HENT:     Head: Normocephalic and atraumatic.     Mouth/Throat:     Pharynx: No oropharyngeal exudate.  Eyes:     Conjunctiva/sclera: Conjunctivae normal.     Pupils: Pupils are equal, round, and reactive to light.  Neck:  Vascular: No JVD.     Trachea: No tracheal deviation.     Comments: Loss of supraclavicular fat Cardiovascular:     Rate and Rhythm: Normal rate and regular rhythm.     Heart sounds: S1 normal and S2 normal.     Comments: Distant heart tones Pulmonary:     Effort: No tachypnea or accessory muscle usage.     Breath sounds: No stridor. Decreased breath sounds (throughout all lung fields) present. No wheezing, rhonchi or rales.  Abdominal:     General: Bowel sounds are normal. There is no distension.     Palpations: Abdomen is soft.     Tenderness: There is no abdominal tenderness.  Musculoskeletal:        General: Deformity (muscle wasting ) present.     Right lower leg: Edema present.     Left lower leg: Edema present.  Skin:    General: Skin is warm and dry.     Capillary Refill: Capillary refill takes less than 2 seconds.     Findings: No rash.  Neurological:     Mental Status: She is alert and oriented to person, place, and time.  Psychiatric:        Behavior: Behavior normal.      Vitals:   01/30/21 1452 01/30/21 1454  BP: (!) 166/70   Pulse: 94   Temp: 99 F (37.2 C)   TempSrc: Temporal   SpO2: (!) 88% 93%  Weight: 181 lb (82.1 kg)    93% on 3 L pulsed BMI  Readings from Last 3 Encounters:  01/30/21 30.12 kg/m  12/19/20 31.15 kg/m  01/29/20 33.71 kg/m   Wt Readings from Last 3 Encounters:  01/30/21 181 lb (82.1 kg)  12/19/20 187 lb 3.2 oz (84.9 kg)  01/29/20 202 lb 9.6 oz (91.9 kg)     CBC    Component Value Date/Time   WBC 6.1 10/03/2016 0313   RBC 4.65 10/03/2016 0313   HGB 10.9 (L) 10/03/2016 0313   HCT 41.7 10/03/2016 0313   PLT 158 10/03/2016 0313   MCV 89.7 10/03/2016 0313   MCH 23.4 (L) 10/03/2016 0313   MCHC 26.1 (L) 10/03/2016 0313   RDW 16.5 (H) 10/03/2016 0313   LYMPHSABS 1.0 09/30/2016 0433   MONOABS 0.4 09/30/2016 0433   EOSABS 0.2 09/30/2016 0433   BASOSABS 0.0 09/30/2016 0433    Chest Imaging: Chest x-ray 12/19/2020: Bilateral evidence of emphysema, interstitial thickening. The patient's images have been independently reviewed by me.   10/05/2020: CONCLUSION:   1. No evidence of pulmonary emboli.  2. Scattered tree-in-bud opacities and airspace consolidation in the right, greater than left, lower lobes concerning for aspiration and/or pneumonia.  3. Mild diffuse bilateral interstitial edema and small right pleural effusion.  4. Dilation of the main pulmonary artery, which may reflect a component of pulmonary arterial hypertension.  5. Advanced destructive centrilobular emphysema.  6. Asymmetric right breast soft tissue thickening. Recommend correlation with prior mammogram if available.     Pulmonary Functions Testing Results: PFT Results Latest Ref Rng & Units 01/03/2014  FVC-Pre L 2.05  FVC-Predicted Pre % 69  FVC-Post L 2.71  FVC-Predicted Post % 91  Pre FEV1/FVC % % 42  Post FEV1/FCV % % 46  FEV1-Pre L 0.85  FEV1-Predicted Pre % 37  FEV1-Post L 1.24  DLCO uncorrected ml/min/mmHg 10.82  DLCO UNC% % 48  DLVA Predicted % 53  TLC L 6.17  TLC % Predicted % 127  RV % Predicted %  195    FeNO:   Pathology:   Echocardiogram:   Heart Catheterization:     Assessment & Plan:      ICD-10-CM   1. Chronic obstructive pulmonary disease, unspecified COPD type ([place])  J44.9   2. PAH (pulmonary artery hypertension) ([facility])  I27.21   3. Morbid obesity ([place])  E66.01   4. Chronic respiratory failure with hypoxia and hypercapnia ([facility])  J96.11    J96.12   5. Tobacco abuse  Z72.0     Discussion:  This is a 70 year old female, significant obstructive pulmonary disease.  Also has chronic hypoxemic respiratory failure on 3 L.  Previous pulmonary function tests were completed in 2015 which shows severely reduced ratio of 46 and a FEV1 of 54% predicted.  Additionally has a DLCO of 48%.  A prior echocardiogram in our system with a PAP of 65.  Most recent echo in 2020 revealed right ventricular systolic pressure at 65.7.  Additionally patient has CT chest evidence from recent hospital admission at [facility] in January revealed dilated main pulmonary artery consistent with pulmonary hypertension.  She also had an asymmetric right breast soft tissue thickening recommending mammogram.  She has not followed up on this.  She states she was not told about this in her last hospitalization.  She has a history of breast cancer on the left that was resected many years ago.  Plan: Today the patient was counseled on importance of wearing her oxygen.  She states she does not routinely do this during the day and then takes it off and puts it on when she feels like she needs it. We explained the difference of being short of breath versus hypoxemia. I think she may have group 3 pulmonary hypertension. I am not sure if it is out of proportion or not to her lung disease.  Her DLCO is pretty severely reduced however she does have significant emphysema on previous CT images. She has not had a right heart catheterization. I will get her set up with one of my partners who specializes in pulmonary hypertension. In the meantime we will get her off of the Spiriva HandiHaler and consolidate her inhaler regimen from  Spiriva plus Symbicort to triple therapy breztri   Case to be discussed with Dr. Silas Flood.   Current Outpatient Medications:  .  albuterol (VENTOLIN HFA) 108 (90 Base) MCG/ACT inhaler, Inhale 2 puffs into the lungs every 6 (six) hours as needed., Disp: , Rfl:  .  budesonide-formoterol (SYMBICORT) 160-4.5 MCG/ACT inhaler, Inhale 2 puffs into the lungs 2 (two) times daily., Disp: 1 Inhaler, Rfl: 6 .  losartan (COZAAR) 50 MG tablet, Take 1 tablet by mouth once daily, Disp: 90 tablet, Rfl: 0 .  Multiple Vitamin (MULTIVITAMIN WITH MINERALS) TABS tablet, Take 1 tablet by mouth daily., Disp: , Rfl:  .  oxyCODONE (ROXICODONE) 5 MG immediate release tablet, Take 1 tablet (5 mg total) by mouth every 8 (eight) hours as needed for breakthrough pain., Disp: 15 tablet, Rfl: 0 .  tiotropium (SPIRIVA HANDIHALER) 18 MCG inhalation capsule, Place 1 capsule (18 mcg total) into inhaler and inhale daily., Disp: 30 capsule, Rfl: 11 .  Ascorbic Acid (VITAMIN C) 1000 MG tablet, Take 1,000 mg by mouth 3 (three) times daily as needed. (Patient not taking: Reported on 01/30/2021), Disp: , Rfl:  .  aspirin 81 MG chewable tablet, Chew 1 tablet (81 mg total) by mouth daily. (Patient not taking: Reported on 01/30/2021), Disp: 30 tablet, Rfl: 0 .  budesonide-formoterol (SYMBICORT)  160-4.5 MCG/ACT inhaler, Inhale 2 puffs into the lungs 2 (two) times daily. (Patient not taking: Reported on 01/30/2021), Disp: 3 each, Rfl: 3 .  furosemide (LASIX) 20 MG tablet, Take 1 tablet (20 mg total) by mouth 2 (two) times daily. At 9am and 2pm, Disp: 60 tablet, Rfl: 0 .  mometasone-formoterol (DULERA) 100-5 MCG/ACT AERO, Inhale 2 puffs into the lungs 2 (two) times daily. (Patient not taking: No sig reported), Disp: 1 Inhaler, Rfl: 0  I spent 61 minutes dedicated to the care of this patient on the date of this encounter to include pre-visit review of records, face-to-face time with the patient discussing conditions above, post visit ordering of  testing, clinical documentation with the electronic health record, making appropriate referrals as documented, and communicating necessary findings to members of the patients care team.   Garner Nash, [facility] 01/30/2021 2:56 PM

## 2021-01-30 NOTE — Patient Instructions (Addendum)
Thank you for visiting Dr. Valeta Harms at [facility]. Today we recommend the following:  Breztri samples  New prescription for breztri and financial aid application from [place]  Return if symptoms worsen or fail to improve.  Will have Dr. Silas Flood (pulmonary hypertension doc) take a look at your echo.  You may need to see him and discuss right heart catheterization.     Please do your part to reduce the spread of COVID-19.

## 2021-02-17 ENCOUNTER — Telehealth: Payer: Self-pay | Admitting: Pulmonary Disease

## 2021-02-17 NOTE — Telephone Encounter (Signed)
Leigh, Has Patient assistance for [employer] [name] been received and faxed for Patient?   Message routed to [facility], CMA with Dr.Icard

## 2021-02-23 ENCOUNTER — Telehealth: Payer: Self-pay | Admitting: Pulmonary Disease

## 2021-02-23 NOTE — Telephone Encounter (Signed)
I have called the pt and LM on VM.   I have not seen any papers for the pt assistance.  I advised her that I will look out for these and let her know once we receive them.  I advised her to call the office if anything further is needed. Will hold in my box until papers are received.

## 2021-02-23 NOTE — Telephone Encounter (Signed)
Duplicate message.      Closing encounter.

## 2021-02-23 NOTE — Telephone Encounter (Signed)
Leigh, please advise if this has been sent. Thank you.    Pt is still needing medication from previous encounter Judithann Sauger). She has filled out her patient assitance paperwork and is in need of this medicationASAP. Pharmacy is [facility]. Please advise.

## 2021-02-24 MED ORDER — BREZTRI AEROSPHERE 160-9-4.8 MCG/ACT IN AERO: 2.0000 | INHALATION_SPRAY | Freq: Two times a day (BID) | RESPIRATORY_TRACT | 3 refills | Status: DC

## 2021-02-24 NOTE — Telephone Encounter (Signed)
Papers for the pt assistance have been found and completed and I have printed out the rx for the breztri.  This will be placed in BI folder to be signed and will need to be faxed once completed.

## 2021-02-25 NOTE — Telephone Encounter (Signed)
Called and spoke with patient. She is aware that Dr. Valeta Harms has signed the application and I have faxed everything. She stated that she is about to run out of her [name] but she does have her [name] to use. I looked at the samples count and we do not have any Breztri.   Nothing further needed at time of call.

## 2021-04-15 DIAGNOSIS — Z20822 Contact with and (suspected) exposure to covid-19: Secondary | ICD-10-CM | POA: Diagnosis not present

## 2021-05-11 DIAGNOSIS — I10 Essential (primary) hypertension: Secondary | ICD-10-CM | POA: Diagnosis not present

## 2021-05-11 DIAGNOSIS — J449 Chronic obstructive pulmonary disease, unspecified: Secondary | ICD-10-CM | POA: Diagnosis not present

## 2021-09-01 ENCOUNTER — Telehealth: Payer: Self-pay | Admitting: Pulmonary Disease

## 2021-09-02 NOTE — Telephone Encounter (Signed)
I went through all of Dr Juline Patch forms and see nothing on this pt. I called her to let her know this, there was no answer so LMTCB.

## 2021-09-03 NOTE — Telephone Encounter (Signed)
Looked through TP's papers and there was no application in there on pt. Called and spoke with pt letting her know that as well as the info from [place]. Stated to pt that if she had a computer, she could go on [employer]'s website to fill out her portion of the paperwork and we could fill out our portion and get it signed but pt then said that her printer does not have any ink so she has no way to be able to print the application off.  Stated to pt that we could reprint the application at our office and either place it up front for someone to come by to pick up or place it in the mail for her and pt was hesitant about having that done due to the issues she has already run into.   I then stated to pt that she could call [employer] to let them know what she has run into in the past when it comes down to time to renew her application to see if they could give her any recommendations and pt stated she would go that route. Provided pt with [employer]'s phone number for her to call. Nothing further needed.

## 2021-09-10 DIAGNOSIS — Z20822 Contact with and (suspected) exposure to covid-19: Secondary | ICD-10-CM | POA: Diagnosis not present

## 2021-09-11 ENCOUNTER — Telehealth: Payer: Self-pay

## 2021-09-11 NOTE — Telephone Encounter (Deleted)
FYI

## 2021-09-11 NOTE — Telephone Encounter (Signed)
Received fax stating that patient is approved for PAP through [employer] for Breztri from 09/10/2021 to 09/12/2022. Approval letter sent to scan center.  Separate fax states that a new prescription for Judithann Sauger will need to be sent in to [employer] for dispensing in 2023. Placed in Dr. Juline Patch box for f/u.

## 2021-10-17 ENCOUNTER — Telehealth: Payer: Self-pay | Admitting: Pulmonary Disease

## 2021-10-17 NOTE — Telephone Encounter (Signed)
Patient called in over the weekend stating her paperwork for refills of breztri for PAP through [employer] were filled out incorrectly and now she is not able to get her inhaler refills. She reports the 3rd page of the form needs to be filled out again regarding the number of refills. Only one option can be selected as she says both options were filled out previously making the paperwork invalid.   Please call patient on Monday 2/6 to work on correcting the paperwork issue.   Thanks, Freda Jackson, MD [facility] Office: [phone]

## 2021-10-19 NOTE — Telephone Encounter (Signed)
This message was initially sent to Dr. Valeta Harms as this is his patient on 09/11/21.  Message from [employer] requested a new script (Received by Brynda Peon).  Discussed with Ronney Asters and we will work together to get resolved.

## 2021-10-19 NOTE — Telephone Encounter (Signed)
Okay we will need to look at .

## 2021-10-20 NOTE — Telephone Encounter (Signed)
I got the form completed by [facility]. The form has been faxed. Nothing further needed.  Fran Lowes, CMA

## 2021-10-22 DIAGNOSIS — Z20822 Contact with and (suspected) exposure to covid-19: Secondary | ICD-10-CM | POA: Diagnosis not present

## 2021-10-29 DIAGNOSIS — Z20822 Contact with and (suspected) exposure to covid-19: Secondary | ICD-10-CM | POA: Diagnosis not present

## 2021-12-03 DIAGNOSIS — J449 Chronic obstructive pulmonary disease, unspecified: Secondary | ICD-10-CM | POA: Diagnosis not present

## 2021-12-03 DIAGNOSIS — I10 Essential (primary) hypertension: Secondary | ICD-10-CM | POA: Diagnosis not present

## 2021-12-03 DIAGNOSIS — E782 Mixed hyperlipidemia: Secondary | ICD-10-CM | POA: Diagnosis not present

## 2021-12-03 DIAGNOSIS — D696 Thrombocytopenia, unspecified: Secondary | ICD-10-CM | POA: Diagnosis not present

## 2021-12-03 DIAGNOSIS — Z0000 Encounter for general adult medical examination without abnormal findings: Secondary | ICD-10-CM | POA: Diagnosis not present

## 2021-12-04 ENCOUNTER — Telehealth: Payer: Self-pay | Admitting: Pulmonary Disease

## 2021-12-04 NOTE — Telephone Encounter (Signed)
Routing encounter to Nora as she manages Dr. Juline Patch papers. ?

## 2021-12-04 NOTE — Telephone Encounter (Signed)
Called and spoke with patient. I let the patient know we got her application and that I will have it filled out and signed by TP today and I will give her a phone call as soon as I fax it. Nothing further needed. ?

## 2021-12-11 DIAGNOSIS — Z20822 Contact with and (suspected) exposure to covid-19: Secondary | ICD-10-CM | POA: Diagnosis not present

## 2021-12-30 DIAGNOSIS — Z20822 Contact with and (suspected) exposure to covid-19: Secondary | ICD-10-CM | POA: Diagnosis not present

## 2022-01-05 DIAGNOSIS — Z20822 Contact with and (suspected) exposure to covid-19: Secondary | ICD-10-CM | POA: Diagnosis not present

## 2022-01-06 DIAGNOSIS — I10 Essential (primary) hypertension: Secondary | ICD-10-CM | POA: Diagnosis not present

## 2022-01-12 DIAGNOSIS — Z20822 Contact with and (suspected) exposure to covid-19: Secondary | ICD-10-CM | POA: Diagnosis not present

## 2022-01-14 DIAGNOSIS — Z20822 Contact with and (suspected) exposure to covid-19: Secondary | ICD-10-CM | POA: Diagnosis not present

## 2022-05-26 ENCOUNTER — Telehealth: Payer: Self-pay | Admitting: Pulmonary Disease

## 2022-05-26 NOTE — Telephone Encounter (Signed)
Patient called to request a refill for her Albuterol Sulfate inhaler.  She would like it sent to [facility].  Patient also has scheduled an appt. With Dr. Valeta Harms for October.  Any questions, please call patient at [phone]

## 2022-05-28 MED ORDER — ALBUTEROL SULFATE HFA 108 (90 BASE) MCG/ACT IN AERS: 2.0000 | INHALATION_SPRAY | Freq: Four times a day (QID) | RESPIRATORY_TRACT | 0 refills | Status: DC | PRN

## 2022-05-28 NOTE — Telephone Encounter (Signed)
Rx sent to preferred pharmacy for pt. Called and spoke with pt letting her know that this had been done and let her know that she would need appt prior to any further refills. Pt verbalized understanding. Nothing further needed.

## 2022-05-28 NOTE — Telephone Encounter (Signed)
Pt calling back about her rescue inhaler

## 2022-06-02 DIAGNOSIS — I10 Essential (primary) hypertension: Secondary | ICD-10-CM | POA: Diagnosis not present

## 2022-06-02 DIAGNOSIS — R238 Other skin changes: Secondary | ICD-10-CM | POA: Diagnosis not present

## 2022-06-02 DIAGNOSIS — J449 Chronic obstructive pulmonary disease, unspecified: Secondary | ICD-10-CM | POA: Diagnosis not present

## 2022-06-02 DIAGNOSIS — D649 Anemia, unspecified: Secondary | ICD-10-CM | POA: Diagnosis not present

## 2022-06-17 ENCOUNTER — Ambulatory Visit (INDEPENDENT_AMBULATORY_CARE_PROVIDER_SITE_OTHER): Payer: Medicare Other | Admitting: Pulmonary Disease

## 2022-06-17 ENCOUNTER — Encounter: Payer: Self-pay | Admitting: Pulmonary Disease

## 2022-06-17 VITALS — BP 120/80 | HR 80 | Ht 65.0 in | Wt 164.8 lb

## 2022-06-17 DIAGNOSIS — J9612 Chronic respiratory failure with hypercapnia: Secondary | ICD-10-CM | POA: Diagnosis not present

## 2022-06-17 DIAGNOSIS — I2721 Secondary pulmonary arterial hypertension: Secondary | ICD-10-CM | POA: Diagnosis not present

## 2022-06-17 DIAGNOSIS — J9611 Chronic respiratory failure with hypoxia: Secondary | ICD-10-CM | POA: Diagnosis not present

## 2022-06-17 DIAGNOSIS — I5032 Chronic diastolic (congestive) heart failure: Secondary | ICD-10-CM | POA: Diagnosis not present

## 2022-06-17 DIAGNOSIS — J449 Chronic obstructive pulmonary disease, unspecified: Secondary | ICD-10-CM

## 2022-06-17 NOTE — Progress Notes (Signed)
Synopsis: Referred in May 2022 for former patient Dr. Lake Bells, COPD.  PCP by No ref. provider found  Subjective:   PATIENT ID: [name] GENDER: female DOB: [birth year], MRN: [id number]  Chief Complaint  Patient presents with   Follow-up    This is a 71 year old female only seen in the office by me via telephone visit to establish care.  Last seen in the office in April 2022 by Patricia Nettle.  Former smoker quit in January 2022, severe COPD, chronic hypercapnic and hypoxemic respiratory failure on oxygen.  Additional comorbidities include hypertension, congestive heart failure, pulmonary hypertension, history of breast cancer.  Had prior echo in 2016 with a pulmonary artery pressure of 65 has been followed for some time on Symbicort plus Spiriva inhaler management.  During last hospitalization patient was recommended to have follow-up with outpatient palliative care.  Thankfully she has quit smoking.  Currently uses Trelegy ventilator at night.  OV 01/30/2021: Today's first time we are meeting face-to-face.  Here today for follow-up regarding COPD management, pulmonary hypertension. Doing well on symbiocort and spiriva. She is using her bipap machine at night time.   OV 06/17/2022: Here today for follow up regarding copd. Currently on breztri and as needed albuterol.  Patient still has some shortness of breath with exertion.  Still using oxygen.  As needed use of Lasix because she feels like it makes her pee too much.  Using Trelegy ventilator at night.  Needs a new refill prescription for Breztri.    Past Medical History:  Diagnosis Date   Abnormal weight gain 08/14/2009   Qualifier: Diagnosis of  By: Lindell Noe MD, Jeneen Rinks     Acute respiratory failure [facility])    Allergic rhinitis    Anemia    Breast cancer ([place]) 1997   s/p radiation and lumpectomy.   CAP (community acquired pneumonia) 12/2013   Chronic lower back pain    COPD (chronic obstructive pulmonary disease) ([place])    DVT (deep  venous thrombosis) ([place]) ~ 2004   LLE   Edema extremities    lower   Fatigue    Hypertension    "only when I get stressed" (09/24/2016)   Hypoalbuminemia    Hypocalcemia    Macular degeneration    Osteoarthritis    Psoriasis    Tobacco abuse      Family History  Problem Relation Age of Onset   Osteoarthritis Mother    Hypertension Mother    Thyroid disease Mother    Heart disease Father    Cancer Father 67       prostate ca   Diabetes Daughter    Diabetes Maternal Grandfather      Past Surgical History:  Procedure Laterality Date   APPENDECTOMY     BREAST LUMPECTOMY Left 1997   JOINT REPLACEMENT     KNEE ARTHROSCOPY Left    X2 ("before the replacement)   ROUX-EN-Y GASTRIC BYPASS  May 2004   TONSILLECTOMY     TOTAL KNEE ARTHROPLASTY Left 1995   TUBAL LIGATION      Social History   Socioeconomic History   Marital status: Divorced    Spouse name: Not on file   Number of children: Not on file   Years of education: Not on file   Highest education level: Not on file  Occupational History   Not on file  Tobacco Use   Smoking status: Former    Packs/day: 0.25    Years: 50.00    Total pack years: 12.50  Types: Cigarettes    Quit date: 09/13/2020    Years since quitting: 1.7   Smokeless tobacco: Never  Vaping Use   Vaping Use: Never used  Substance and Sexual Activity   Alcohol use: Yes    Alcohol/week: 0.0 standard drinks of alcohol    Comment: 09/24/2016 "drank back in the day; now only on special occasions"   Drug use: No   Sexual activity: Never  Other Topics Concern   Not on file  Social History Narrative   Not on file   Social Determinants of Health   Financial Resource Strain: Not on file  Food Insecurity: Not on file  Transportation Needs: Not on file  Physical Activity: Not on file  Stress: Not on file  Social Connections: Not on file  Intimate Partner Violence: Not on file     No Known Allergies   Outpatient Medications Prior to Visit   Medication Sig Dispense Refill   albuterol (VENTOLIN HFA) 108 (90 Base) MCG/ACT inhaler Inhale 2 puffs into the lungs every 6 (six) hours as needed. 8 g 0   Budeson-Glycopyrrol-Formoterol (BREZTRI AEROSPHERE) 160-9-4.8 MCG/ACT AERO Inhale 2 puffs into the lungs in the morning and at bedtime. 32.1 g 3   amLODipine (NORVASC) 5 MG tablet Take 5 mg by mouth daily.     Ascorbic Acid (VITAMIN C) 1000 MG tablet Take 1,000 mg by mouth 3 (three) times daily as needed. (Patient not taking: Reported on 01/30/2021)     aspirin 81 MG chewable tablet Chew 1 tablet (81 mg total) by mouth daily. (Patient not taking: Reported on 01/30/2021) 30 tablet 0   furosemide (LASIX) 20 MG tablet Take 1 tablet (20 mg total) by mouth 2 (two) times daily. At 9am and 2pm 60 tablet 0   losartan (COZAAR) 50 MG tablet Take 1 tablet by mouth once daily 90 tablet 0   Multiple Vitamin (MULTIVITAMIN WITH MINERALS) TABS tablet Take 1 tablet by mouth daily.     oxyCODONE (ROXICODONE) 5 MG immediate release tablet Take 1 tablet (5 mg total) by mouth every 8 (eight) hours as needed for breakthrough pain. 15 tablet 0   budesonide-formoterol (SYMBICORT) 160-4.5 MCG/ACT inhaler Inhale 2 puffs into the lungs 2 (two) times daily. 1 Inhaler 6   budesonide-formoterol (SYMBICORT) 160-4.5 MCG/ACT inhaler Inhale 2 puffs into the lungs 2 (two) times daily. (Patient not taking: Reported on 01/30/2021) 3 each 3   mometasone-formoterol (DULERA) 100-5 MCG/ACT AERO Inhale 2 puffs into the lungs 2 (two) times daily. (Patient not taking: No sig reported) 1 Inhaler 0   tiotropium (SPIRIVA HANDIHALER) 18 MCG inhalation capsule Place 1 capsule (18 mcg total) into inhaler and inhale daily. 30 capsule 11   No facility-administered medications prior to visit.    Review of Systems  Constitutional:  Positive for malaise/fatigue. Negative for chills, fever and weight loss.  HENT:  Negative for hearing loss, sore throat and tinnitus.   Eyes:  Negative for blurred  vision and double vision.  Respiratory:  Positive for shortness of breath. Negative for cough, hemoptysis, sputum production, wheezing and stridor.   Cardiovascular:  Positive for leg swelling. Negative for chest pain, palpitations, orthopnea and PND.  Gastrointestinal:  Negative for abdominal pain, constipation, diarrhea, heartburn, nausea and vomiting.  Genitourinary:  Negative for dysuria, hematuria and urgency.  Musculoskeletal:  Negative for joint pain and myalgias.  Skin:  Negative for itching and rash.  Neurological:  Negative for dizziness, tingling, weakness and headaches.  Endo/Heme/Allergies:  Negative for environmental allergies.  Does not bruise/bleed easily.  Psychiatric/Behavioral:  Negative for depression. The patient is not nervous/anxious and does not have insomnia.   All other systems reviewed and are negative.    Objective:  Physical Exam Vitals reviewed.  Constitutional:      General: She is not in acute distress.    Appearance: She is well-developed.  HENT:     Head: Normocephalic and atraumatic.  Eyes:     General: No scleral icterus.    Conjunctiva/sclera: Conjunctivae normal.     Pupils: Pupils are equal, round, and reactive to light.  Neck:     Vascular: No JVD.     Trachea: No tracheal deviation.  Cardiovascular:     Rate and Rhythm: Normal rate and regular rhythm.     Heart sounds: Normal heart sounds. No murmur heard. Pulmonary:     Effort: Pulmonary effort is normal. No tachypnea, accessory muscle usage or respiratory distress.     Breath sounds: No stridor. No wheezing, rhonchi or rales.     Comments: Severely diminished breath sounds bilaterally Abdominal:     General: There is no distension.     Palpations: Abdomen is soft.     Tenderness: There is no abdominal tenderness.  Musculoskeletal:        General: Deformity present. No tenderness.     Cervical back: Neck supple.     Right lower leg: Edema present.     Left lower leg: Edema present.   Lymphadenopathy:     Cervical: No cervical adenopathy.  Skin:    General: Skin is warm and dry.     Capillary Refill: Capillary refill takes less than 2 seconds.     Findings: No rash.  Neurological:     Mental Status: She is alert and oriented to person, place, and time.  Psychiatric:        Behavior: Behavior normal.      Vitals:   06/17/22 1332  BP: 120/80  Pulse: 80  SpO2: 92%  Weight: 164 lb 12.8 oz (74.8 kg)  Height: '5\' 5"'$  (1.651 m)   92% on 3 L pulsed BMI Readings from Last 3 Encounters:  06/17/22 27.42 kg/m  01/30/21 30.12 kg/m  12/19/20 31.15 kg/m   Wt Readings from Last 3 Encounters:  06/17/22 164 lb 12.8 oz (74.8 kg)  01/30/21 181 lb (82.1 kg)  12/19/20 187 lb 3.2 oz (84.9 kg)     CBC    Component Value Date/Time   WBC 6.1 10/03/2016 0313   RBC 4.65 10/03/2016 0313   HGB 10.9 (L) 10/03/2016 0313   HCT 41.7 10/03/2016 0313   PLT 158 10/03/2016 0313   MCV 89.7 10/03/2016 0313   MCH 23.4 (L) 10/03/2016 0313   MCHC 26.1 (L) 10/03/2016 0313   RDW 16.5 (H) 10/03/2016 0313   LYMPHSABS 1.0 09/30/2016 0433   MONOABS 0.4 09/30/2016 0433   EOSABS 0.2 09/30/2016 0433   BASOSABS 0.0 09/30/2016 0433    Chest Imaging: Chest x-ray 12/19/2020: Bilateral evidence of emphysema, interstitial thickening. The patient's images have been independently reviewed by me.   10/05/2020: CONCLUSION:   1.  No evidence of pulmonary emboli.  2.  Scattered tree-in-bud opacities and airspace consolidation in the right, greater than left, lower lobes concerning for aspiration and/or pneumonia.  3.  Mild diffuse bilateral interstitial edema and small right pleural effusion.  4.  Dilation of the main pulmonary artery, which may reflect a component of pulmonary arterial hypertension.  5.  Advanced destructive centrilobular emphysema.  6.  Asymmetric right breast soft tissue thickening. Recommend correlation with prior mammogram if available.     Pulmonary Functions Testing  Results:    Latest Ref Rng & Units 01/03/2014    1:34 PM  PFT Results  FVC-Pre L 2.05   FVC-Predicted Pre % 69   FVC-Post L 2.71   FVC-Predicted Post % 91   Pre FEV1/FVC % % 42   Post FEV1/FCV % % 46   FEV1-Pre L 0.85   FEV1-Predicted Pre % 37   FEV1-Post L 1.24   DLCO uncorrected ml/min/mmHg 10.82   DLCO UNC% % 48   DLVA Predicted % 53   TLC L 6.17   TLC % Predicted % 127   RV % Predicted % 195     FeNO:   Pathology:   Echocardiogram:   Heart Catheterization:     Assessment & Plan:     ICD-10-CM   1. Chronic obstructive pulmonary disease, unspecified COPD type ([place])  J44.9 Ambulatory Referral for Lung Cancer Scre    2. PAH (pulmonary artery hypertension) (H[facility])  I27.21     3. Morbid obesity ([place])  E66.01     4. Chronic respiratory failure with hypoxia and hypercapnia (H[facility])  J96.11    J96.12     5. Chronic diastolic congestive heart failure (HCC)  I50.32       Discussion:  This is a 71 year old female, significant obstructive lung disease, chronic hypoxemic respiratory failure on 3 L, prior PFTs in 2015 with severely reduced ratio of 46, FEV1 of 57% predicted, DLCO 48% predicted, prior echocardiogram with a pulmonary artery pressure of 65.  Echo in 2020 with elevated right ventricular systolic pressure.  Plan: Repeat echocardiogram today. Follow-up appointment with Dr. Silas Flood. Would prefer to have a pro con discussion whether not should benefit from right heart catheterization. Continue Breztri inhaler Continue as needed albuterol.    Current Outpatient Medications:    albuterol (VENTOLIN HFA) 108 (90 Base) MCG/ACT inhaler, Inhale 2 puffs into the lungs every 6 (six) hours as needed., Disp: 8 g, Rfl: 0   Budeson-Glycopyrrol-Formoterol (BREZTRI AEROSPHERE) 160-9-4.8 MCG/ACT AERO, Inhale 2 puffs into the lungs in the morning and at bedtime., Disp: 32.1 g, Rfl: 3   amLODipine (NORVASC) 5 MG tablet, Take 5 mg by mouth daily., Disp: , Rfl:    Ascorbic Acid  (VITAMIN C) 1000 MG tablet, Take 1,000 mg by mouth 3 (three) times daily as needed. (Patient not taking: Reported on 01/30/2021), Disp: , Rfl:    aspirin 81 MG chewable tablet, Chew 1 tablet (81 mg total) by mouth daily. (Patient not taking: Reported on 01/30/2021), Disp: 30 tablet, Rfl: 0   furosemide (LASIX) 20 MG tablet, Take 1 tablet (20 mg total) by mouth 2 (two) times daily. At 9am and 2pm, Disp: 60 tablet, Rfl: 0   losartan (COZAAR) 50 MG tablet, Take 1 tablet by mouth once daily, Disp: 90 tablet, Rfl: 0   Multiple Vitamin (MULTIVITAMIN WITH MINERALS) TABS tablet, Take 1 tablet by mouth daily., Disp: , Rfl:    oxyCODONE (ROXICODONE) 5 MG immediate release tablet, Take 1 tablet (5 mg total) by mouth every 8 (eight) hours as needed for breakthrough pain., Disp: 15 tablet, Rfl: 0    Garner Nash, DO [facility] 06/17/2022 1:49 PM

## 2022-06-17 NOTE — Patient Instructions (Signed)
Thank you for visiting Dr. Valeta Harms at [facility]. Today we recommend the following:  Orders Placed This Encounter  Procedures   Ambulatory Referral for Lung Cancer Scre   ECHOCARDIOGRAM COMPLETE   Return in about 4 weeks (around 07/15/2022) for w/ Dr. Silas Flood for new [facility] consult .    Please do your part to reduce the spread of COVID-19.

## 2022-06-28 ENCOUNTER — Other Ambulatory Visit: Payer: Self-pay | Admitting: *Deleted

## 2022-06-28 MED ORDER — BREZTRI AEROSPHERE 160-9-4.8 MCG/ACT IN AERO: 2.0000 | INHALATION_SPRAY | Freq: Two times a day (BID) | RESPIRATORY_TRACT | 3 refills | Status: DC

## 2022-07-07 ENCOUNTER — Ambulatory Visit (INDEPENDENT_AMBULATORY_CARE_PROVIDER_SITE_OTHER): Payer: Medicare Other

## 2022-07-07 DIAGNOSIS — I2721 Secondary pulmonary arterial hypertension: Secondary | ICD-10-CM

## 2022-07-07 LAB — ECHOCARDIOGRAM COMPLETE
Area-P 1/2: 3.68 cm2
S' Lateral: 2.15 cm

## 2022-07-08 ENCOUNTER — Telehealth: Payer: Self-pay | Admitting: Pulmonary Disease

## 2022-07-08 ENCOUNTER — Telehealth: Payer: Self-pay | Admitting: Acute Care

## 2022-07-08 DIAGNOSIS — Q254 Congenital malformation of aorta unspecified: Secondary | ICD-10-CM

## 2022-07-08 NOTE — Telephone Encounter (Signed)
Called patient and went over results with her. She was advised of the time for the CT scan but was not happy with the time for tomorrow. PCCs worked on scheduling. And Eric Form NP is calling patient to go over new time of scan and to go over results with her. Nothing further needed from nursing clinical at this stand point

## 2022-07-08 NOTE — Telephone Encounter (Signed)
I have called the patient with the results of her echo. I explained that the echo shows a mobile plaque in her ascending aorta, with mild dilation of the ascending aorta measuring 41 mm. I explained that we need to do a CT Chest to better evaluate this finding.  I explained that the Dr. Valeta Harms has written an order for an emergent CT Angio Chest Aorta with and without contrast and gating. We had been able to get the patient scheduled on the scanner at [facility] at 3 pm, but she stated she was unable to get there in short interval as she does not have transportation.   I explained that this is very important to have this scan done as  she could potentially  be at risk of stroke  which could have  devastating health consequences. She tried to negotiate for a later time on a different scanner, but I explained there was no availability, and to get this appointment we had to have a supervisor approve it as they are already at 400% capacity.   She is scheduled for a CT Angio Chest Aorta with and without contrast and gating at 7:30 am at [facility] at Entr[address]. She verbalized understanding that she understood she needs to be there and that this is very important. I have asked her to seek emergency care in the ED at [facility] if she has any change in her condition overnight . Weakness, facial droop, change in mentation or inability to speak. She agreed to seek emergency care with any changes.   Dr. Valeta Harms will follow up with results once he is made aware tomorrow.   Magdalen Spatz, MSN, AGACNP-BC [place] See Amion for personal pager PCCM on call pager [phone]  07/08/2022

## 2022-07-08 NOTE — Telephone Encounter (Signed)
Called and left voicemail for patient to call office back  

## 2022-07-08 NOTE — Telephone Encounter (Signed)
PCCM:  Please let patient know I reviewed her echo results.  Within the ascending aorta she does have a mobile plaque within the aorta. I have discussed this finding with cardiology, vascular surgery and cardiothoracic surgery.  I have ordered a stat CT aortogram.  Please contact the PCC's as well as the patient and ensure that this is done today or tomorrow.  Please place a urgent referral to cardiothoracic surgery.  We can try to add her to my schedule via telephone visit.  Garner Nash, DO [facility] 07/08/2022 1:23 PM

## 2022-07-09 ENCOUNTER — Ambulatory Visit (HOSPITAL_COMMUNITY)
Admission: RE | Admit: 2022-07-09 | Discharge: 2022-07-09 | Disposition: A | Discharge status: Home / Self Care | Payer: Medicare Other | Source: Ambulatory Visit | Attending: Pulmonary Disease | Admitting: Pulmonary Disease

## 2022-07-09 ENCOUNTER — Ambulatory Visit (HOSPITAL_COMMUNITY): Payer: Medicare Other

## 2022-07-09 DIAGNOSIS — Q254 Congenital malformation of aorta unspecified: Secondary | ICD-10-CM | POA: Insufficient documentation

## 2022-07-09 DIAGNOSIS — J439 Emphysema, unspecified: Secondary | ICD-10-CM | POA: Diagnosis not present

## 2022-07-09 MED ORDER — IOHEXOL 350 MG/ML SOLN
100.0000 mL | Freq: Once | INTRAVENOUS | Status: AC | PRN
  Administered 2022-07-09: 100 mL via INTRAVENOUS

## 2022-07-12 NOTE — Progress Notes (Signed)
Tay,   Please let patient know I have reviewed her CTA. She will need to follow up with cardiothoracic surgery. She does have scattered plaque within the aorta.   I do not think she needs the appt with me on the 2nd. She needs to see surgery to discuss next steps. Keep appt with [facility] regarding pulm htn eval.   Thanks,  BLI  Garner Nash, DO [facility] 07/12/2022 8:23 AM

## 2022-07-13 NOTE — Progress Notes (Signed)
Spoke to patient about CTA results. Patient would like to discuss further with Dr Valeta Harms before making an appointment with cardiothoracic surgery. Patient has a video visit on Nov 2nd with Dr Valeta Harms.

## 2022-07-15 ENCOUNTER — Telehealth: Payer: Medicare Other | Admitting: Pulmonary Disease

## 2022-07-15 ENCOUNTER — Telehealth: Payer: Self-pay | Admitting: Pulmonary Disease

## 2022-07-16 NOTE — Telephone Encounter (Signed)
Called and left voicemail for patient to call office back to go over pulm htn and her request to change to video visit

## 2022-07-16 NOTE — Telephone Encounter (Signed)
Called and spoke to patient and went over what pulmonary htn was. And gave her a little run down of what MH would talk to her about. She verbalized understanding. Nothing further needed

## 2022-07-27 ENCOUNTER — Telehealth (INDEPENDENT_AMBULATORY_CARE_PROVIDER_SITE_OTHER): Payer: Medicare Other | Admitting: Pulmonary Disease

## 2022-07-27 DIAGNOSIS — R931 Abnormal findings on diagnostic imaging of heart and coronary circulation: Secondary | ICD-10-CM | POA: Diagnosis not present

## 2022-08-21 ENCOUNTER — Encounter: Payer: Self-pay | Admitting: Pulmonary Disease

## 2022-08-21 NOTE — Progress Notes (Signed)
Virtual Visit via Telephone Note  I connected withNAME@ on 08/21/22 at  2:00 PM EST by telephone and verified that I am speaking with the correct person using two identifiers.  Location: Patient: Home Provider: [facility] - [address], [place], [place], [place] [address]   I discussed the limitations, risks, security and privacy concerns of performing an evaluation and management service by telephone and the availability of in person appointments. I also discussed with the patient that there may be a patient responsible charge related to this service. The patient expressed understanding and agreed to proceed.  Patient consented to consult via telephone: Yes People present and their role in pt care: Pt     History of Present Illness:  71 y.o. woman whom we are seeing in consultation for evaluation of possible pulm hypertension and most recent pulmonary note from Dr. Valeta Harms reviewed.  Patient has several questions about pulmonary hypertension.  Majority of the visit we discussed the heterogeneity of pulmonary hypertension in general.  The possible contributor to pulm hypertension and her.  The role and rationale for diagnostic procedures.  The role and rationale for pulmonary vasodilators.  Questions answered to the best my ability.   Chief complaint:  Discussed pulmonary hypertension  Observations/Objective:  Social History   Tobacco Use  Smoking Status Former   Packs/day: 0.25   Years: 50.00   Total pack years: 12.50   Types: Cigarettes   Quit date: 09/13/2020   Years since quitting: 1.9  Smokeless Tobacco Never   Immunization History  Administered Date(s) Administered   Pneumococcal Polysaccharide-23 12/25/2013      Assessment and Plan:  Presumed pulmonary hypertension: Serial, 5 echocardiogram reviewed.  In 2015 elevated left atrium.  The left atrium has persisted.  Most recently 06/2022.  2018 estimated PASP was 54.  Most recent TR was trivial  unable to assess estimated PASP but generally not significantly elevated.  Discussed at length that for years she is good with this condition at present.  Given the left atrium and mild mitral valve and aortic valvular disease, she is a poor candidate for pulmonary vasodilators.  Additionally, presence of severe emphysema further limits utility pulmonary vasodilators.  Discussed that if desired right heart catheterization will be necessary to make diagnosis.  I discussed the risk and benefits agreed to not pursue this at this time.  Follow Up Instructions:  Follow-up as needed   I discussed the assessment and treatment plan with the patient. The patient was provided an opportunity to ask questions and all were answered. The patient agreed with the plan and demonstrated an understanding of the instructions.   The patient was advised to call back or seek an in-person evaluation if the symptoms worsen or if the condition fails to improve as anticipated.  I provided 23 minutes of non-face-to-face time during this encounter.   Lanier Clam, MD

## 2022-12-17 DIAGNOSIS — I10 Essential (primary) hypertension: Secondary | ICD-10-CM | POA: Diagnosis not present

## 2022-12-17 DIAGNOSIS — J449 Chronic obstructive pulmonary disease, unspecified: Secondary | ICD-10-CM | POA: Diagnosis not present

## 2022-12-17 DIAGNOSIS — Z0000 Encounter for general adult medical examination without abnormal findings: Secondary | ICD-10-CM | POA: Diagnosis not present

## 2022-12-17 DIAGNOSIS — E782 Mixed hyperlipidemia: Secondary | ICD-10-CM | POA: Diagnosis not present

## 2022-12-17 DIAGNOSIS — D696 Thrombocytopenia, unspecified: Secondary | ICD-10-CM | POA: Diagnosis not present

## 2022-12-17 DIAGNOSIS — E611 Iron deficiency: Secondary | ICD-10-CM | POA: Diagnosis not present

## 2023-01-04 ENCOUNTER — Telehealth: Payer: Self-pay | Admitting: *Deleted

## 2023-01-04 ENCOUNTER — Telehealth (INDEPENDENT_AMBULATORY_CARE_PROVIDER_SITE_OTHER): Payer: Medicare Other | Admitting: Adult Health

## 2023-01-04 DIAGNOSIS — I2721 Secondary pulmonary arterial hypertension: Secondary | ICD-10-CM | POA: Diagnosis not present

## 2023-01-04 DIAGNOSIS — I5032 Chronic diastolic (congestive) heart failure: Secondary | ICD-10-CM | POA: Diagnosis not present

## 2023-01-04 DIAGNOSIS — J449 Chronic obstructive pulmonary disease, unspecified: Secondary | ICD-10-CM

## 2023-01-04 DIAGNOSIS — J9612 Chronic respiratory failure with hypercapnia: Secondary | ICD-10-CM | POA: Diagnosis not present

## 2023-01-04 DIAGNOSIS — R931 Abnormal findings on diagnostic imaging of heart and coronary circulation: Secondary | ICD-10-CM

## 2023-01-04 DIAGNOSIS — J9611 Chronic respiratory failure with hypoxia: Secondary | ICD-10-CM

## 2023-01-04 NOTE — Progress Notes (Signed)
Virtual Visit via Video Note  I connected with [name] on 01/04/23 at  2:00 PM EDT by a video enabled telemedicine application and verified that I am speaking with the correct person using two identifiers.  Location: Patient: Home  Provider: Office    I discussed the limitations of evaluation and management by telemedicine and the availability of in person appointments. The patient expressed understanding and agreed to proceed.  History of Present Illness: 72 year old female former smoker followed for severe COPD, chronic hypoxic and hypercarbic respiratory failure on oxygen Medical history significant for hypertension, congestive heart failure pulmonary hypertension and previous breast cancer, morbid obesity s/p gastric bypass   Today's video visit is a 41-month follow-up.  Patient has underlying moderate to severe COPD and chronic hypoxic and hypercarbic respiratory failure.  Patient says she remains Breztri twice daily. Rarely uses Albuterol inhaler. No ER or hospital visits. No URI symptoms . No increased cough.   She says overall she continues to get short of breath with minimal activities.   She is on a Astral noninvasive vent at bedtime. Wears it every night for about 6-7 hr each night. Feels it helps her breathing .  NIV download shows excellent compliance with daily average usage at 7 hours.  AHI 0.2 patient is on pressure support minimum 6 max 20 cm H2O.  Backup rate 15 breaths/min EPAP minimum 5 cm H2O..  She uses a fullface mask.   - She can remains on oxygen at 2 L. No increased oxygen demands. Checks Oxygen at home , 97-98% on 2l/m .  Declines vaccines.  Lives at home . Able to do light house chores. Does not drive.   Seen by Dr. Judeth Horn in November for evaluation of pulmonary hypertension felt to be a poor candidate for pulmonary vasodilators given left mitral and aortic valvular disease and underlying emphysema.   2 D Echo 06/2022 showed mobile plaque ascending aorta  ,EF  65%, Severe MV calcification, Recommended to see Cardiothoracic surgery. She canceled appointment - does not feel she needed to go . Advised the results of Echo and reason for consult . Patient education given. .    Past Medical History:  Diagnosis Date   Abnormal weight gain 08/14/2009   Qualifier: Diagnosis of  By: Mauricio Po MD, Fayrene Fearing     Acute respiratory failure [facility])    Allergic rhinitis    Anemia    Breast cancer (HCC) 1997   s/p radiation and lumpectomy.   CAP (community acquired pneumonia) 12/2013   Chronic lower back pain    COPD (chronic obstructive pulmonary disease) (HCC)    DVT (deep venous thrombosis) (HCC) ~ 2004   LLE   Edema extremities    lower   Fatigue    Hypertension    "only when I get stressed" (09/24/2016)   Hypoalbuminemia    Hypocalcemia    Macular degeneration    Osteoarthritis    Psoriasis    Tobacco abuse        Observations/Objective: 01/04/2023 -Appears well , On O2 . Alert . No obvious increased wob.   12/2013 PFT> Ratio 46%, FEV1  1.24L (54% pred, 44% change), TLC 6.17L (127% pred), DLCO 10.82 (48% pred),    Chest imaging:   November 2016 CT chest images personally reviewed showing diffuse centrilobular emphysema Echo 07/2015 EF 55-60%, gr 1 DD . PAP 65 , LA mod dila./Mild RA dilation    Sleep study 11/2016 [facility] 1.4/hr , moderate desats -low 81%. -recs O2 At bedtime  .  2018 2D echo Systolic pressure was severely increased. PA   peak pressure: 54 mm Hg (S).  2 D Echo 2020 Mod elevated PAP 42 mmHg ,   2 D Echo 06/2022 mobile plaque ascending aorta  ,EF 65%, Severe MV calcification,  CT chest angio /aorta -advanced emphysema atherosclerotic plaque throughout the thoracic aorta and coronary arteries  Assessment and Plan: COPD currently well compensated on triple therapy maintenance inhaler.  Continue on Breztri inhaler twice daily.  Will inhaler as needed.  Declined immunizations. Most recent CT chest July 09, 2022 showed advanced  emphysema.  No suspicious pulmonary nodules.  Hypoxic and hypercarbic respiratory failure-compensated on 2 L of oxygen.  O2 saturation goals are greater than 88 to 90%..  Patient continue on noninvasive vent support at bedtime.  Astral NIV shows excellent compliance and usage.  No change in settings  Physical deconditioning-recommend continue activity as tolerated.  Pulmonary hypertension-previous evaluation with Dr. Stephani Police a candidate for pulmonary vasodilators.-Continue on oxygen and noninvasive vent support  Abnormal 2D echo with mobile plaque in the ascending aorta and severe mitral valve calcifications-referred to cardiothoracic surgery-patient canceled appointment and declined referrals.  Plan  Patient Instructions  Continue on Breztri 2 puffs twice daily, rinse after use  Continue on Astral noninvasive ventilator at bedtime .  Continue on Oxygen 2l/m , goal is to keep oxygen levels above 88 to 90% Activity as tolerated Saline nasal spray twice daily Saline nasal gel at bedtime Follow with Dr. Tonia Brooms in 6 months and As needed  -In person  Please contact office for sooner follow up if symptoms do not improve or worsen or seek emergency care      Follow Up Instructions:    I discussed the assessment and treatment plan with the patient. The patient was provided an opportunity to ask questions and all were answered. The patient agreed with the plan and demonstrated an understanding of the instructions.   The patient was advised to call back or seek an in-person evaluation if the symptoms worsen or if the condition fails to improve as anticipated.  I provided 32  minutes of non-face-to-face time during this encounter.   Rubye Oaks, NP

## 2023-01-04 NOTE — Telephone Encounter (Signed)
ATC patient x1.  LVM that I was calling to get her ready for her 2 pm video visit with Tammy and requested that she go ahead and sign on.

## 2023-01-04 NOTE — Progress Notes (Signed)
No covid vaccines, no flu vaccines.

## 2023-01-04 NOTE — Patient Instructions (Addendum)
Continue on Breztri 2 puffs twice daily, rinse after use  Continue on Astral noninvasive ventilator at bedtime .  Continue on Oxygen 2l/m , goal is to keep oxygen levels above 88 to 90% Activity as tolerated Saline nasal spray twice daily Saline nasal gel at bedtime Follow with Dr. Tonia Brooms in 6 months and As needed  -In person  Please contact office for sooner follow up if symptoms do not improve or worsen or seek emergency care

## 2023-05-31 DIAGNOSIS — E782 Mixed hyperlipidemia: Secondary | ICD-10-CM | POA: Diagnosis not present

## 2023-05-31 DIAGNOSIS — I10 Essential (primary) hypertension: Secondary | ICD-10-CM | POA: Diagnosis not present

## 2023-05-31 DIAGNOSIS — J449 Chronic obstructive pulmonary disease, unspecified: Secondary | ICD-10-CM | POA: Diagnosis not present

## 2023-08-22 ENCOUNTER — Telehealth: Payer: Self-pay | Admitting: Adult Health

## 2023-08-22 ENCOUNTER — Telehealth: Payer: Medicare Other | Admitting: Adult Health

## 2023-08-22 ENCOUNTER — Encounter: Payer: Self-pay | Admitting: Adult Health

## 2023-08-22 DIAGNOSIS — J9612 Chronic respiratory failure with hypercapnia: Secondary | ICD-10-CM

## 2023-08-22 DIAGNOSIS — J9611 Chronic respiratory failure with hypoxia: Secondary | ICD-10-CM

## 2023-08-22 DIAGNOSIS — I2721 Secondary pulmonary arterial hypertension: Secondary | ICD-10-CM

## 2023-08-22 DIAGNOSIS — J449 Chronic obstructive pulmonary disease, unspecified: Secondary | ICD-10-CM | POA: Diagnosis not present

## 2023-08-22 NOTE — Telephone Encounter (Signed)
Per TP ok to change to virtual visit- she is having transportation issue today Pt is aware that next visit must be in person Nothing further needed

## 2023-08-22 NOTE — Progress Notes (Signed)
Virtual Visit via Video Note  I connected with [name] on 08/22/23 at  2:30 PM EST by a video enabled telemedicine application and verified that I am speaking with the correct person using two identifiers.  Location: Patient: Home  Provider: Office    I discussed the limitations of evaluation and management by telemedicine and the availability of in person appointments. The patient expressed understanding and agreed to proceed.  History of Present Illness: 72 yo female former smoker followed for severe COPD, Chronic hypoxic and hypercarbic respiratory failure on O2.  Medical history significant for failure, pulmonary hypertension, breast cancer, morbid obesity status post gastric bypass.   Attempted video visit multiple times unable to connect with video. Completed the visit over phone.   Today's video visit is a 15-month follow-up.  Patient is followed for severe COPD, chronic hypoxic and hypercarbic respiratory failure on oxygen and nocturnal  NIV .  She remains on Breztri twice daily.  Says overall breathing is doing okay.  She has good days and bad days.  Gets winded with heavy activities.  She denies any flare of cough or wheezing. No increased albuterol use.  Getting Breztri through patient assistance.  She is on Astral Noninvasive Vent  at bedtime typically uses about 7-8  hours each night.  Download shows excellent compliance with daily average usage at 7 hours. She is on Chronic oxygen at 2 L.  She checks her oxygen levels at home and they have been above 90% on oxygen. She declines vaccines.  Seen by Dr. Judeth Horn in November 2023 for evaluation of pulmonary hypertension felt to be a poor candidate for pulmonary vasodilators given left mitral and aortic valvular disease and underlying emphysema.   Past Medical History:  Diagnosis Date   Abnormal weight gain 08/14/2009   Qualifier: Diagnosis of  By: Mauricio Po MD, Fayrene Fearing     Acute respiratory failure [facility])    Allergic rhinitis     Anemia    Breast cancer (HCC) 1997   s/p radiation and lumpectomy.   CAP (community acquired pneumonia) 12/2013   Chronic lower back pain    COPD (chronic obstructive pulmonary disease) (HCC)    DVT (deep venous thrombosis) (HCC) ~ 2004   LLE   Edema extremities    lower   Fatigue    Hypertension    "only when I get stressed" (09/24/2016)   Hypoalbuminemia    Hypocalcemia    Macular degeneration    Osteoarthritis    Psoriasis    Tobacco abuse    Current Outpatient Medications on File Prior to Visit  Medication Sig Dispense Refill   albuterol (VENTOLIN HFA) 108 (90 Base) MCG/ACT inhaler Inhale 2 puffs into the lungs every 6 (six) hours as needed. 8 g 0   amLODipine (NORVASC) 5 MG tablet Take 5 mg by mouth daily.     Ascorbic Acid (VITAMIN C) 1000 MG tablet Take 1,000 mg by mouth 3 (three) times daily as needed.     aspirin 81 MG chewable tablet Chew 1 tablet (81 mg total) by mouth daily. 30 tablet 0   Budeson-Glycopyrrol-Formoterol (BREZTRI AEROSPHERE) 160-9-4.8 MCG/ACT AERO Inhale 2 puffs into the lungs in the morning and at bedtime. 32.1 g 3   losartan (COZAAR) 50 MG tablet Take 1 tablet by mouth once daily 90 tablet 0   Multiple Vitamin (MULTIVITAMIN WITH MINERALS) TABS tablet Take 1 tablet by mouth daily.     oxyCODONE (ROXICODONE) 5 MG immediate release tablet Take 1 tablet (5 mg total)  by mouth every 8 (eight) hours as needed for breakthrough pain. (Patient not taking: Reported on 01/04/2023) 15 tablet 0   No current facility-administered medications on file prior to visit.      Observations/Objective: 08/22/2023  Home weight -146.2lb  Home b/p 142/66 , HR 72  Sounds well on phone, no audible wheezing.     12/2013 PFT> Ratio 46%, FEV1  1.24L (54% pred, 44% change), TLC 6.17L (127% pred), DLCO 10.82 (48% pred),    Chest imaging:   November 2016 CT chest images personally reviewed showing diffuse centrilobular emphysema Echo 07/2015 EF 55-60%, gr 1 DD . PAP 65 , LA  mod dila./Mild RA dilation    Sleep study 11/2016 [facility] 1.4/hr , moderate desats -low 81%. -recs O2 At bedtime  .     2018 2D echo Systolic pressure was severely increased. PA   peak pressure: 54 mm Hg (S).   2 D Echo 2020 Mod elevated PAP 42 mmHg ,   2 D Echo 06/2022 mobile plaque ascending aorta  ,EF 65%, Severe MV calcification, -referred to cardiothoracic surgery-patient canceled appointment and declined referrals.   CT chest angio /aorta -advanced emphysema atherosclerotic plaque throughout the thoracic aorta and coronary arteries   Assessment and Plan: COPD with emphysema appears stable.  Continue on triple therapy inhaler with Breztri twice daily.  Albuterol as needed.  Declines immunizations.  CT chest October 2023 showed advanced emphysema with no suspicious nodules.  Will check chest x-ray on return visit.  Patient to follow-up in 3 months  Hypoxic and hypercarbic respiratory failure compensated on oxygen 2 L.  Continue on noninvasive ventilator at bedtime.  She has excellent control and perceived benefit.  Pulmonary hypertension previous evaluation with Dr. Judeth Horn felt to not be a candidate for pulmonary vasodilators.  Continue on oxygen and noninvasive ventilator support.  Abnormal 2D echo 2023 that showed possible mobile plaque in the ascending aorta and severe mitral valve calcifications.  She declined cardiothoracic surgery referral.    Plan Patient Instructions  Continue on Breztri 2 puffs twice daily, rinse after use  Continue on Astral noninvasive ventilator at bedtime .  Continue on Oxygen 2l/m , goal is to keep oxygen levels above 88 to 90% Activity as tolerated Saline nasal spray twice daily Saline nasal gel at bedtime Follow with Dr. Tonia Brooms in 3 months and As needed  -In person  Please contact office for sooner follow up if symptoms do not improve or worsen or seek emergency care       Follow Up Instructions: Follow-up in 3 months   I discussed the  assessment and treatment plan with the patient. The patient was provided an opportunity to ask questions and all were answered. The patient agreed with the plan and demonstrated an understanding of the instructions.   The patient was advised to call back or seek an in-person evaluation if the symptoms worsen or if the condition fails to improve as anticipated.  I provided 30  minutes of non-face-to-face time during this encounter.   Rubye Oaks, NP

## 2023-08-22 NOTE — Patient Instructions (Signed)
Continue on Breztri 2 puffs twice daily, rinse after use  Continue on Astral noninvasive ventilator at bedtime .  Continue on Oxygen 2l/m , goal is to keep oxygen levels above 88 to 90% Activity as tolerated Saline nasal spray twice daily Saline nasal gel at bedtime Follow with Dr. Tonia Brooms in 3 months and As needed  -In person  Please contact office for sooner follow up if symptoms do not improve or worsen or seek emergency care

## 2023-08-22 NOTE — Telephone Encounter (Signed)
Patient would like to do a mychart video visit today appointment with Rubye Oaks NP. Patient phone number is [phone].

## 2023-12-22 ENCOUNTER — Other Ambulatory Visit (HOSPITAL_BASED_OUTPATIENT_CLINIC_OR_DEPARTMENT_OTHER): Payer: Self-pay

## 2023-12-22 ENCOUNTER — Other Ambulatory Visit: Payer: Self-pay | Admitting: Adult Health

## 2023-12-22 NOTE — Telephone Encounter (Signed)
 Last Fill: 06/28/22  Last OV: 08/22/23 Next OV: None Scheduled  Routing to provider for review/authorization.

## 2023-12-22 NOTE — Telephone Encounter (Signed)
 Copied from CRM [id number]. Topic: Clinical - Medication Refill >> Dec 22, 2023  1:24 PM Nila Nephew wrote: Most Recent Primary Care Visit:   Medication: Budeson-Glycopyrrol-Formoterol (BREZTRI AEROSPHERE) 160-9-4.8 MCG/ACT AERO  Has the patient contacted their pharmacy? Yes - States new prescription is needed  Is this the correct pharmacy for this prescription? Yes This is the patient's preferred pharmacy:  [facility] [place], [place] Fax: [phone]  Has the prescription been filled recently? No  Is the patient out of the medication? Yes  Has the patient been seen for an appointment in the last year OR does the patient have an upcoming appointment? No  Can we respond through MyChart? Yes  Agent: Please be advised that Rx refills may take up to 3 business days. We ask that you follow-up with your pharmacy.

## 2024-01-06 NOTE — Telephone Encounter (Signed)
 Looks like this will have to be on a patient assistance form, will you do this one and have tammy sign for it. As far as I can tell she's enrolled

## 2024-01-10 DIAGNOSIS — M6740 Ganglion, unspecified site: Secondary | ICD-10-CM | POA: Diagnosis not present

## 2024-01-10 DIAGNOSIS — E611 Iron deficiency: Secondary | ICD-10-CM | POA: Diagnosis not present

## 2024-01-10 DIAGNOSIS — I10 Essential (primary) hypertension: Secondary | ICD-10-CM | POA: Diagnosis not present

## 2024-01-10 DIAGNOSIS — Z0000 Encounter for general adult medical examination without abnormal findings: Secondary | ICD-10-CM | POA: Diagnosis not present

## 2024-01-10 DIAGNOSIS — E782 Mixed hyperlipidemia: Secondary | ICD-10-CM | POA: Diagnosis not present

## 2024-01-10 DIAGNOSIS — Z6824 Body mass index (BMI) 24.0-24.9, adult: Secondary | ICD-10-CM | POA: Diagnosis not present

## 2024-01-10 DIAGNOSIS — D696 Thrombocytopenia, unspecified: Secondary | ICD-10-CM | POA: Diagnosis not present

## 2024-01-10 DIAGNOSIS — J449 Chronic obstructive pulmonary disease, unspecified: Secondary | ICD-10-CM | POA: Diagnosis not present

## 2024-01-30 ENCOUNTER — Other Ambulatory Visit: Payer: Self-pay | Admitting: Adult Health

## 2024-01-30 MED ORDER — ALBUTEROL SULFATE HFA 108 (90 BASE) MCG/ACT IN AERS: 2.0000 | INHALATION_SPRAY | Freq: Four times a day (QID) | RESPIRATORY_TRACT | 0 refills | Status: DC | PRN

## 2024-01-30 NOTE — Telephone Encounter (Signed)
 Copied from CRM [id number]. Topic: Clinical - Medication Refill >> Jan 30, 2024  1:53 PM Crist Dominion wrote: Medication: albuterol  (VENTOLIN  HFA) 108 (90 Base) MCG/ACT inhaler  Has the patient contacted their pharmacy? No, out of refills.  (Agent: If no, request that the patient contact the pharmacy for the refill. If patient does not wish to contact the pharmacy document the reason why and proceed with request.) (Agent: If yes, when and what did the pharmacy advise?)  This is the patient's preferred pharmacy:  [facility] [address] [place] - [address] [place] [address] Phone: [phone] Fax: [phone]    Is this the correct pharmacy for this prescription? Yes If no, delete pharmacy and type the correct one.   Has the prescription been filled recently? No  Is the patient out of the medication? Yes  Has the patient been seen for an appointment in the last year OR does the patient have an upcoming appointment? Yes  Can we respond through MyChart? No   Agent: Please be advised that Rx refills may take up to 3 business days. We ask that you follow-up with your pharmacy.

## 2024-01-31 ENCOUNTER — Telehealth (HOSPITAL_BASED_OUTPATIENT_CLINIC_OR_DEPARTMENT_OTHER): Payer: Self-pay

## 2024-01-31 NOTE — Telephone Encounter (Signed)
 Copied from CRM [id number]. Topic: Clinical - Prescription Issue >> Jan 30, 2024  1:56 PM Crist Dominion wrote: Reason for CRM: Patient states she has been waiting over a month for Tammy Parrett to fax a new prescription to [employer] for her Budeson-Glycopyrrol-Formoterol  (BREZTRI  AEROSPHERE) 160-9-4.8 MCG/ACT AERO - Please fax prescription to [phone]  Patient states she has 10 days left and is worried because this needs to be processed and shipped to her house. Patient is requesting a call when this is complete.

## 2024-02-07 ENCOUNTER — Telehealth: Payer: Self-pay | Admitting: *Deleted

## 2024-02-07 MED ORDER — BREZTRI AEROSPHERE 160-9-4.8 MCG/ACT IN AERO: 2.0000 | INHALATION_SPRAY | Freq: Two times a day (BID) | RESPIRATORY_TRACT | Status: AC

## 2024-02-07 MED ORDER — BREZTRI AEROSPHERE 160-9-4.8 MCG/ACT IN AERO: 2.0000 | INHALATION_SPRAY | Freq: Two times a day (BID) | RESPIRATORY_TRACT | 3 refills | Status: AC

## 2024-02-07 NOTE — Telephone Encounter (Signed)
 Copied from CRM [id number]. Topic: Clinical - Prescription Issue >> Jan 30, 2024  1:56 PM Crist Dominion wrote: Reason for CRM: Patient states she has been waiting over a month for Tammy Parrett to fax a new prescription to [employer] for her Budeson-Glycopyrrol-Formoterol  (BREZTRI  AEROSPHERE) 160-9-4.8 MCG/ACT AERO - Please fax prescription to [phone]  Patient states she has 10 days left and is worried because this needs to be processed and shipped to her house. Patient is requesting a call when this is complete.   I called and spoke with the pt  I have left samples of breztri  for her to pick up and refilled breztri - sent to [facility].

## 2024-02-07 NOTE — Telephone Encounter (Signed)
 Copied from CRM [id number]. Topic: Clinical - Prescription Issue >> Jan 30, 2024  1:56 PM Crist Dominion wrote: Reason for CRM: Patient states she has been waiting over a month for Tammy Parrett to fax a new prescription to [employer] for her Budeson-Glycopyrrol-Formoterol  (BREZTRI  AEROSPHERE) 160-9-4.8 MCG/ACT AERO - Please fax prescription to [phone]  Patient states she has 10 days left and is worried because this needs to be processed and shipped to her house. Patient is requesting a call when this is complete. >> Feb 07, 2024  1:11 PM Crist Dominion wrote: Patient is requesting an update on this as she only has 2 days worth of medication left and no one has called her. Patient is requesting someone to call her as soon the prescription is sent as well as to discuss possible Breztri  samples.   Duplicate

## 2024-02-13 ENCOUNTER — Other Ambulatory Visit: Payer: Self-pay | Admitting: *Deleted

## 2024-02-13 ENCOUNTER — Telehealth: Payer: Self-pay | Admitting: *Deleted

## 2024-02-13 MED ORDER — BREZTRI AEROSPHERE 160-9-4.8 MCG/ACT IN AERO: 2.0000 | INHALATION_SPRAY | Freq: Two times a day (BID) | RESPIRATORY_TRACT | 3 refills | Status: AC

## 2024-02-13 NOTE — Telephone Encounter (Signed)
 Prescription printed for Tammy's signature for patient assistance for Breztri .

## 2024-02-22 ENCOUNTER — Other Ambulatory Visit: Payer: Self-pay | Admitting: Adult Health

## 2024-02-23 ENCOUNTER — Telehealth: Payer: Self-pay | Admitting: *Deleted

## 2024-02-23 NOTE — Telephone Encounter (Signed)
 Received request for new prescription for Breztri  from [employer] patient assistance.  New prescription faxed to [phone], received fax confirmation that fax had been sent successfully.  Nothing further needed.

## 2024-03-08 NOTE — Telephone Encounter (Signed)
 I called [employer] patient assistance and verified they received the prescription for Breztri  on June 2nd.  Nothing further needed.

## 2024-05-18 ENCOUNTER — Encounter: Payer: Self-pay | Admitting: Adult Health

## 2024-05-18 ENCOUNTER — Telehealth: Payer: Self-pay | Admitting: Adult Health

## 2024-05-18 ENCOUNTER — Telehealth: Admitting: Adult Health

## 2024-05-18 DIAGNOSIS — J9612 Chronic respiratory failure with hypercapnia: Secondary | ICD-10-CM | POA: Diagnosis not present

## 2024-05-18 DIAGNOSIS — J449 Chronic obstructive pulmonary disease, unspecified: Secondary | ICD-10-CM | POA: Diagnosis not present

## 2024-05-18 DIAGNOSIS — J9611 Chronic respiratory failure with hypoxia: Secondary | ICD-10-CM | POA: Diagnosis not present

## 2024-05-18 DIAGNOSIS — Z87891 Personal history of nicotine dependence: Secondary | ICD-10-CM

## 2024-05-18 MED ORDER — ALBUTEROL SULFATE HFA 108 (90 BASE) MCG/ACT IN AERS: 1.0000 | INHALATION_SPRAY | Freq: Four times a day (QID) | RESPIRATORY_TRACT | 5 refills | Status: AC | PRN

## 2024-05-18 NOTE — Progress Notes (Signed)
 Virtual Visit via Video Note  I connected with [name] on 05/18/24 at  4:00 PM EDT by a video enabled telemedicine application and verified that I am speaking with the correct person using two identifiers.  Location: Patient: Home  Provider: Office    I discussed the limitations of evaluation and management by telemedicine and the availability of in person appointments. The patient expressed understanding and agreed to proceed.  History of Present Illness: 73 year old female former smoker followed for severe COPD, chronic hypoxic and hypercarbic respiratory failure on home oxygen  and noninvasive ventilator (astral vent Medical history significant for pulmonary hypertension, breast [phone]), morbid obesity status post gastric bypass Today's video visit is a follow-up visit.  Last seen December 2024.  Patient says overall breathing is doing okay.  She gets short of breath with activities has to pace her time.  She remains on Breztri  twice daily.  Rarely uses her albuterol  does not need a refill sent to the pharmacy.  Denies any hemoptysis. She has hypoxic and hypercarbic respiratory failure.  She is on oxygen  3 L 24/7.  She does use the astral noninvasive ventilator at bedtime.  Says she wears it every single night typically gets in about 5 to 6 hours.  Download was requested.  She feels that she benefits from this.  Does not nap on a regular basis. Says she is under quite a bit of stress.  Her 76 year old mother lives with her at home.  Is not in the greatest health.  Her brother also stays with her and her mom which also helps her with her medical care.  Unfortunately her daughter was recently diagnosed with stage I breast cancer and is getting ready to start radiation .  Gets Breztri  through the patient assistance program.  Declines vaccines   Observations/Objective: B/p home check -134/64, HR 92bpm, Wt 133lbs.  Appears well no acute distress on oxygen   Assessment and Plan: Severe  COPD-continue on current regimen with Breztri  twice daily.  Albuterol  as needed.  Inhaler care discussed.  Chronic hypoxic and hypercarbic respiratory failure continue on astral noninvasive ventilator at bedtime and with naps Continue on oxygen  3 L to maintain O2 saturations greater than 88 to 90% NIV download requested  Plan  Patient Instructions  Continue on Breztri  2 puffs twice daily, rinse after use  Albuterol  inhaler As needed   Continue on Astral noninvasive ventilator at bedtime and naps .  Continue on Oxygen  3l/m , goal is to keep oxygen  levels above 88 to 90% Activity as tolerated Saline nasal spray twice daily Saline nasal gel at bedtime Follow with Dr. Kara or Isaac Dubie NP (30 min) 3 months and As needed   Please contact office for sooner follow up if symptoms do not improve or worsen or seek emergency care      Follow Up Instructions:    I discussed the assessment and treatment plan with the patient. The patient was provided an opportunity to ask questions and all were answered. The patient agreed with the plan and demonstrated an understanding of the instructions.   The patient was advised to call back or seek an in-person evaluation if the symptoms worsen or if the condition fails to improve as anticipated.  I provided 21  minutes of non-face-to-face time during this encounter.   Madelin Stank, NP

## 2024-05-18 NOTE — Patient Instructions (Addendum)
 Continue on Breztri  2 puffs twice daily, rinse after use  Albuterol  inhaler As needed   Continue on Astral noninvasive ventilator at bedtime and naps .  Continue on Oxygen  3l/m , goal is to keep oxygen  levels above 88 to 90% Activity as tolerated Saline nasal spray twice daily Saline nasal gel at bedtime Follow with Dr. Kara or Karo Rog NP (30 min) 3 months and As needed   Please contact office for sooner follow up if symptoms do not improve or worsen or seek emergency care

## 2024-05-18 NOTE — Telephone Encounter (Signed)
 Please cancel Oct ov with Laniesha Das  Set up follow up in Dec -please call to set up

## 2024-05-21 NOTE — Telephone Encounter (Signed)
 ATC patient x1.  LVM for patient to return call to schedule appointment in December with Dr. Kara instead of in October as previously scheduled.  The October appointment has already been cancelled.  She needs an appointment in December with Dr. Kara.  Mychart message sent as well.

## 2024-06-04 ENCOUNTER — Telehealth: Payer: Self-pay

## 2024-06-04 NOTE — Telephone Encounter (Signed)
 Tried to reach out to patient to schedule an appointment with Dr. Kara in December and cancel up coming  appointment  with [facility].

## 2024-06-18 ENCOUNTER — Ambulatory Visit: Admitting: Adult Health
